# Patient Record
Sex: Male | Born: 1935 | Race: White | Hispanic: No | Marital: Married | Smoking: Former smoker
Health system: Southern US, Community
[De-identification: ages and names within clinical notes are randomized; demographics above are authoritative.]

## PROBLEM LIST (undated history)

## (undated) DIAGNOSIS — I10 Essential (primary) hypertension: Secondary | ICD-10-CM

## (undated) DIAGNOSIS — I219 Acute myocardial infarction, unspecified: Secondary | ICD-10-CM

## (undated) DIAGNOSIS — E7800 Pure hypercholesterolemia, unspecified: Secondary | ICD-10-CM

## (undated) DIAGNOSIS — C61 Malignant neoplasm of prostate: Secondary | ICD-10-CM

## (undated) DIAGNOSIS — D649 Anemia, unspecified: Secondary | ICD-10-CM

## (undated) DIAGNOSIS — C189 Malignant neoplasm of colon, unspecified: Secondary | ICD-10-CM

## (undated) DIAGNOSIS — M1990 Unspecified osteoarthritis, unspecified site: Secondary | ICD-10-CM

## (undated) DIAGNOSIS — C3490 Malignant neoplasm of unspecified part of unspecified bronchus or lung: Secondary | ICD-10-CM

## (undated) DIAGNOSIS — I509 Heart failure, unspecified: Secondary | ICD-10-CM

## (undated) HISTORY — PX: COLON SURGERY: SHX602

## (undated) HISTORY — PX: CORONARY ANGIOPLASTY WITH STENT PLACEMENT: SHX49

## (undated) HISTORY — PX: OTHER SURGICAL HISTORY: SHX169

## (undated) HISTORY — DX: Malignant neoplasm of unspecified part of unspecified bronchus or lung: C34.90

---

## 2004-08-20 ENCOUNTER — Other Ambulatory Visit: Payer: Self-pay

## 2004-08-20 ENCOUNTER — Emergency Department: Payer: Self-pay | Admitting: Internal Medicine

## 2004-09-29 ENCOUNTER — Encounter: Payer: Self-pay | Admitting: Cardiology

## 2004-10-16 ENCOUNTER — Encounter: Payer: Self-pay | Admitting: Cardiology

## 2004-11-15 ENCOUNTER — Encounter: Payer: Self-pay | Admitting: Cardiology

## 2004-12-16 ENCOUNTER — Encounter: Payer: Self-pay | Admitting: Cardiology

## 2005-05-04 ENCOUNTER — Ambulatory Visit: Payer: Self-pay | Admitting: Unknown Physician Specialty

## 2005-09-14 ENCOUNTER — Ambulatory Visit: Payer: Self-pay | Admitting: Internal Medicine

## 2005-11-02 ENCOUNTER — Ambulatory Visit: Payer: Self-pay | Admitting: Internal Medicine

## 2007-05-01 ENCOUNTER — Other Ambulatory Visit: Payer: Self-pay

## 2007-05-01 ENCOUNTER — Ambulatory Visit: Payer: Self-pay | Admitting: General Surgery

## 2007-05-05 ENCOUNTER — Ambulatory Visit: Payer: Self-pay | Admitting: General Surgery

## 2008-03-26 ENCOUNTER — Ambulatory Visit: Payer: Self-pay | Admitting: Unknown Physician Specialty

## 2008-09-03 ENCOUNTER — Ambulatory Visit: Payer: Self-pay | Admitting: Internal Medicine

## 2008-11-18 ENCOUNTER — Ambulatory Visit: Payer: Self-pay | Admitting: Vascular Surgery

## 2008-11-28 ENCOUNTER — Ambulatory Visit: Payer: Self-pay | Admitting: Vascular Surgery

## 2008-12-04 ENCOUNTER — Inpatient Hospital Stay: Payer: Self-pay | Admitting: Vascular Surgery

## 2009-01-12 ENCOUNTER — Emergency Department: Payer: Self-pay | Admitting: Unknown Physician Specialty

## 2012-01-21 ENCOUNTER — Ambulatory Visit: Payer: Self-pay | Admitting: Cardiology

## 2012-08-10 ENCOUNTER — Ambulatory Visit: Payer: Self-pay | Admitting: Cardiology

## 2013-01-18 ENCOUNTER — Ambulatory Visit: Payer: Self-pay | Admitting: Internal Medicine

## 2013-03-08 ENCOUNTER — Ambulatory Visit: Payer: Self-pay | Admitting: Specialist

## 2013-03-22 ENCOUNTER — Ambulatory Visit: Payer: Self-pay | Admitting: Cardiothoracic Surgery

## 2013-04-15 ENCOUNTER — Ambulatory Visit: Payer: Self-pay | Admitting: Cardiothoracic Surgery

## 2013-05-28 ENCOUNTER — Ambulatory Visit: Payer: Self-pay | Admitting: Internal Medicine

## 2013-06-13 ENCOUNTER — Ambulatory Visit: Payer: Self-pay | Admitting: Specialist

## 2013-06-21 ENCOUNTER — Ambulatory Visit: Payer: Self-pay | Admitting: Specialist

## 2013-10-20 ENCOUNTER — Observation Stay: Payer: Self-pay

## 2013-10-20 LAB — BASIC METABOLIC PANEL
Anion Gap: 7 (ref 7–16)
BUN: 20 mg/dL — AB (ref 7–18)
Calcium, Total: 9.3 mg/dL (ref 8.5–10.1)
Chloride: 105 mmol/L (ref 98–107)
Co2: 25 mmol/L (ref 21–32)
Creatinine: 1.1 mg/dL (ref 0.60–1.30)
EGFR (Non-African Amer.): >60
Glucose: 136 mg/dL — ABNORMAL HIGH (ref 65–99)
Osmolality: 279 (ref 275–301)
POTASSIUM: 4.5 mmol/L (ref 3.5–5.1)
Sodium: 137 mmol/L (ref 136–145)

## 2013-10-20 LAB — URINALYSIS, COMPLETE
BACTERIA: NONE SEEN
Bilirubin,UR: NEGATIVE
Blood: NEGATIVE
Glucose,UR: 50 mg/dL (ref 0–75)
Ketone: NEGATIVE
Leukocyte Esterase: NEGATIVE
NITRITE: NEGATIVE
Ph: 6 (ref 4.5–8.0)
Protein: 30
RBC,UR: 1 /HPF (ref 0–5)
Specific Gravity: 1.025 (ref 1.003–1.030)
Squamous Epithelial: 1
WBC UR: 2 /HPF (ref 0–5)

## 2013-10-20 LAB — CBC WITH DIFFERENTIAL/PLATELET
BASOS ABS: 0 10*3/uL (ref 0.0–0.1)
Basophil %: 0.7 %
Eosinophil #: 0.1 10*3/uL (ref 0.0–0.7)
Eosinophil %: 1.8 %
HCT: 46.3 % (ref 40.0–52.0)
HGB: 15.5 g/dL (ref 13.0–18.0)
LYMPHS ABS: 1.2 10*3/uL (ref 1.0–3.6)
Lymphocyte %: 17.2 %
MCH: 33.5 pg (ref 26.0–34.0)
MCHC: 33.5 g/dL (ref 32.0–36.0)
MCV: 100 fL (ref 80–100)
MONOS PCT: 10.7 %
Monocyte #: 0.8 x10 3/mm (ref 0.2–1.0)
NEUTROS ABS: 5 10*3/uL (ref 1.4–6.5)
Neutrophil %: 69.6 %
PLATELETS: 203 10*3/uL (ref 150–440)
RBC: 4.63 10*6/uL (ref 4.40–5.90)
RDW: 12.9 % (ref 11.5–14.5)
WBC: 7.1 10*3/uL (ref 3.8–10.6)

## 2013-10-20 LAB — CK TOTAL AND CKMB (NOT AT ARMC)
CK, TOTAL: 92 U/L
CK, TOTAL: 53 U/L
CK, Total: 54 U/L
CK-MB: 2.2 ng/mL (ref 0.5–3.6)
CK-MB: 2.4 ng/mL (ref 0.5–3.6)
CK-MB: 2.7 ng/mL (ref 0.5–3.6)

## 2013-10-20 LAB — TROPONIN I
Troponin-I: <0.02 ng/mL
Troponin-I: 0.02 ng/mL

## 2013-10-21 LAB — BASIC METABOLIC PANEL
ANION GAP: 8 (ref 7–16)
BUN: 21 mg/dL — ABNORMAL HIGH (ref 7–18)
CALCIUM: 8.5 mg/dL (ref 8.5–10.1)
Chloride: 108 mmol/L — ABNORMAL HIGH (ref 98–107)
Co2: 26 mmol/L (ref 21–32)
Creatinine: 1.3 mg/dL (ref 0.60–1.30)
EGFR (Non-African Amer.): 50 — ABNORMAL LOW
GFR CALC AF AMER: 58 — AB
Glucose: 92 mg/dL (ref 65–99)
OSMOLALITY: 286 (ref 275–301)
Potassium: 3.4 mmol/L — ABNORMAL LOW (ref 3.5–5.1)
SODIUM: 142 mmol/L (ref 136–145)

## 2013-10-21 LAB — LIPID PANEL
CHOLESTEROL: 92 mg/dL (ref 0–200)
HDL Cholesterol: 28 mg/dL — ABNORMAL LOW (ref 40–60)
Ldl Cholesterol, Calc: 43 mg/dL (ref 0–100)
Triglycerides: 104 mg/dL (ref 0–200)
VLDL Cholesterol, Calc: 21 mg/dL (ref 5–40)

## 2013-10-21 LAB — TSH: Thyroid Stimulating Horm: 0.435 u[IU]/mL — ABNORMAL LOW

## 2013-10-21 LAB — HEMOGLOBIN A1C: HEMOGLOBIN A1C: 6.3 % (ref 4.2–6.3)

## 2013-11-28 ENCOUNTER — Ambulatory Visit: Payer: Self-pay | Admitting: Specialist

## 2013-12-24 ENCOUNTER — Ambulatory Visit: Payer: Self-pay | Admitting: Vascular Surgery

## 2014-01-17 ENCOUNTER — Ambulatory Visit: Payer: Self-pay | Admitting: Cardiothoracic Surgery

## 2014-01-23 ENCOUNTER — Ambulatory Visit: Payer: Self-pay | Admitting: Cardiothoracic Surgery

## 2014-01-24 LAB — COMPREHENSIVE METABOLIC PANEL
ALK PHOS: 73 U/L
Albumin: 3.5 g/dL (ref 3.4–5.0)
Anion Gap: 10 (ref 7–16)
BUN: 24 mg/dL — AB (ref 7–18)
Bilirubin,Total: 0.6 mg/dL (ref 0.2–1.0)
Calcium, Total: 8.9 mg/dL (ref 8.5–10.1)
Chloride: 106 mmol/L (ref 98–107)
Co2: 27 mmol/L (ref 21–32)
Creatinine: 1.19 mg/dL (ref 0.60–1.30)
EGFR (African American): >60
EGFR (Non-African Amer.): >60
Glucose: 105 mg/dL — ABNORMAL HIGH (ref 65–99)
Osmolality: 289 (ref 275–301)
Potassium: 3.9 mmol/L (ref 3.5–5.1)
SGOT(AST): 19 U/L (ref 15–37)
SGPT (ALT): 25 U/L
Sodium: 143 mmol/L (ref 136–145)
Total Protein: 6.8 g/dL (ref 6.4–8.2)

## 2014-01-24 LAB — CBC CANCER CENTER
Basophil #: 0.1 x10 3/mm (ref 0.0–0.1)
Basophil %: 1 %
EOS ABS: 0.3 x10 3/mm (ref 0.0–0.7)
Eosinophil %: 3.9 %
HCT: 42.7 % (ref 40.0–52.0)
HGB: 14.3 g/dL (ref 13.0–18.0)
LYMPHS ABS: 1.5 x10 3/mm (ref 1.0–3.6)
Lymphocyte %: 21 %
MCH: 33.1 pg (ref 26.0–34.0)
MCHC: 33.5 g/dL (ref 32.0–36.0)
MCV: 99 fL (ref 80–100)
Monocyte #: 0.8 x10 3/mm (ref 0.2–1.0)
Monocyte %: 12 %
NEUTROS ABS: 4.4 x10 3/mm (ref 1.4–6.5)
Neutrophil %: 62.1 %
PLATELETS: 218 x10 3/mm (ref 150–440)
RBC: 4.33 10*6/uL — ABNORMAL LOW (ref 4.40–5.90)
RDW: 13.2 % (ref 11.5–14.5)
WBC: 7 x10 3/mm (ref 3.8–10.6)

## 2014-01-24 LAB — APTT: Activated PTT: 43.2 secs — ABNORMAL HIGH (ref 23.6–35.9)

## 2014-01-24 LAB — PROTIME-INR
INR: 1
Prothrombin Time: 13.5 secs (ref 11.5–14.7)

## 2014-01-31 ENCOUNTER — Ambulatory Visit: Payer: Self-pay | Admitting: Cardiothoracic Surgery

## 2014-02-15 ENCOUNTER — Ambulatory Visit: Payer: Self-pay | Admitting: Cardiothoracic Surgery

## 2014-03-18 ENCOUNTER — Ambulatory Visit: Payer: Self-pay | Admitting: Internal Medicine

## 2014-03-18 ENCOUNTER — Ambulatory Visit: Payer: Self-pay | Admitting: Cardiothoracic Surgery

## 2014-04-16 ENCOUNTER — Ambulatory Visit
Admit: 2014-04-16 | Disposition: A | Discharge status: Home / Self Care | Payer: Self-pay | Attending: Cardiothoracic Surgery | Admitting: Cardiothoracic Surgery

## 2014-06-08 NOTE — H&P (Signed)
PATIENT NAME:  Phillip Sanchez, DISANO MR#:  [id number] DATE OF BIRTH:  1927/08/03  DATE OF ADMISSION:  10/20/2013  PRIMARY CARE PROVIDER: Dr. Doy Hutching.   EMERGENCY DEPARTMENT REFERRING PHYSICIAN: Dr. Benjaman Lobe.   CHIEF COMPLAINT: Nausea, vomiting, dizziness, near syncope.   HISTORY OF PRESENT ILLNESS: The patient is an 81 year old white male with history of hypertension, hyperlipidemia, history of anemia, history of coronary artery disease, status post stent in 2006 who states that earlier this morning he got up and had some orange juice and subsequently started feeling very sick. He started feeling weak and then started seeing "stars." He also felt like he was going to pass out but he did not pass out. He states that during his previous heart attack he had similar symptoms, except this time he did not have chest pain. The patient came to the ED and received Zofran x 1 and then after that he started feeling better. Now he does not have any nausea or vomiting. He denies any chest pain or shortness of breath. Prior to this episode today, he has been feeling well without any chest pain, shortness of breath. No palpitations. No syncope. No nausea, vomiting, diarrhea. No urinary symptoms.   PAST MEDICAL HISTORY: Significant for:  1.  Hypertension.  2.  Hyperlipidemia.  3.  History of pulmonary nodule.  4.  History of anemia.  5.  History of osteoarthritis.  6.  Coronary artery disease with MI with stent to 2006. Has apparently a chronic occlusion that was not able to be stented.  7.  History of colon cancer status post resection.   PAST SURGICAL HISTORY:  1.  Status post ruptured disk surgery x 2.  2.  Carotid endarterectomy of the left status post hernia surgery.  3.  Status post colon resection.  4.  History of prostate surgery.   ALLERGIES: None.   CURRENT MEDICATIONS AT HOME: He is on Zyrtec 10 daily, ropinirole 1 mg at bedtime, ranitidine 300 daily, Plavix 75 p.o. daily, Peri-Colace 1 tab p.o.  b.i.d., nitroglycerin 0.4 sublingual p.r.n., Lotrel 10/40 one tab p.o. daily, hydrochlorothiazide 25 p.o. daily, Crestor 10 daily, Centrum Silver 1 tab p.o. daily, carvedilol 25 mg 1 tab p.o. b.i.d., calcium carbonate 600 one tab p.o. daily, aspirin 81 mg 1 tab p.o. daily, Ambien 10 at bedtime.   SOCIAL HISTORY: A history of smoking, quit 30 years ago. No alcohol or drug use.   FAMILY HISTORY: Positive for hypertension.   REVIEW OF SYSTEMS: CONSTITUTIONAL: Denies any fevers. Was weak earlier. No weight loss or weight gain.  EYES: No blurred or double vision. No redness. No inflammation.  ENT: No tinnitus. No ear pain. No hearing loss. No seasonal or year-round allergies. No epistaxis. No difficulty swallowing.  RESPIRATORY: Denies any coughing, wheezing. No hemoptysis. No COPD. No TB.  CARDIOVASCULAR: Denies any chest pain, orthopnea, edema. No palpitation. No syncope.  GASTROINTESTINAL: Was nauseous and throwing up earlier, but none now. No abdominal pain. No hematemesis. No melena.  GENITOURINARY: Denies any dysuria, hematuria, renal calculus, or frequency.  ENDOCRINE: Denies any polyuria, nocturia, or thyroid problems.  HEMATOLOGIC AND LYMPHATIC: Denies anemia, easy bruisability, or bleeding.  SKIN: No acne. No rash.  MUSCULOSKELETAL: No pain in the neck, back, or shoulder. There is no numbness, CVA, TIA, seizures.  PSYCHIATRIC: No anxiety, insomnia, or ADD.   PHYSICAL EXAMINATION: VITAL SIGNS: Temperature 97.7, pulse 61, respirations 18, blood pressure 161/74, O2 97%.  GENERAL: Well-developed, well-nourished male in no acute distress.  HEENT: Head atraumatic,  normocephalic. Pupils equally round, reactive to light and accommodation. There is no conjunctival pallor. No scleral icterus. Nasal exam shows no drainage or ulceration. Oropharynx clear without any exudate.  NECK: Supple without any JVD.  CARDIOVASCULAR: Regular rate and rhythm. No murmurs, rubs, clicks, gallops.  LUNGS: Clear to  auscultation bilaterally without any rales, rhonchi, wheezing.  ABDOMEN: Soft, nontender, nondistended. Positive bowel sounds x 4. No hepatosplenomegaly.  EXTREMITIES: No clubbing, cyanosis, or edema.  SKIN: No rash.  LYMPHATICS: No lymph nodes palpable.  VASCULAR: Good DP/PT pulses.  PSYCHIATRIC: Not anxious or depressed.   EVALUATIONS: CT scan of the head showed mild atrophy and microvascular ischemic disease without acute intracranial processes. Chest x-ray PA and lateral shows findings suggestive of airway disease, bronchitis. No pneumonia, pulmonary nodule. EKG shows left bundle branch block. Glucose 136, BUN 20, creatinine 1.10, sodium 137, potassium 4.5, chloride 105, CO2 of 25, calcium 9.3. Troponin less than 0.02. WBC 7.1, hemoglobin 15.5, platelet count 203,000.   ASSESSMENT AND PLAN: The patient is an 81 year old white male with history of coronary artery disease who presents with nausea, vomiting, near-syncope.  1.  Nausea, vomiting, dizziness, possibly due to gastroenteritis. However, the patient reports similar type of presentation prior to his myocardial infarction. He has a left bundle branch block, which is unclear if this is old or new. No old EKG to compare. At this time, we will continue his aspirin and Plavix, do serial cardiac enzymes, will ambulate, will have cardiology evaluate the patient. Likely discharge tomorrow.  2.  Coronary artery disease. Continue Plavix, aspirin, Coreg as taking at home.  3.  Hypertension. Continue amlodipine, benazepril, hydrochlorothiazide. I will check orthostatic blood pressure on this patient.  4.  Hyperlipidemia. Continue atorvastatin.  5.  Miscellaneous. The patient will be on Lovenox for deep vein thrombosis prophylaxis.   TIME SPENT ON THIS PATIENT: 50 minutes.    ____________________________ Lafonda Mosses Posey Pronto, MD shp:at D: 10/20/2013 16:48:44 ET T: 10/20/2013 18:14:49 ET JOB#: [id number]  cc: Tommey Barret H. Posey Pronto, MD,  <Dictator> Alric Seton MD ELECTRONICALLY SIGNED 11/02/2013 8:36

## 2014-06-08 NOTE — Discharge Summary (Signed)
PATIENT NAME:  Phillip Sanchez, Phillip Sanchez MR#:  [id number] DATE OF BIRTH:  [birth year]  DATE OF ADMISSION:  10/20/2013 DATE OF DISCHARGE:  10/21/2013  PRIMARY CARE PROVIDER: Fulton Reek, MD.   DISCHARGE DIAGNOSES:  Nausea, vomiting, dizziness attributed to dyspepsia.   HISTORY OF PRESENT ILLNESS: This is an 81 year old male with a history of hypertension, hyperlipidemia, coronary artery disease, who presented after an episode of acute onset nausea and diaphoresis. The patient states episode occurred after he drank juice with his morning medications and then lay back in his recliner. Diaphoresis was severe and his wife brought him to the ED. The nausea lasted several hours. He received Zofran in the ED which improved his symptoms.  Please see the H and P for further details.   HOSPITAL COURSE: Initial EKG notable for a left bundle branch block. CT head showed mild atrophy, microvascular ischemic disease without  acute intracranial process. Chest x-ray showed findings suggestive of airway disease, bronchitis, which did not fit his clinical symptoms. No pneumonia or pulmonary nodules. Initial cardiac enzymes were normal. The patient was placed on telemetry and monitored overnight. Serial cardiac enzymes remained normal. The patient had no further similar episodes and the following morning was felt safe for discharge. He was advised to continue all outpatient medications. No medication changes were made. He was advised to follow up with his primary care physician in 1-2 weeks. His discharge medications should match his admission medications, although the patient was not precise in remembering names of all medicines.   DISCHARGE MEDICATIONS:  1.  Zyrtec 10 mg daily.  2.  Ropinirole  1 mg at bedtime.  3.  Ranitidine 300 mg daily.  4.  Plavix 75 mg daily.  5.  Nitroglycerine 0.4 sublingual p.r.n.  6.  Lotrel 10/40 one tablet daily.  7.  HCTZ 25 mg daily.  8.  Crestor 10 mg daily.  9.  Multivitamin 1 tablet  daily.  10. Carvedilol 25 mg b.i.d.  11. Calcium carbonate 600 mg daily.  12. Aspirin 81 mg daily.  13. Ambien 10 mg at bedtime as needed    ____________________________ A. Lavone Orn, MD ams:lt D: 10/21/2013 10:23:17 ET T: 10/21/2013 11:14:53 ET JOB#: [id number]  cc: A. Lavone Orn, MD, <Dictator>  Fulton Reek, MD.  Gracy Bruins Tameika Heckmann MD ELECTRONICALLY SIGNED 10/24/2013 17:40

## 2014-06-08 NOTE — Consult Note (Signed)
Reason for Visit: This 81 year old Male patient presents to the clinic for initial evaluation of  lung cancer .   Referred by Dr. Faith Rogue.  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  Pathology Report pathology report reviewed   Imaging Report CT scans and PET CT scan reviewed   Referral Report clinical notes reviewed   Planned Treatment Regimen SB RT   HPI   patient is a pleasant 81 year old male with multiple comorbidities who had a CT scan of his chest showing a superior segment left lower lobe mass approximate 1.5 cm. Scan was worrisome for malignancy went on to have a CT-guided needle biopsy positive for adenocarcinoma.patient has decided against surgical option and is seen today for consideration of radiation treatments. He is doing well. He would be physically unaware of the lesion in his chest if not for the CT findings. He specifically denies cough hemoptysis or chest tightness. He overall is in excellent physical condition.  Past Hx:    Hypercholesterolemia:    HTN:    Pulmonary Nodule:    Anemia:    Osteoarthritis:    MI:    ruptured disc x 2:    carotid endart:    cardiac stent:    lumbar surgery:    cervical surgery:    hernia:    colon surgery:    prostate:   Past, Family and Social History:  Past Medical History positive   Cardiovascular carotid endarterectomy; coronary artery disease; coronary artery stents; hyperlipidemia; hypertension; myocardial infarction   Gastrointestinal colon cancer status post resection   Past Surgical History lumbar and cervical surgery, herniorrhaphy repair, colon surgery   Past Medical History Comments anemia, osteoarthritis, ruptured disc ??2   Family History positive   Family History Comments family history of hypertension   Social History positive   Social History Comments 30-pack-year smoking history quit 30 years prior  no EtOH use history   Additional Past Medical and Surgical History accompanied by his son today   Allergies:   No Known Allergies:   Home Meds:  Home Medications: Medication Instructions Status  hydrochlorothiazide 25 mg oral tablet 1 tab(s) orally once a day Active  amLODIPine-benazepril 10 mg-40 mg oral capsule 1 cap(s) orally once a day Active  Aspirin Enteric Coated 81 mg oral delayed release tablet 1 tab(s) orally once a day Active  calcium carbonate 500 mg oral tablet, chewable 1 tab(s) orally once a day Active  carvedilol 25 mg oral tablet 1 tab(s) orally 2 times a day (with meals) Active  clopidogrel 75 mg oral tablet 1 tab(s) orally once a day Active  fluticasone nasal 50 mcg/inh nasal spray 2 spray(s) into both nostrils once a day Active  levocetirizine 5 mg oral tablet 1 tab(s) orally once a day (in the evening) Active  multivitamin 1 tab(s) orally once a day Active  ranitidine 300 mg oral capsule 1 cap(s) orally once a day (at bedtime) Active  rOPINIRole 1 mg oral tablet 1 tab(s) orally once a day (at bedtime) Active   Review of Systems:  General negative   Performance Status (ECOG) 0   Skin negative   Breast negative   Ophthalmologic negative   ENMT negative   Respiratory and Thorax see HPI   Cardiovascular negative   Gastrointestinal negative   Genitourinary negative   Musculoskeletal negative   Neurological negative   Psychiatric negative   Hematology/Lymphatics negative   Endocrine negative  Allergic/Immunologic negative   Review of Systems   denies any weight loss, fatigue, weakness, fever, chills or night sweats. Patient denies any loss of vision, blurred vision. Patient denies any ringing  of the ears or hearing loss. No irregular heartbeat. Patient denies heart murmur or history of fainting. Patient denies any chest pain or pain radiating to her upper extremities. Patient denies any shortness of breath, difficulty breathing at night, cough  or hemoptysis. Patient denies any swelling in the lower legs. Patient denies any nausea vomiting, vomiting of blood, or coffee ground material in the vomitus. Patient denies any stomach pain. Patient states has had normal bowel movements no significant constipation or diarrhea. Patient denies any dysuria, hematuria or significant nocturia. Patient denies any problems walking, swelling in the joints or loss of balance. Patient denies any skin changes, loss of hair or loss of weight. Patient denies any excessive worrying or anxiety or significant depression. Patient denies any problems with insomnia. Patient denies excessive thirst, polyuria, polydipsia. Patient denies any swollen glands, patient denies easy bruising or easy bleeding. Patient denies any recent infections, allergies or URI. Patient "s visual fields have not changed significantly in recent time.   Nursing Notes:  Nursing Vital Signs and Chemo Nursing Nursing Notes: *CC Vital Signs Flowsheet:   28-Dec-15 09:05  Temp Temperature 95.1  Pulse Pulse 71  Respirations Respirations 18  SBP SBP 121  DBP DBP 66  Pain Scale (0-10)  0  Current Weight (kg) (kg) 86.5   Physical Exam:  General/Skin/HEENT:  General normal   Skin normal   Eyes normal   ENMT normal   Head and Neck normal   Additional PE well-developed well-nourished male in NAD. No cervical or supraclavicular adenopathy is appreciated. Lungs are clear to A&P cardiac examination shows regular rate and rhythm. Abdomen is benign.   Breasts/Resp/CV/GI/GU:  Respiratory and Thorax normal   Cardiovascular normal   Gastrointestinal normal   Genitourinary normal   MS/Neuro/Psych/Lymph:  Musculoskeletal normal   Neurological normal   Lymphatics normal   Other Results:  Radiology Results: CT:    14-Oct-15 10:58, CT Chest Without Contrast  CT Chest Without Contrast   REASON FOR EXAM:    follow up pulmonary nodules  COMMENTS:       PROCEDURE: KCT - KCT CHEST  WITHOUT CONTRAST  - Nov 28 2013 10:58AM     CLINICAL DATA:  Followup of pulmonary nodules. Colon cancer  diagnosed 8 years ago. Prostate cancer 23 years ago.    EXAM:  CT CHEST WITHOUT CONTRAST    TECHNIQUE:  Multidetector CT imaging of the chest was performed following the  standard protocol without IV contrast..  COMPARISON:  Plain film 10/20/2013. Chest CT 06/13/2013. PET  06/21/2013.    FINDINGS:  Lungs/Pleura: Mild centrilobular emphysema. Biapical pleural  parenchymal scarring. Somewhat more nodular component the left apex  is unchanged at 9 mm on image 11.    No change in thickening or sub pleural nodularity along the right  minor fissure.    Subsolid pulmonary nodule within the superior segment left lower  lobe. Measures 1.0 x 1.6 cm on image 28. 0.9 x 1.6 cm on the prior  exam. On coronal image 117, measures 1.2 cm, unchanged. Solid  component appears to measure on the order of 9 mm on coronal image  117.    No pleural fluid.    Heart/Mediastinum: No supraclavicular adenopathy. Aortic and branch  vessel atherosclerosis. Mild cardiomegaly with lipomatous  hypertrophy of the interatrial septum.  Multivessel coronary artery  atherosclerosis. Trace pericardial fluid or thickening, unchanged  and likely physiologic. No mediastinal or definite hilar adenopathy,  given limitations of unenhanced CT.    Upper Abdomen: Right hepatic lobe low-density lesions which are  likely cysts. Normal imaged portions of the gallbladder, spleen,  stomach, pancreas, adrenal glands, right kidney.  Bones/Musculoskeletal:  No acute osseous abnormality.     IMPRESSION:  1. Similar size and morphology of the superior segment left lower  lobe subsolid nodule.This remains indeterminate. Low-grade  adenocarcinoma cannot be excluded. Per consensus criteria, biopsy or  surgical resection should be considered. If this is not performed,  follow-up CT at 1 year is recommended. This recommendation  follows  the consensus statement: Recommendations for the Management of  Subsolid Pulmonary Nodules Detected at CT: A Statement from the  [place]. Radiology 2706;[id number].  2.  Atherosclerosis, including within the coronary arteries.      Electronically Signed    By: Abigail Miyamoto M.D.    On: 11/28/2013 14:08         Verified By: Areta Haber, M.D.,  Nuclear Med:    09-Dec-15 11:36, PET/CT Scan Lung Cancer Diagnosis  PET/CT Scan Lung Cancer Diagnosis   REASON FOR EXAM:    Pulmonary Nodule  COMMENTS:       PROCEDURE: PET - PET/CT DX LUNG CA  - Jan 23 2014 11:36AM     CLINICAL DATA:  Initial treatment strategy for solitary pulmonary  nodule.    EXAM:  NUCLEAR MEDICINE PET SKULL BASE TO THIGH    TECHNIQUE:  13.06 mCi F-18 FDG was injected intravenously. Full-ring PET imaging  was performed from the skull base to thigh after the radiotracer. CT  data was obtained and used for attenuation correction and anatomic  localization.    FASTING BLOOD GLUCOSE:  Value: 101 mg/dl    COMPARISON:  CT chest dated 11/28/2013.  PET-CT dated 06/21/2013.    FINDINGS:  NECK    No hypermetabolic lymph nodes in the neck.    CHEST    10 x 12 mm ground-glass nodule in the superior segment left lower  lobe (series 3/image 85), better visualized on dedicated CT chest,  max SUV 2.0 (previously max SUV 1.4). This appearance is worrisome  for primary bronchogenic neoplasm such as low-grade adenocarcinoma.    Additional 8 mm ground-glass nodule in the left lung apex (series  3/image 60), without convincing hypermetabolism.    Scattered patchy opacities/mosaic attenuation, upper lobe  predominant.    Paraseptal emphysematous changes.    No suspicious thoracic lymphadenopathy.    Cardiomegaly. Coronary atherosclerosis. Atherosclerotic  calcifications of the aortic arch.  ABDOMEN/PELVIS    No abnormal hypermetabolic activity within the liver, pancreas,  adrenal glands, or  spleen.    No hypermetabolic lymph nodes in the abdomen or pelvis.    Vascular calcifications. Postsurgical changes in the left inguinal  region. Status post prostatectomy with pelvic lymph node dissection.    SKELETON    No focal hypermetabolic activity to suggest skeletal metastasis.     IMPRESSION:  12 mm ground-glass nodule in the superior segment left lower lobe  with mild hypermetabolism, worrisome for primary bronchogenic  neoplasm such as a low-grade adenocarcinoma.    Thoracic surgery consultation is suggested.      Electronically Signed    By: Julian Hy M.D.    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 Would plan on delivering 5000 cGy in 5 fractions. Risks and benefits of treatment including sacrifice of some normal lung volume, fatigue, possible alteration of blood counts, and possible cough all were explained in detail to the patient. I have set him up for CT simulation with MIP protocol.CT simulation was ordered for shortly after the holiday weekend. Patient and son both seem to comprehend our treatment plan well.  I would like to take this opportunity for allowing me to participate in the care of your patient..  Fax to Physician:  Physicians To Recieve Fax: Idelle Crouch, MD - [phone] Erby Pian - [phone].  Electronic Signatures: Lezette Kitts, Roda Shutters (MD)  (Signed 28-Dec-15 13:30)  Authored: HPI, Diagnosis, Past Hx, PFSH, Allergies, Home Meds, ROS, Nursing Notes, Physical Exam, Other Results, Relevent Results, Encounter Assessment and Plan, Fax to Physician   Last Updated: 28-Dec-15 13:30 by  Armstead Peaks (MD)

## 2014-06-10 LAB — SURGICAL PATHOLOGY

## 2014-08-14 ENCOUNTER — Other Ambulatory Visit: Payer: Self-pay | Admitting: *Deleted

## 2014-08-14 DIAGNOSIS — Z85118 Personal history of other malignant neoplasm of bronchus and lung: Secondary | ICD-10-CM

## 2014-08-26 ENCOUNTER — Ambulatory Visit: Payer: Self-pay | Admitting: Radiation Oncology

## 2014-08-26 ENCOUNTER — Ambulatory Visit
Admission: RE | Admit: 2014-08-26 | Discharge: 2014-08-26 | Disposition: A | Discharge status: Home / Self Care | Payer: Medicare Other | Source: Ambulatory Visit | Attending: Radiation Oncology | Admitting: Radiation Oncology

## 2014-08-26 DIAGNOSIS — Z85118 Personal history of other malignant neoplasm of bronchus and lung: Secondary | ICD-10-CM | POA: Insufficient documentation

## 2014-08-26 DIAGNOSIS — I2510 Atherosclerotic heart disease of native coronary artery without angina pectoris: Secondary | ICD-10-CM | POA: Insufficient documentation

## 2014-08-26 DIAGNOSIS — R911 Solitary pulmonary nodule: Secondary | ICD-10-CM | POA: Diagnosis not present

## 2014-08-26 HISTORY — DX: Essential (primary) hypertension: I10

## 2014-08-26 HISTORY — DX: Heart failure, unspecified: I50.9

## 2014-08-26 MED ORDER — IOHEXOL 300 MG/ML  SOLN
75.0000 mL | Freq: Once | INTRAMUSCULAR | Status: AC | PRN
  Administered 2014-08-26: 75 mL via INTRAVENOUS

## 2014-08-28 ENCOUNTER — Ambulatory Visit
Admission: RE | Admit: 2014-08-28 | Discharge: 2014-08-28 | Disposition: A | Discharge status: Home / Self Care | Payer: Medicare Other | Source: Ambulatory Visit | Attending: Radiation Oncology | Admitting: Radiation Oncology

## 2014-08-28 ENCOUNTER — Encounter: Payer: Self-pay | Admitting: Radiation Oncology

## 2014-08-28 VITALS — BP 142/74 | HR 71 | Resp 18 | Wt 197.4 lb

## 2014-08-28 DIAGNOSIS — Z85118 Personal history of other malignant neoplasm of bronchus and lung: Secondary | ICD-10-CM

## 2014-08-28 NOTE — Progress Notes (Signed)
Radiation Oncology Follow up Note  Name: ANKUR SNOWDON   Date:   08/28/2014 MRN:  [id number] DOB: [birth year]    This 81 y.o. male presents to the clinic today for follow-up for stage I adenocarcinoma the left lower lobe status post SB RT.  REFERRING PROVIDER: No ref. provider found  HPI: patient is a 81 year old male now out 5 months having completed SB RT for a stage I adenocarcinoma the left lower lobe superior segment. Seen today in routine follow-up he is doing well. He specifically denies hemoptysis chest tightness or any change in his pulmonary status. Does have a dry hacking cough which has been persistent..he had a CT scan this week showing some radiation changes in the left lower lobe otherwise no evidence of disease  COMPLICATIONS OF TREATMENT: none  FOLLOW UP COMPLIANCE: keeps appointments   PHYSICAL EXAM:  BP 142/74 mmHg  Pulse 71  Resp 18  Wt 197 lb 6.8 oz (89.55 kg) Well-developed well-nourished patient in NAD. HEENT reveals PERLA, EOMI, discs not visualized.  Oral cavity is clear. No oral mucosal lesions are identified. Neck is clear without evidence of cervical or supraclavicular adenopathy. Lungs are clear to A&P. Cardiac examination is essentially unremarkable with regular rate and rhythm without murmur rub or thrill. Abdomen is benign with no organomegaly or masses noted. Motor sensory and DTR levels are equal and symmetric in the upper and lower extremities. Cranial nerves II through XII are grossly intact. Proprioception is intact. No peripheral adenopathy or edema is identified. No motor or sensory levels are noted. Crude visual fields are within normal range.   RADIOLOGY RESULTS: recent CT scan is reviewed  PLAN: patient continues to do well with CT scan showing no evidence of disease only some radiation changes in the left lower lobe. I'm please was overall progress. I've asked to see him back in 6 months for follow-up will obtain a CT scan at that time. Patient is  to call sooner with any concerns.  I would like to take this opportunity for allowing me to participate in the care of your patient.Armstead Peaks., MD

## 2015-02-28 ENCOUNTER — Other Ambulatory Visit: Payer: Self-pay | Admitting: *Deleted

## 2015-02-28 ENCOUNTER — Ambulatory Visit
Admission: RE | Admit: 2015-02-28 | Discharge: 2015-02-28 | Disposition: A | Discharge status: Home / Self Care | Payer: Medicare Other | Source: Ambulatory Visit | Attending: Radiation Oncology | Admitting: Radiation Oncology

## 2015-02-28 ENCOUNTER — Encounter: Payer: Self-pay | Admitting: Radiation Oncology

## 2015-02-28 VITALS — Temp 97.8°F | Resp 21 | Wt 197.3 lb

## 2015-02-28 DIAGNOSIS — C3432 Malignant neoplasm of lower lobe, left bronchus or lung: Secondary | ICD-10-CM

## 2015-02-28 NOTE — Progress Notes (Signed)
Patient states that he has an area under his right ribcage that has caused pain for the last couple weeks.

## 2015-02-28 NOTE — Progress Notes (Signed)
Radiation Oncology Follow up Note  Name: Phillip Sanchez   Date:   02/28/2015 MRN:  [id number] DOB: March 04, 1927    This 82 y.o. 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  REFERRING PROVIDER: Idelle Crouch, MD  HPI: Patient is a 82 year old male now out close to year having completed SB RT to his left lower lobe for stage I adenocarcinoma he is doing well specifically denies cough hemoptysis or chest tightness. States he occasionally has some sore ribs may be from occasional coughing.Marland Kitchen His last CT scan showed presumed radiation changes in left lower lobe.  COMPLICATIONS OF TREATMENT: none  FOLLOW UP COMPLIANCE: keeps appointments   PHYSICAL EXAM:  Temp(Src) 97.8 F (36.6 C) (Tympanic)  Resp 21  Wt 197 lb 5 oz (89.5 kg) Well-developed well-nourished patient in NAD. HEENT reveals PERLA, EOMI, discs not visualized.  Oral cavity is clear. No oral mucosal lesions are identified. Neck is clear without evidence of cervical or supraclavicular adenopathy. Lungs are clear to A&P. Cardiac examination is essentially unremarkable with regular rate and rhythm without murmur rub or thrill. Abdomen is benign with no organomegaly or masses noted. Motor sensory and DTR levels are equal and symmetric in the upper and lower extremities. Cranial nerves II through XII are grossly intact. Proprioception is intact. No peripheral adenopathy or edema is identified. No motor or sensory levels are noted. Crude visual fields are within normal range.  RADIOLOGY RESULTS: Previous CT scans are reviewed and compatible with the above-stated findings showing radiation changes in the left lower lobe  PLAN: At this time I've asked to see the patient back in about 3 months I've ordered a CT scan of the chest with contrast in 2 and half months and will review that with the patient at that time. Otherwise I'm please was overall progress. Patient knows to call  sooner with any concerns.  I would like to take this opportunity for allowing me to participate in the care of your patient.Phillip Sanchez., MD

## 2015-06-09 ENCOUNTER — Ambulatory Visit
Admission: RE | Admit: 2015-06-09 | Discharge: 2015-06-09 | Disposition: A | Discharge status: Home / Self Care | Payer: Medicare Other | Source: Ambulatory Visit | Attending: Radiation Oncology | Admitting: Radiation Oncology

## 2015-06-09 DIAGNOSIS — I2510 Atherosclerotic heart disease of native coronary artery without angina pectoris: Secondary | ICD-10-CM | POA: Insufficient documentation

## 2015-06-09 DIAGNOSIS — C3432 Malignant neoplasm of lower lobe, left bronchus or lung: Secondary | ICD-10-CM

## 2015-06-09 DIAGNOSIS — Z923 Personal history of irradiation: Secondary | ICD-10-CM | POA: Insufficient documentation

## 2015-06-09 DIAGNOSIS — R911 Solitary pulmonary nodule: Secondary | ICD-10-CM | POA: Insufficient documentation

## 2015-06-09 LAB — POCT I-STAT CREATININE: Creatinine, Ser: 1.1 mg/dL (ref 0.61–1.24)

## 2015-06-09 MED ORDER — IOPAMIDOL (ISOVUE-300) INJECTION 61%
75.0000 mL | Freq: Once | INTRAVENOUS | Status: AC | PRN
  Administered 2015-06-09: 75 mL via INTRAVENOUS

## 2015-06-20 ENCOUNTER — Other Ambulatory Visit: Payer: Self-pay | Admitting: *Deleted

## 2015-06-20 ENCOUNTER — Encounter: Payer: Self-pay | Admitting: Radiation Oncology

## 2015-06-20 ENCOUNTER — Ambulatory Visit
Admission: RE | Admit: 2015-06-20 | Discharge: 2015-06-20 | Disposition: A | Discharge status: Home / Self Care | Payer: Medicare Other | Source: Ambulatory Visit | Attending: Radiation Oncology | Admitting: Radiation Oncology

## 2015-06-20 VITALS — BP 133/75 | HR 67 | Temp 98.0°F | Wt 196.4 lb

## 2015-06-20 DIAGNOSIS — C3432 Malignant neoplasm of lower lobe, left bronchus or lung: Secondary | ICD-10-CM

## 2015-06-20 HISTORY — DX: Anemia, unspecified: D64.9

## 2015-06-20 HISTORY — DX: Pure hypercholesterolemia, unspecified: E78.00

## 2015-06-20 HISTORY — DX: Unspecified osteoarthritis, unspecified site: M19.90

## 2015-06-20 HISTORY — DX: Malignant neoplasm of unspecified part of unspecified bronchus or lung: C34.90

## 2015-06-20 HISTORY — DX: Acute myocardial infarction, unspecified: I21.9

## 2015-06-20 NOTE — Progress Notes (Signed)
Radiation Oncology Follow up Note  Name: Phillip Sanchez   Date:   06/20/2015 MRN:  [id number] DOB: 06-19-1927    This 82 y.o. male presents to the clinic today for follow-up for stage I adenocarcinoma the left lower lobe status post SB RT. Now out a year and a half  REFERRING PROVIDER: Idelle Crouch, MD  HPI: Patient is a 38 rolled male now out a year and a half having completed SB RT to his left lower lobe for stage I adenocarcinoma seen today in routine follow-up he is doing well. He had last week some left-sided chest pain evaluated at [facility] MI was ruled out thought to be shingles although he's had no skin eruption noted. Pain is gradually improving. He had a CT scan recently shows no evidence of disease with changes consistent with radiation therapy to his left lower lobe. He does have some fibrosis to his chest wall which may account for some of his chest pain.  COMPLICATIONS OF TREATMENT: none  FOLLOW UP COMPLIANCE: keeps appointments   PHYSICAL EXAM:  BP 133/75 mmHg  Pulse 67  Temp(Src) 98 F (36.7 C)  Wt 196 lb 6.9 oz (89.1 kg) Well-developed well-nourished patient in NAD. HEENT reveals PERLA, EOMI, discs not visualized.  Oral cavity is clear. No oral mucosal lesions are identified. Neck is clear without evidence of cervical or supraclavicular adenopathy. Lungs are clear to A&P. Cardiac examination is essentially unremarkable with regular rate and rhythm without murmur rub or thrill. Abdomen is benign with no organomegaly or masses noted. Motor sensory and DTR levels are equal and symmetric in the upper and lower extremities. Cranial nerves II through XII are grossly intact. Proprioception is intact. No peripheral adenopathy or edema is identified. No motor or sensory levels are noted. Crude visual fields are within normal range.  RADIOLOGY RESULTS: CT scans are reviewed compatible with above-stated findings.  PLAN: Present time patient is doing well I've assured him the chest  pain certainly may be related to some scar tissue secondary to his radiation affecting his chest wall and it should be intermittent and infrequent. I see no skin lesions in that area that would lead me to believe this is not herpes. I've asked to see the patient back in 6 months for follow-up will obtain another CT scan at that time for evaluation. Patient knows to call sooner with any concerns.  I would like to take this opportunity for allowing me to participate in the care of your patient.Armstead Peaks., MD

## 2016-01-15 ENCOUNTER — Ambulatory Visit
Admission: RE | Admit: 2016-01-15 | Discharge: 2016-01-15 | Disposition: A | Discharge status: Home / Self Care | Payer: Medicare Other | Source: Ambulatory Visit | Attending: Radiation Oncology | Admitting: Radiation Oncology

## 2016-01-15 DIAGNOSIS — I700 Atherosclerosis of aorta: Secondary | ICD-10-CM | POA: Insufficient documentation

## 2016-01-15 DIAGNOSIS — Y842 Radiological procedure and radiotherapy as the cause of abnormal reaction of the patient, or of later complication, without mention of misadventure at the time of the procedure: Secondary | ICD-10-CM | POA: Diagnosis not present

## 2016-01-15 DIAGNOSIS — C3432 Malignant neoplasm of lower lobe, left bronchus or lung: Secondary | ICD-10-CM

## 2016-01-15 DIAGNOSIS — I2510 Atherosclerotic heart disease of native coronary artery without angina pectoris: Secondary | ICD-10-CM | POA: Insufficient documentation

## 2016-01-15 LAB — POCT I-STAT CREATININE: Creatinine, Ser: 1.1 mg/dL (ref 0.61–1.24)

## 2016-01-15 MED ORDER — IOPAMIDOL (ISOVUE-300) INJECTION 61%
75.0000 mL | Freq: Once | INTRAVENOUS | Status: AC | PRN
  Administered 2016-01-15: 75 mL via INTRAVENOUS

## 2016-01-16 ENCOUNTER — Other Ambulatory Visit: Payer: Self-pay | Admitting: *Deleted

## 2016-01-16 ENCOUNTER — Encounter: Payer: Self-pay | Admitting: Radiation Oncology

## 2016-01-16 ENCOUNTER — Ambulatory Visit
Admission: RE | Admit: 2016-01-16 | Discharge: 2016-01-16 | Disposition: A | Discharge status: Home / Self Care | Payer: Medicare Other | Source: Ambulatory Visit | Attending: Radiation Oncology | Admitting: Radiation Oncology

## 2016-01-16 VITALS — BP 147/73 | HR 71 | Resp 22 | Wt 196.8 lb

## 2016-01-16 DIAGNOSIS — Z87891 Personal history of nicotine dependence: Secondary | ICD-10-CM | POA: Diagnosis not present

## 2016-01-16 DIAGNOSIS — Z923 Personal history of irradiation: Secondary | ICD-10-CM | POA: Insufficient documentation

## 2016-01-16 DIAGNOSIS — Z85118 Personal history of other malignant neoplasm of bronchus and lung: Secondary | ICD-10-CM | POA: Diagnosis not present

## 2016-01-16 DIAGNOSIS — C3432 Malignant neoplasm of lower lobe, left bronchus or lung: Secondary | ICD-10-CM | POA: Insufficient documentation

## 2016-01-16 NOTE — Progress Notes (Signed)
Radiation Oncology Follow up Note  Name: Phillip Sanchez   Date:   01/16/2016 MRN:  [id number] DOB: 12-25-1927    This 82 y.o. male presents to the clinic today for almost 2 year follow-up for SB RT to left lower lobe for stage I adenocarcinoma.  REFERRING PROVIDER: Idelle Crouch, MD  HPI: Patient is a 82 year old male now out close to 2 years having occluded SB RT to his left lower lobe for adenocarcinoma stage I. Seen today in routine follow up he is doing well. He specifically denies cough hemoptysis or chest tightness.. He had a CT scan of his chest this week showing stable disease with similar appearance of radiation changes within the posterior medial left lower lobe without evidence recurrent or metastatic disease.  COMPLICATIONS OF TREATMENT: none  FOLLOW UP COMPLIANCE: keeps appointments   PHYSICAL EXAM:  BP (!) 147/73   Pulse 71   Resp (!) 22   Wt 196 lb 12.2 oz (89.3 kg)  Well-developed well-nourished patient in NAD. HEENT reveals PERLA, EOMI, discs not visualized.  Oral cavity is clear. No oral mucosal lesions are identified. Neck is clear without evidence of cervical or supraclavicular adenopathy. Lungs are clear to A&P. Cardiac examination is essentially unremarkable with regular rate and rhythm without murmur rub or thrill. Abdomen is benign with no organomegaly or masses noted. Motor sensory and DTR levels are equal and symmetric in the upper and lower extremities. Cranial nerves II through XII are grossly intact. Proprioception is intact. No peripheral adenopathy or edema is identified. No motor or sensory levels are noted. Crude visual fields are within normal range.  RADIOLOGY RESULTS: CT scan is reviewed and compared to prior studies  PLAN: Present time patient is to do well with no evidence of disease excellent results by CT criteria. I'm please was overall progress. I've asked to see him back in 1 year for follow-up. Patient is to call sooner with any concerns.  I  would like to take this opportunity to thank you for allowing me to participate in the care of your patient.Armstead Peaks., MD

## 2016-11-18 ENCOUNTER — Other Ambulatory Visit: Payer: Self-pay | Admitting: Internal Medicine

## 2016-11-19 ENCOUNTER — Other Ambulatory Visit: Payer: Self-pay | Admitting: Internal Medicine

## 2016-11-19 DIAGNOSIS — R9389 Abnormal findings on diagnostic imaging of other specified body structures: Secondary | ICD-10-CM

## 2016-11-26 ENCOUNTER — Ambulatory Visit
Admission: RE | Admit: 2016-11-26 | Discharge: 2016-11-26 | Disposition: A | Discharge status: Home / Self Care | Payer: Medicare Other | Source: Ambulatory Visit | Attending: Internal Medicine | Admitting: Internal Medicine

## 2016-11-26 DIAGNOSIS — I2510 Atherosclerotic heart disease of native coronary artery without angina pectoris: Secondary | ICD-10-CM | POA: Insufficient documentation

## 2016-11-26 DIAGNOSIS — R9389 Abnormal findings on diagnostic imaging of other specified body structures: Secondary | ICD-10-CM | POA: Insufficient documentation

## 2016-11-26 DIAGNOSIS — I313 Pericardial effusion (noninflammatory): Secondary | ICD-10-CM | POA: Diagnosis not present

## 2016-11-26 DIAGNOSIS — I700 Atherosclerosis of aorta: Secondary | ICD-10-CM | POA: Diagnosis not present

## 2017-01-14 ENCOUNTER — Ambulatory Visit
Admission: RE | Admit: 2017-01-14 | Discharge: 2017-01-14 | Disposition: A | Discharge status: Home / Self Care | Payer: Medicare Other | Source: Ambulatory Visit | Attending: Radiation Oncology | Admitting: Radiation Oncology

## 2017-01-14 DIAGNOSIS — Z923 Personal history of irradiation: Secondary | ICD-10-CM | POA: Insufficient documentation

## 2017-01-14 DIAGNOSIS — I700 Atherosclerosis of aorta: Secondary | ICD-10-CM | POA: Insufficient documentation

## 2017-01-14 DIAGNOSIS — C3432 Malignant neoplasm of lower lobe, left bronchus or lung: Secondary | ICD-10-CM | POA: Diagnosis not present

## 2017-01-14 HISTORY — DX: Malignant neoplasm of prostate: C61

## 2017-01-14 HISTORY — DX: Malignant neoplasm of colon, unspecified: C18.9

## 2017-01-14 LAB — POCT I-STAT CREATININE: Creatinine, Ser: 1.2 mg/dL (ref 0.61–1.24)

## 2017-01-14 MED ORDER — IOPAMIDOL (ISOVUE-300) INJECTION 61%
75.0000 mL | Freq: Once | INTRAVENOUS | Status: AC | PRN
  Administered 2017-01-14: 75 mL via INTRAVENOUS

## 2017-01-20 ENCOUNTER — Ambulatory Visit
Admission: RE | Admit: 2017-01-20 | Discharge: 2017-01-20 | Disposition: A | Discharge status: Home / Self Care | Payer: Medicare Other | Source: Ambulatory Visit | Attending: Radiation Oncology | Admitting: Radiation Oncology

## 2017-01-20 ENCOUNTER — Other Ambulatory Visit: Payer: Self-pay

## 2017-01-20 ENCOUNTER — Encounter: Payer: Self-pay | Admitting: Radiation Oncology

## 2017-01-20 VITALS — BP 136/72 | HR 66 | Resp 20 | Wt 195.7 lb

## 2017-01-20 DIAGNOSIS — C3432 Malignant neoplasm of lower lobe, left bronchus or lung: Secondary | ICD-10-CM

## 2017-01-20 DIAGNOSIS — Z87891 Personal history of nicotine dependence: Secondary | ICD-10-CM | POA: Diagnosis not present

## 2017-01-20 DIAGNOSIS — Z923 Personal history of irradiation: Secondary | ICD-10-CM | POA: Insufficient documentation

## 2017-01-20 DIAGNOSIS — Z85118 Personal history of other malignant neoplasm of bronchus and lung: Secondary | ICD-10-CM | POA: Insufficient documentation

## 2017-01-20 NOTE — Progress Notes (Signed)
Radiation Oncology Follow up Note  Name: Phillip Sanchez   Date:   01/20/2017 MRN:  [id number] DOB: 02-Mar-1927    This 83 y.o. male presents to the clinic today for 3 year follow-up status post SB RT to his left lower lobe for stage I adenocarcinoma.  REFERRING PROVIDER: Idelle Crouch, MD  HPI: Patient is a 83 year old male now out 3 years having completed SB RT to his left lower lobe for stage I adenocarcinoma. He is doing well. He specifically denies cough hemoptysis chest tightness or any difficulty with his breathing.Marland Kitchen He had recently had a CT scan of the chest which I have reviewed showing stable post therapy atelectasis in the left lung without evidence of disease progression or recurrence.  COMPLICATIONS OF TREATMENT: none  FOLLOW UP COMPLIANCE: keeps appointments   PHYSICAL EXAM:  BP 136/72   Pulse 66   Resp 20   Wt 195 lb 10.5 oz (88.7 kg)  Well-developed well-nourished patient in NAD. HEENT reveals PERLA, EOMI, discs not visualized.  Oral cavity is clear. No oral mucosal lesions are identified. Neck is clear without evidence of cervical or supraclavicular adenopathy. Lungs are clear to A&P. Cardiac examination is essentially unremarkable with regular rate and rhythm without murmur rub or thrill. Abdomen is benign with no organomegaly or masses noted. Motor sensory and DTR levels are equal and symmetric in the upper and lower extremities. Cranial nerves II through XII are grossly intact. Proprioception is intact. No peripheral adenopathy or edema is identified. No motor or sensory levels are noted. Crude visual fields are within normal range.  RADIOLOGY RESULTS: CT scans reviewed  PLAN: At the present time patient continues to do well with excellent response by CT criteria. I'm please was overall progress. I've asked to see him back in 1 year for follow-up and will perform CT scan prior to that visit. Patient is to call at any time for any concerns.  I would like to take this  opportunity to thank you for allowing me to participate in the care of your patient.Armstead Peaks., MD

## 2017-07-18 ENCOUNTER — Other Ambulatory Visit: Payer: Self-pay

## 2017-07-18 ENCOUNTER — Telehealth: Payer: Self-pay | Admitting: Internal Medicine

## 2017-07-18 ENCOUNTER — Emergency Department
Admission: EM | Admit: 2017-07-18 | Discharge: 2017-07-18 | Disposition: A | Discharge status: Home / Self Care | Payer: Medicare Other | Attending: Emergency Medicine | Admitting: Emergency Medicine

## 2017-07-18 ENCOUNTER — Encounter: Payer: Self-pay | Admitting: Emergency Medicine

## 2017-07-18 ENCOUNTER — Emergency Department: Payer: Medicare Other

## 2017-07-18 DIAGNOSIS — Z8546 Personal history of malignant neoplasm of prostate: Secondary | ICD-10-CM | POA: Insufficient documentation

## 2017-07-18 DIAGNOSIS — R109 Unspecified abdominal pain: Secondary | ICD-10-CM | POA: Insufficient documentation

## 2017-07-18 DIAGNOSIS — C787 Secondary malignant neoplasm of liver and intrahepatic bile duct: Secondary | ICD-10-CM | POA: Diagnosis not present

## 2017-07-18 DIAGNOSIS — I110 Hypertensive heart disease with heart failure: Secondary | ICD-10-CM | POA: Diagnosis not present

## 2017-07-18 DIAGNOSIS — Z7902 Long term (current) use of antithrombotics/antiplatelets: Secondary | ICD-10-CM | POA: Insufficient documentation

## 2017-07-18 DIAGNOSIS — Z87891 Personal history of nicotine dependence: Secondary | ICD-10-CM | POA: Insufficient documentation

## 2017-07-18 DIAGNOSIS — Z85038 Personal history of other malignant neoplasm of large intestine: Secondary | ICD-10-CM | POA: Diagnosis not present

## 2017-07-18 DIAGNOSIS — R10816 Epigastric abdominal tenderness: Secondary | ICD-10-CM | POA: Diagnosis not present

## 2017-07-18 DIAGNOSIS — I509 Heart failure, unspecified: Secondary | ICD-10-CM | POA: Insufficient documentation

## 2017-07-18 DIAGNOSIS — Z7982 Long term (current) use of aspirin: Secondary | ICD-10-CM | POA: Insufficient documentation

## 2017-07-18 DIAGNOSIS — R10811 Right upper quadrant abdominal tenderness: Secondary | ICD-10-CM | POA: Diagnosis not present

## 2017-07-18 LAB — URINALYSIS, COMPLETE (UACMP) WITH MICROSCOPIC
BILIRUBIN URINE: NEGATIVE
GLUCOSE, UA: NEGATIVE mg/dL
Ketones, ur: NEGATIVE mg/dL
LEUKOCYTES UA: NEGATIVE
NITRITE: NEGATIVE
Protein, ur: 100 mg/dL — AB
SPECIFIC GRAVITY, URINE: 1.027 (ref 1.005–1.030)
pH: 5 (ref 5.0–8.0)

## 2017-07-18 LAB — CBC
HEMATOCRIT: 38.5 % — AB (ref 40.0–52.0)
Hemoglobin: 12.9 g/dL — ABNORMAL LOW (ref 13.0–18.0)
MCH: 34 pg (ref 26.0–34.0)
MCHC: 33.7 g/dL (ref 32.0–36.0)
MCV: 101.1 fL — AB (ref 80.0–100.0)
PLATELETS: 179 10*3/uL (ref 150–440)
RBC: 3.81 MIL/uL — AB (ref 4.40–5.90)
RDW: 14.1 % (ref 11.5–14.5)
WBC: 9.9 10*3/uL (ref 3.8–10.6)

## 2017-07-18 LAB — COMPREHENSIVE METABOLIC PANEL
ALT: 102 U/L — ABNORMAL HIGH (ref 17–63)
AST: 117 U/L — ABNORMAL HIGH (ref 15–41)
Albumin: 3.5 g/dL (ref 3.5–5.0)
Alkaline Phosphatase: 90 U/L (ref 38–126)
Anion gap: 10 (ref 5–15)
BUN: 20 mg/dL (ref 6–20)
CHLORIDE: 107 mmol/L (ref 101–111)
CO2: 22 mmol/L (ref 22–32)
CREATININE: 1.15 mg/dL (ref 0.61–1.24)
Calcium: 8.8 mg/dL — ABNORMAL LOW (ref 8.9–10.3)
GFR, EST NON AFRICAN AMERICAN: 55 mL/min — AB (ref >60)
Glucose, Bld: 164 mg/dL — ABNORMAL HIGH (ref 65–99)
POTASSIUM: 3.6 mmol/L (ref 3.5–5.1)
Sodium: 139 mmol/L (ref 135–145)
TOTAL PROTEIN: 6.6 g/dL (ref 6.5–8.1)
Total Bilirubin: 1 mg/dL (ref 0.3–1.2)

## 2017-07-18 LAB — LIPASE, BLOOD: LIPASE: 80 U/L — AB (ref 11–51)

## 2017-07-18 MED ORDER — OXYCODONE-ACETAMINOPHEN 5-325 MG PO TABS: 1.0000 | ORAL_TABLET | ORAL | 0 refills | Status: DC | PRN

## 2017-07-18 MED ORDER — DOCUSATE SODIUM 100 MG PO CAPS: 100.0000 mg | ORAL_CAPSULE | Freq: Every day | ORAL | 2 refills | Status: DC | PRN

## 2017-07-18 MED ORDER — IOPAMIDOL (ISOVUE-300) INJECTION 61%
100.0000 mL | Freq: Once | INTRAVENOUS | Status: AC | PRN
  Administered 2017-07-18: 100 mL via INTRAVENOUS

## 2017-07-18 MED ORDER — IOPAMIDOL (ISOVUE-300) INJECTION 61%
30.0000 mL | Freq: Once | INTRAVENOUS | Status: AC | PRN
  Administered 2017-07-18: 30 mL via ORAL

## 2017-07-18 MED ORDER — FENTANYL CITRATE (PF) 100 MCG/2ML IJ SOLN
50.0000 ug | Freq: Once | INTRAMUSCULAR | Status: AC
  Administered 2017-07-18: 50 ug via INTRAVENOUS
  Filled 2017-07-18: qty 2

## 2017-07-18 MED ORDER — SODIUM CHLORIDE 0.9 % IV BOLUS
500.0000 mL | Freq: Once | INTRAVENOUS | Status: AC
  Administered 2017-07-18: 500 mL via INTRAVENOUS

## 2017-07-18 NOTE — ED Notes (Signed)
Pt ambulatory upon discharge; declined wheel chair. Verbalized understanding of discharge instructions, follow-up care and prescriptions. VSS. Skin warm and dry. A&O x4.

## 2017-07-18 NOTE — Discharge Instructions (Addendum)
It appears, unfortunately, that you have cancer that has gone to your liver.  Please follow-up with the cancer center tomorrow.  I am giving a prescription for pain medication.  Only take it for severe pain, it can cause constipation.  Take the Colace as a stool softener, do not drink or drive or take Tylenol while using Percocet.  Only take it as needed.  It is imperative that you follow-up with the cancer center, please call them for an appointment first thing in the morning.  If you feel worse in any way including fever, vomiting, increased abdominal pain, or any other concerns return to the emergency department.

## 2017-07-18 NOTE — ED Notes (Signed)
Patient transported to CT.  Will complete ED EKG when pt returns to room.

## 2017-07-18 NOTE — Telephone Encounter (Signed)
Called by ER re: liver mets; pt needs to be seen asap;   Heather/collete- please check if pt can c ome and see

## 2017-07-18 NOTE — ED Triage Notes (Signed)
Abdominal pain and bloating x 2 weeks. Denies nausea or vomiting.

## 2017-07-18 NOTE — ED Provider Notes (Addendum)
[facility] Emergency Department Provider Note  ____________________________________________   I have reviewed the triage vital signs and the nursing notes. Where available I have reviewed prior notes and, if possible and indicated, outside hospital notes.    HISTORY  Chief Complaint Abdominal Pain    HPI Phillip Sanchez is a 84 y.o. male with a history of polypectomies in the past for precancerous colons, with no other therapy needed, history of CHF, anemia prostate cancer, MI, presents with diffuse abdominal pain for 5 weeks, the nagging discomfort, he feels it "everywhere".  He denies any fever or chills.  He has had some constipation that he took a laxative about a week and a half ago had some diarrhea thereafter and then he is back down to constipation again.  Last bowel movement was 3 days ago he thinks.  He denies any vomiting.  He does not have an appetite.  He has had decreased p.o. intake and thinks he is lost some weight.  The pain is a diffuse poorly described abdominal discomfort.  Level 5 chart caveat; no further history available due to patient status.  He cannot think of anything new or different that he may be doing that could cause this makes it better nothing makes it worse it seems to have no radiation but is mostly in the lower abdomen that he feels it.  No urinary symptoms.  Patient with no chest pain or exertional symptoms     Past Medical History:  Diagnosis Date  . Anemia   . CHF (congestive heart failure) ([place])   . Colon cancer [facility])    He states they removed cancerous polyps.  . Hypercholesteremia   . Hypertension   . Lung cancer ([place])   . Myocardial infarction ([place])   . Osteoarthritis   . Prostate cancer [facility])    Prostatectomy.    Patient Active Problem List   Diagnosis Date Noted  . Cancer of lower lobe of left lung ([place]) 01/16/2016    Past Surgical History:  Procedure Laterality Date  . COLON SURGERY    . CORONARY  ANGIOPLASTY WITH STENT PLACEMENT    . herniorrhaphy      Prior to Admission medications   Medication Sig Start Date End Date Taking? Authorizing Provider  amLODipine-benazepril (LOTREL) 10-40 MG per capsule TAKE ONE (1) CAPSULE EACH DAY 04/12/14   [provider]  aspirin EC 81 MG tablet Take by mouth.    [provider]  atorvastatin (LIPITOR) 20 MG tablet Take by mouth. 05/02/14   [provider]  calcium carbonate (OS-CAL - DOSED IN MG OF ELEMENTAL CALCIUM) 1250 (500 CA) MG tablet Take by mouth.    [provider]  carvedilol (COREG) 25 MG tablet Take by mouth. 04/12/14   [provider]  clopidogrel (PLAVIX) 75 MG tablet Take by mouth.    [provider]  fexofenadine-pseudoephedrine (ALLEGRA-D 24) 180-240 MG per 24 hr tablet Take by mouth.    [provider]  fluticasone (FLONASE) 50 MCG/ACT nasal spray Place into the nose.    [provider]  hydrochlorothiazide (HYDRODIURIL) 25 MG tablet TAKE ONE (1) TABLET EACH DAY AS DIRECTED 04/12/14   [provider]  levocetirizine (XYZAL) 5 MG tablet Take by mouth. 05/02/14   [provider]  Multiple Vitamin (MULTI-VITAMINS) TABS Take by mouth.    [provider]  ranitidine (ZANTAC) 300 MG capsule Take by mouth. 12/24/13   [provider]  rOPINIRole (REQUIP) 1 MG tablet  Take by mouth. 03/31/14   [provider]  temazepam (RESTORIL) 15 MG capsule  05/12/15   [provider]    Allergies Patient has no known allergies.  No family history on file.  Social History Social History   Tobacco Use  . Smoking status: Former Research scientist (life sciences)  . Smokeless tobacco: Never Used  Substance Use Topics  . Alcohol use: No    Alcohol/week: 0.0 oz  . Drug use: No    Review of Systems Constitutional: No fever/chills Eyes: No visual changes. ENT: No sore throat. No stiff neck no neck pain Cardiovascular: Denies chest pain. Respiratory:  Denies shortness of breath. Gastrointestinal:   no vomiting.  HPI regarding bowel movements Genitourinary: Negative for dysuria. Musculoskeletal: Negative lower extremity swelling Skin: Negative for rash. Neurological: Negative for severe headaches, focal weakness or numbness.   ____________________________________________   PHYSICAL EXAM:  VITAL SIGNS: ED Triage Vitals  Enc Vitals Group     BP 07/18/17 1456 (!) 155/83     Pulse Rate 07/18/17 1456 97     Resp 07/18/17 1456 20     Temp 07/18/17 1456 98.2 F (36.8 C)     Temp Source 07/18/17 1456 Oral     SpO2 07/18/17 1456 95 %     Weight 07/18/17 1457 185 lb (83.9 kg)     Height 07/18/17 1457 5\' 10"  (1.778 m)     Head Circumference --      Peak Flow --      Pain Score 07/18/17 1457 5     Pain Loc --      Pain Edu? --      Excl. in [place]? --     Constitutional: Alert and oriented. Well appearing and in no acute distress. Eyes: Conjunctivae are normal Head: Atraumatic HEENT: No congestion/rhinnorhea. Mucous membranes are moist.  Oropharynx non-erythematous Neck:   Nontender with no meningismus, no masses, no stridor Cardiovascular: Normal rate, regular rhythm. Grossly normal heart sounds.  Good peripheral circulation. Respiratory: Normal respiratory effort.  No retractions. Lungs CTAB. Abdominal:, Obese, diffusely widely tender, there is tenderness palpation in the epigastric right upper quadrant more than the lower abdomen but he does have some lower abdominal discomfort as well back:  There is no focal tenderness or step off.  there is no midline tenderness there are no lesions noted. there is no CVA tenderness Normal external nontender male genitalia Musculoskeletal: No lower extremity tenderness, no upper extremity tenderness. No joint effusions, no DVT signs strong distal pulses no edema Neurologic:  Normal speech and language. No gross focal neurologic deficits are appreciated.  Skin:  Skin is warm, dry and intact. No  rash noted. Psychiatric: Mood and affect are normal. Speech and behavior are normal.  ____________________________________________   LABS (all labs ordered are listed, but only abnormal results are displayed)  Labs Reviewed  LIPASE, BLOOD - Abnormal; Notable for the following components:      Result Value   Lipase 80 (*)    All other components within normal limits  COMPREHENSIVE METABOLIC PANEL - Abnormal; Notable for the following components:   Glucose, Bld 164 (*)    Calcium 8.8 (*)    AST 117 (*)    ALT 102 (*)    GFR calc non Af Amer 55 (*)    All other components within normal limits  CBC - Abnormal; Notable for the following components:   RBC 3.81 (*)    Hemoglobin 12.9 (*)    HCT 38.5 (*)  MCV 101.1 (*)    All other components within normal limits  URINALYSIS, COMPLETE (UACMP) WITH MICROSCOPIC - Abnormal; Notable for the following components:   Color, Urine AMBER (*)    APPearance HAZY (*)    Hgb urine dipstick SMALL (*)    Protein, ur 100 (*)    Bacteria, UA RARE (*)    All other components within normal limits    Pertinent labs  results that were available during my care of the patient were reviewed by me and considered in my medical decision making (see chart for details). ____________________________________________  EKG  I personally interpreted any EKGs ordered by me or triage  ____________________________________________  RADIOLOGY  Pertinent labs & imaging results that were available during my care of the patient were reviewed by me and considered in my medical decision making (see chart for details). If possible, patient and/or family made aware of any abnormal findings.  No results found. ____________________________________________    PROCEDURES  Procedure(s) performed: Sinus rhythm rate 89 bpm, left bundle branch block, old, PVCs noted, no acute ischemic changes  Procedures  Critical Care performed:       ____________________________________________   INITIAL IMPRESSION / ASSESSMENT AND PLAN / ED COURSE  Pertinent labs & imaging results that were available during my care of the patient were reviewed by me and considered in my medical decision making (see chart for details).  Patient here with diffuse abdominal pain for for 5 weeks, history of cancerous lesions in his abdomen last colonoscopy was 17 years ago.  Patient has diffuse abdominal pain is actually more tender in the epigastric right upper quadrant region however given diffuse complaints of abdominal pain and disruptions in his stooling and history of cancer will start with CT scan.  Blood work is otherwise reassuring, lipase and liver function tests are noted.  Abdomen is nonsurgical at this time.  Diffuse possible differential is been considered including diverticulitis gallbladder disease appendicitis ischemia etc.  We will see what the CT scan shows and reassess.  I will give him pain medications and given history of HF we will give him very ginger IV fluids as he states he has not been taking very much p.o. Recently.  Given timeframe, concern for cancer is present.  ----------------------------------------- 6:57 PM on 07/18/2017 ----------------------------------------- CT scan confirms concerns of oncologic process lipase is somewhat up liver function tests are little bit up and CT scan shows diffuse metastatic disease in the liver.  I did make the patient aware of these findings, the exact prognosis is not clear but is certainly not good.  I did offer admission to the hospital but he very much would prefer to go home, he has to take care of his wife who has some dementia apparently the patient does have a history of colon cancer 17 years ago or at least cancerous polyps as he describes it, and a lower lung stage I adenocarcinoma treated with SBRT.  Patient primary is likely a lung cancer although colon cancer cannot be ruled out  CT scans otherwise unremarkable blood work is reassuring otherwise, we are paging the cancer center, and is my hope that we can safely get him home at his strong preference.  ----------------------------------------- 7:27 PM on 07/18/2017 -----------------------------------------  Discussed with Dr. Rogue Bussing, we discussed the patient's finding on CT scan his blood work, his history etc.  Patient very adamant he does not wish to be admitted to the hospital.  We will send him home with pain medications, and  close outpatient follow-up with his oncologist.  They will call him tomorrow.  Patient aware of the need to follow-up and return precautions are given and understood.    ____________________________________________   FINAL CLINICAL IMPRESSION(S) / ED DIAGNOSES  Final diagnoses:  None      This chart was dictated using voice recognition software.  Despite best efforts to proofread,  errors can occur which can change meaning.      Schuyler Amor, MD 07/18/17 1714    Schuyler Amor, MD 07/18/17 1800    Schuyler Amor, MD 07/18/17 1900    Schuyler Amor, MD 07/18/17 [phone]

## 2017-07-19 ENCOUNTER — Encounter: Payer: Self-pay | Admitting: Internal Medicine

## 2017-07-19 ENCOUNTER — Inpatient Hospital Stay: Payer: Medicare Other

## 2017-07-19 ENCOUNTER — Inpatient Hospital Stay: Payer: Medicare Other | Attending: Internal Medicine | Admitting: Internal Medicine

## 2017-07-19 VITALS — BP 168/80 | HR 90 | Temp 97.6°F | Resp 20 | Ht 70.0 in | Wt 190.0 lb

## 2017-07-19 DIAGNOSIS — C3432 Malignant neoplasm of lower lobe, left bronchus or lung: Secondary | ICD-10-CM

## 2017-07-19 DIAGNOSIS — Z85038 Personal history of other malignant neoplasm of large intestine: Secondary | ICD-10-CM | POA: Diagnosis not present

## 2017-07-19 DIAGNOSIS — Z5111 Encounter for antineoplastic chemotherapy: Secondary | ICD-10-CM | POA: Insufficient documentation

## 2017-07-19 DIAGNOSIS — Z87891 Personal history of nicotine dependence: Secondary | ICD-10-CM | POA: Insufficient documentation

## 2017-07-19 DIAGNOSIS — K5909 Other constipation: Secondary | ICD-10-CM | POA: Diagnosis not present

## 2017-07-19 DIAGNOSIS — R7989 Other specified abnormal findings of blood chemistry: Secondary | ICD-10-CM | POA: Insufficient documentation

## 2017-07-19 DIAGNOSIS — R634 Abnormal weight loss: Secondary | ICD-10-CM | POA: Insufficient documentation

## 2017-07-19 DIAGNOSIS — I10 Essential (primary) hypertension: Secondary | ICD-10-CM

## 2017-07-19 DIAGNOSIS — R109 Unspecified abdominal pain: Secondary | ICD-10-CM | POA: Diagnosis not present

## 2017-07-19 DIAGNOSIS — M7989 Other specified soft tissue disorders: Secondary | ICD-10-CM | POA: Insufficient documentation

## 2017-07-19 DIAGNOSIS — R630 Anorexia: Secondary | ICD-10-CM | POA: Diagnosis not present

## 2017-07-19 DIAGNOSIS — K5900 Constipation, unspecified: Secondary | ICD-10-CM | POA: Diagnosis not present

## 2017-07-19 DIAGNOSIS — C787 Secondary malignant neoplasm of liver and intrahepatic bile duct: Secondary | ICD-10-CM | POA: Diagnosis not present

## 2017-07-19 DIAGNOSIS — R5383 Other fatigue: Secondary | ICD-10-CM | POA: Diagnosis not present

## 2017-07-19 DIAGNOSIS — K769 Liver disease, unspecified: Secondary | ICD-10-CM | POA: Diagnosis not present

## 2017-07-19 DIAGNOSIS — Z8546 Personal history of malignant neoplasm of prostate: Secondary | ICD-10-CM | POA: Insufficient documentation

## 2017-07-19 DIAGNOSIS — R140 Abdominal distension (gaseous): Secondary | ICD-10-CM | POA: Insufficient documentation

## 2017-07-19 DIAGNOSIS — G893 Neoplasm related pain (acute) (chronic): Secondary | ICD-10-CM | POA: Diagnosis not present

## 2017-07-19 DIAGNOSIS — Z5112 Encounter for antineoplastic immunotherapy: Secondary | ICD-10-CM | POA: Diagnosis present

## 2017-07-19 LAB — PROTIME-INR
INR: 1.1
PROTHROMBIN TIME: 14.2 s (ref 11.4–15.2)

## 2017-07-19 LAB — LACTATE DEHYDROGENASE: LDH: 538 U/L — AB (ref 98–192)

## 2017-07-19 LAB — APTT: aPTT: 38 seconds — ABNORMAL HIGH (ref 24–36)

## 2017-07-19 LAB — PSA: Prostatic Specific Antigen: 0.04 ng/mL (ref 0.00–4.00)

## 2017-07-19 NOTE — Progress Notes (Signed)
[facility] CONSULT NOTE  Patient Care Team: Idelle Crouch, MD as PCP - General (Internal Medicine)  CHIEF COMPLAINTS/PURPOSE OF CONSULTATION:  Multiple liver lesions  #  Oncology History   # 2015- [facility] [place] ca stage I s/p SBRT [Dr.Crystal]  # 3rd June 2019- Multiple liver lesions  # Prostate cancer [ 11 years ago]; s/p surgery  # colon cancer [s/p resection of malignant polyps; Dr/Elliot; No colectomy]   # hx CAD [asprin/plavix]; hx PVD     [name]     HISTORY OF PRESENTING ILLNESS:  Phillip Sanchez 84 y.o.  male has been referred to [place] for further evaluation recommendation for multiple liver lesions.  Patient noted to have worsening abdominal pain mostly in the right upper quadrant without any radiation over the last 2 weeks.  Continuous; 4 and a scale of 10.  Denies any nausea vomiting denies any diarrhea.  Complains of intermittent constipation.  Complains of fatigue.  Feels bloated.  Denies any blood in stools or black or stools.  Complains of poor appetite.  Patient was evaluated in the emergency room-the CT of the abdomen pelvis that shows multiple liver lesions; innumerable.  No obvious primary source noted on the CT of the abdomen pelvis.  A previous CT scan chest from September 2018-did not show any obvious concerns for malignancy at the time.  Review of Systems  Constitutional: Positive for malaise/fatigue. Negative for chills, diaphoresis and fever.  HENT: Negative for nosebleeds and sore throat.   Eyes: Negative for double vision.  Respiratory: Positive for shortness of breath (Only on exertion.). Negative for cough, hemoptysis, sputum production and wheezing.   Cardiovascular: Negative for chest pain, palpitations, orthopnea and leg swelling.  Gastrointestinal: Positive for abdominal pain (Right upper quadrant) and constipation (Intermittent constipation). Negative for blood in stool, diarrhea, heartburn, melena,  nausea and vomiting.  Genitourinary: Negative for dysuria, frequency and urgency.  Musculoskeletal: Negative for back pain and joint pain.  Skin: Negative.  Negative for itching and rash.  Neurological: Negative for dizziness, tingling, focal weakness, weakness and headaches.  Endo/Heme/Allergies: Does not bruise/bleed easily.  Psychiatric/Behavioral: Negative for depression. The patient is not nervous/anxious and does not have insomnia.      MEDICAL HISTORY:  Past Medical History:  Diagnosis Date  . Anemia   . CHF (congestive heart failure) ([place])   . Colon cancer [facility])    He states they removed cancerous polyps.  . Hypercholesteremia   . Hypertension   . Lung cancer ([place])   . Myocardial infarction ([place])   . Osteoarthritis   . Prostate cancer [facility])    Prostatectomy.    SURGICAL HISTORY: Past Surgical History:  Procedure Laterality Date  . COLON SURGERY    . CORONARY ANGIOPLASTY WITH STENT PLACEMENT    . herniorrhaphy      SOCIAL HISTORY: hx of smoking- quit 89; no alcohol; in [place]; wife; 2 boys/girls; eletrconic techician [employer] Social History   Socioeconomic History  . Marital status: Married    Spouse name: Not on file  . Number of children: Not on file  . Years of education: Not on file  . Highest education level: Not on file  Occupational History  . Not on file  Social Needs  . Financial resource strain: Not on file  . Food insecurity:    Worry: Not on file    Inability: Not on file  . Transportation needs:    Medical: Not on  file    Non-medical: Not on file  Tobacco Use  . Smoking status: Former Smoker    Packs/day: 1.00    Years: 36.00    Pack years: 36.00    Types: Cigarettes    Last attempt to quit: 1987    Years since quitting: 32.4  . Smokeless tobacco: Never Used  Substance and Sexual Activity  . Alcohol use: No    Alcohol/week: 0.0 oz  . Drug use: No  . Sexual activity: Not Currently  Lifestyle  . Physical activity:    Days per week:  Not on file    Minutes per session: Not on file  . Stress: Not on file  Relationships  . Social connections:    Talks on phone: Not on file    Gets together: Not on file    Attends religious service: Not on file    Active member of club or organization: Not on file    Attends meetings of clubs or organizations: Not on file    Relationship status: Not on file  . Intimate partner violence:    Fear of current or ex partner: Not on file    Emotionally abused: Not on file    Physically abused: Not on file    Forced sexual activity: Not on file  Other Topics Concern  . Not on file  Social History Narrative  . Not on file    FAMILY HISTORY: Family History  Problem Relation Age of Onset  . Cancer Brother 55       unknown orgin  . Cancer Sister 82       unknown type    ALLERGIES:  has No Known Allergies.  MEDICATIONS:  Current Outpatient Medications  Medication Sig Dispense Refill  . amLODipine-benazepril (LOTREL) 10-40 MG per capsule TAKE ONE (1) CAPSULE EACH DAY    . aspirin EC 81 MG tablet Take 81 mg by mouth once.     Marland Kitchen atorvastatin (LIPITOR) 20 MG tablet Take 20 mg by mouth daily at 6 PM.     . calcium carbonate (OS-CAL - DOSED IN MG OF ELEMENTAL CALCIUM) 1250 (500 CA) MG tablet Take by mouth.    . carvedilol (COREG) 25 MG tablet Take 25 mg by mouth daily.     . clopidogrel (PLAVIX) 75 MG tablet Take 75 mg by mouth daily.     Marland Kitchen docusate sodium (COLACE) 100 MG capsule Take 1 capsule (100 mg total) by mouth daily as needed. 30 capsule 2  . fexofenadine-pseudoephedrine (ALLEGRA-D 24) 180-240 MG per 24 hr tablet Take 1 tablet by mouth daily.     . fluticasone (FLONASE) 50 MCG/ACT nasal spray Place 1 spray into the nose daily.     . hydrochlorothiazide (HYDRODIURIL) 25 MG tablet TAKE ONE (1) TABLET EACH DAY AS DIRECTED    . levocetirizine (XYZAL) 5 MG tablet Take by mouth.    . Multiple Vitamin (MULTI-VITAMINS) TABS Take by mouth.    . ranitidine (ZANTAC) 300 MG capsule Take by  mouth.    Marland Kitchen rOPINIRole (REQUIP) 1 MG tablet Take by mouth.    . temazepam (RESTORIL) 15 MG capsule     . oxyCODONE-acetaminophen (PERCOCET) 5-325 MG tablet Take 1 tablet by mouth every 4 (four) hours as needed for severe pain. (Patient not taking: Reported on 07/19/2017) 6 tablet 0   No current facility-administered medications for this visit.       Marland Kitchen  PHYSICAL EXAMINATION: ECOG PERFORMANCE STATUS: 1 - Symptomatic but completely ambulatory  Vitals:  07/19/17 1500  BP: (!) 168/80  Pulse: 90  Resp: 20  Temp: 97.6 F (36.4 C)   Filed Weights   07/19/17 1507  Weight: 190 lb (86.2 kg)    Physical Exam  Constitutional: He is oriented to person, place, and time and well-developed, well-nourished, and in no distress.  HENT:  Head: Normocephalic and atraumatic.  Mouth/Throat: Oropharynx is clear and moist. No oropharyngeal exudate.  Eyes: Pupils are equal, round, and reactive to light.  Neck: Normal range of motion. Neck supple.  Cardiovascular: Normal rate and regular rhythm.  Pulmonary/Chest: No respiratory distress. He has no wheezes.  Abdominal: Soft. Bowel sounds are normal. He exhibits no distension and no mass. There is hepatosplenomegaly (Liver enlarged-4 fingerbreadths below the costal margin). There is tenderness in the right upper quadrant. There is no rebound and no guarding.  Musculoskeletal: Normal range of motion. He exhibits no edema or tenderness.  Neurological: He is alert and oriented to person, place, and time.  Skin: Skin is warm.  Psychiatric: Affect normal.     LABORATORY DATA:  I have reviewed the data as listed Lab Results  Component Value Date   WBC 9.9 07/18/2017   HGB 12.9 (L) 07/18/2017   HCT 38.5 (L) 07/18/2017   MCV 101.1 (H) 07/18/2017   PLT 179 07/18/2017   Recent Labs    01/14/17 0906 07/18/17 1459  NA  --  139  K  --  3.6  CL  --  107  CO2  --  22  GLUCOSE  --  164*  BUN  --  20  CREATININE 1.20 1.15  CALCIUM  --  8.8*   GFRNONAA  --  55*  GFRAA  --  >60  PROT  --  6.6  ALBUMIN  --  3.5  AST  --  117*  ALT  --  102*  ALKPHOS  --  90  BILITOT  --  1.0    RADIOGRAPHIC STUDIES: I have personally reviewed the radiological images as listed and agreed with the findings in the report. Ct Abdomen Pelvis W Contrast  Result Date: 07/18/2017 CLINICAL DATA:  Prostate cancer. Diffuse abdominal pain for 5 weeks. Constipation. History of lung cancer. EXAM: CT ABDOMEN AND PELVIS WITH CONTRAST TECHNIQUE: Multidetector CT imaging of the abdomen and pelvis was performed using the standard protocol following bolus administration of intravenous contrast. CONTRAST:  177mL ISOVUE-300 IOPAMIDOL (ISOVUE-300) INJECTION 61% COMPARISON:  01/23/2014 01/14/2017 chest CT. PET. No prior dedicated abdominopelvic CT. FINDINGS: Lower chest: Emphysema. Bibasilar atelectasis. Normal heart size with right coronary artery atherosclerosis. Small bilateral pleural effusions are new. Hepatobiliary: Since 01/14/2017, development of widespread hepatic metastasis. Index right hepatic lobe lesion measures 5.4 cm on image 33/2. Index lateral segment left liver lobe lesion measures 4.9 cm on image 15/2. Normal gallbladder, without biliary ductal dilatation. Pancreas: Normal, without mass or ductal dilatation. Spleen: Normal in size, without focal abnormality. Adrenals/Urinary Tract: Normal adrenal glands. Too small to characterize lesions in both kidneys. Mild renal cortical thinning is within normal variation for age. No hydronephrosis. Stomach/Bowel: Normal stomach, without wall thickening. Extensive colonic diverticulosis. Normal terminal ileum and appendix. Normal small bowel. Vascular/Lymphatic: Advanced aortic and branch vessel atherosclerosis. Porta hepatis adenopathy is new and suspicious. Example gastrohepatic ligament node of 1.7 cm on image 26/2. No pelvic sidewall adenopathy. Reproductive: Prostatectomy, without locally recurrent disease. Other: No  significant free fluid. No evidence of omental or peritoneal disease. Musculoskeletal: Lumbosacral spondylosis. IMPRESSION: 1. Hepatic and porta hepatis nodal metastasis. This is  most likely from the patient's primary lung cancer or less likely prostate cancer. An unknown primary could look similar. 2. New small bilateral pleural effusions. 3. Coronary artery atherosclerosis. Aortic Atherosclerosis (ICD10-I70.0). Electronically Signed   By: Abigail Miyamoto M.D.   On: 07/18/2017 18:31    ASSESSMENT & PLAN:   Cancer of lower lobe of left lung ([place]) #Multiple liver lesions-symptomatic; mild elevation of LFTs.  Highly concerning for malignancy-especially aggressive given the rapid onset of symptoms.  The etiology of primary malignancy is unclear as metastasis is highly likely. [Patient has prior history of lung cancer; prostate cancer; ?  Colon cancer].   #I had a long discussion the patient regarding the treatment would basically depend upon primary malignancy.   #Recommend ultrasound-guided liver biopsy; next week [hold aspirin Plavix; can restart 2 days later post biopsy].  Check tumor markers-CEA; LDH; CA-19-9; PSA.  Patient will eventually need a MRI of the brain.  #The patient will follow-up in approximately 2 to 3 days post biopsy to review the results of the biopsy/next plan of care.  I offered to talk to patient's family regarding the above plan; he declines.  # # I reviewed the blood work- with the patient in detail; also reviewed the imaging independently [as summarized above]; and with the patient in detail.   Thank you Dr.Sparks for allowing me to participate in the care of your pleasant patient. Please do not hesitate to contact me with questions or concerns in the interim.  # 60 minutes face-to-face with the patient discussing the above plan of care; more than 50% of time spent on prognosis/ natural history; counseling and coordination.   All questions were answered. The patient knows to  call the clinic with any problems, questions or concerns.    Cammie Sickle, MD 07/19/2017 5:41 PM

## 2017-07-19 NOTE — Assessment & Plan Note (Addendum)
#  Multiple liver lesions-symptomatic; mild elevation of LFTs.  Highly concerning for malignancy-especially aggressive given the rapid onset of symptoms.  The etiology of primary malignancy is unclear as metastasis is highly likely. [Patient has prior history of lung cancer; prostate cancer; ?  Colon cancer].   #I had a long discussion the patient regarding the treatment would basically depend upon primary malignancy.   #Recommend ultrasound-guided liver biopsy; next week [hold aspirin Plavix; can restart 2 days later post biopsy].  Check tumor markers-CEA; LDH; CA-19-9; PSA.  Patient will eventually need a MRI of the brain.  #The patient will follow-up in approximately 2 to 3 days post biopsy to review the results of the biopsy/next plan of care.  I offered to talk to patient's family regarding the above plan; he declines.  # # I reviewed the blood work- with the patient in detail; also reviewed the imaging independently [as summarized above]; and with the patient in detail.   Thank you Dr.Sparks for allowing me to participate in the care of your pleasant patient. Please do not hesitate to contact me with questions or concerns in the interim.  # 60 minutes face-to-face with the patient discussing the above plan of care; more than 50% of time spent on prognosis/ natural history; counseling and coordination.

## 2017-07-19 NOTE — Patient Instructions (Signed)
#   HOLD Asprin/plavix until your liver biospy; and start it 2 days after the liver biopsy

## 2017-07-20 ENCOUNTER — Ambulatory Visit
Admission: RE | Admit: 2017-07-20 | Discharge: 2017-07-20 | Disposition: A | Discharge status: Home / Self Care | Payer: Medicare Other | Source: Ambulatory Visit | Attending: Internal Medicine | Admitting: Internal Medicine

## 2017-07-20 DIAGNOSIS — R590 Localized enlarged lymph nodes: Secondary | ICD-10-CM | POA: Insufficient documentation

## 2017-07-20 DIAGNOSIS — I700 Atherosclerosis of aorta: Secondary | ICD-10-CM | POA: Insufficient documentation

## 2017-07-20 DIAGNOSIS — C787 Secondary malignant neoplasm of liver and intrahepatic bile duct: Secondary | ICD-10-CM | POA: Insufficient documentation

## 2017-07-20 DIAGNOSIS — K7689 Other specified diseases of liver: Secondary | ICD-10-CM | POA: Diagnosis not present

## 2017-07-20 DIAGNOSIS — M899 Disorder of bone, unspecified: Secondary | ICD-10-CM | POA: Insufficient documentation

## 2017-07-20 DIAGNOSIS — J90 Pleural effusion, not elsewhere classified: Secondary | ICD-10-CM | POA: Insufficient documentation

## 2017-07-20 DIAGNOSIS — R918 Other nonspecific abnormal finding of lung field: Secondary | ICD-10-CM | POA: Insufficient documentation

## 2017-07-20 DIAGNOSIS — C3432 Malignant neoplasm of lower lobe, left bronchus or lung: Secondary | ICD-10-CM | POA: Diagnosis present

## 2017-07-20 LAB — AFP TUMOR MARKER: AFP, Serum, Tumor Marker: 2.9 ng/mL (ref 0.0–8.3)

## 2017-07-20 LAB — CEA: CEA: 203.6 ng/mL — ABNORMAL HIGH (ref 0.0–4.7)

## 2017-07-20 LAB — POCT I-STAT CREATININE: Creatinine, Ser: 1.1 mg/dL (ref 0.61–1.24)

## 2017-07-20 LAB — CANCER ANTIGEN 19-9: CAN 19-9: 39 U/mL — AB (ref 0–35)

## 2017-07-20 MED ORDER — IOPAMIDOL (ISOVUE-300) INJECTION 61%
60.0000 mL | Freq: Once | INTRAVENOUS | Status: AC | PRN
  Administered 2017-07-20: 60 mL via INTRAVENOUS

## 2017-07-22 ENCOUNTER — Telehealth: Payer: Self-pay | Admitting: *Deleted

## 2017-07-22 NOTE — Telephone Encounter (Signed)
Called to give pt's appt info. Pt unavailable at the time. Pt's grandson, Ysidro Evert, took appt info for biopsy and follow up after the biopsy. Informed of biopsy scheduled on 6/14 at 10am, [facility]. Instructions given to take last dose of plavix on 6/8 and may resume again the day after biopsy, pt to remain NPO after midnight the night before biopsy. Informed that will need a driver to take him home after the biopsy. Informed that has follow up scheduled on 6/20 at 1:45pm at the [facility] with Dr. Tasia Catchings since Dr. B is on vacation. Contact info given and instructed to call with further questions or needs. Ysidro Evert verbalized understanding.   Appts have been mailed as well. Nothing further needed at this time.

## 2017-07-26 ENCOUNTER — Other Ambulatory Visit: Payer: Self-pay

## 2017-07-26 ENCOUNTER — Emergency Department: Payer: Medicare Other

## 2017-07-26 ENCOUNTER — Inpatient Hospital Stay
Admission: EM | Admit: 2017-07-26 | Discharge: 2017-07-31 | DRG: 435 | Disposition: A | Discharge status: Home Health | Payer: Medicare Other | Attending: Internal Medicine | Admitting: Internal Medicine

## 2017-07-26 DIAGNOSIS — R1084 Generalized abdominal pain: Secondary | ICD-10-CM | POA: Diagnosis present

## 2017-07-26 DIAGNOSIS — R0902 Hypoxemia: Secondary | ICD-10-CM

## 2017-07-26 DIAGNOSIS — E785 Hyperlipidemia, unspecified: Secondary | ICD-10-CM | POA: Diagnosis present

## 2017-07-26 DIAGNOSIS — Z7189 Other specified counseling: Secondary | ICD-10-CM | POA: Diagnosis not present

## 2017-07-26 DIAGNOSIS — I5023 Acute on chronic systolic (congestive) heart failure: Secondary | ICD-10-CM

## 2017-07-26 DIAGNOSIS — Z9079 Acquired absence of other genital organ(s): Secondary | ICD-10-CM | POA: Diagnosis not present

## 2017-07-26 DIAGNOSIS — J189 Pneumonia, unspecified organism: Secondary | ICD-10-CM | POA: Diagnosis present

## 2017-07-26 DIAGNOSIS — J810 Acute pulmonary edema: Secondary | ICD-10-CM | POA: Diagnosis not present

## 2017-07-26 DIAGNOSIS — E7800 Pure hypercholesterolemia, unspecified: Secondary | ICD-10-CM | POA: Diagnosis present

## 2017-07-26 DIAGNOSIS — Z87891 Personal history of nicotine dependence: Secondary | ICD-10-CM

## 2017-07-26 DIAGNOSIS — Z79899 Other long term (current) drug therapy: Secondary | ICD-10-CM

## 2017-07-26 DIAGNOSIS — Z85038 Personal history of other malignant neoplasm of large intestine: Secondary | ICD-10-CM | POA: Diagnosis not present

## 2017-07-26 DIAGNOSIS — I5032 Chronic diastolic (congestive) heart failure: Secondary | ICD-10-CM | POA: Diagnosis present

## 2017-07-26 DIAGNOSIS — R109 Unspecified abdominal pain: Secondary | ICD-10-CM | POA: Diagnosis not present

## 2017-07-26 DIAGNOSIS — I2510 Atherosclerotic heart disease of native coronary artery without angina pectoris: Secondary | ICD-10-CM | POA: Diagnosis present

## 2017-07-26 DIAGNOSIS — Z85118 Personal history of other malignant neoplasm of bronchus and lung: Secondary | ICD-10-CM | POA: Diagnosis not present

## 2017-07-26 DIAGNOSIS — Z955 Presence of coronary angioplasty implant and graft: Secondary | ICD-10-CM

## 2017-07-26 DIAGNOSIS — Z515 Encounter for palliative care: Secondary | ICD-10-CM | POA: Diagnosis not present

## 2017-07-26 DIAGNOSIS — I110 Hypertensive heart disease with heart failure: Secondary | ICD-10-CM | POA: Diagnosis present

## 2017-07-26 DIAGNOSIS — R0600 Dyspnea, unspecified: Secondary | ICD-10-CM

## 2017-07-26 DIAGNOSIS — I252 Old myocardial infarction: Secondary | ICD-10-CM

## 2017-07-26 DIAGNOSIS — C787 Secondary malignant neoplasm of liver and intrahepatic bile duct: Secondary | ICD-10-CM | POA: Diagnosis present

## 2017-07-26 DIAGNOSIS — Z7902 Long term (current) use of antithrombotics/antiplatelets: Secondary | ICD-10-CM

## 2017-07-26 DIAGNOSIS — E8809 Other disorders of plasma-protein metabolism, not elsewhere classified: Secondary | ICD-10-CM | POA: Diagnosis present

## 2017-07-26 DIAGNOSIS — Z66 Do not resuscitate: Secondary | ICD-10-CM | POA: Diagnosis not present

## 2017-07-26 DIAGNOSIS — I248 Other forms of acute ischemic heart disease: Secondary | ICD-10-CM | POA: Diagnosis present

## 2017-07-26 DIAGNOSIS — R160 Hepatomegaly, not elsewhere classified: Secondary | ICD-10-CM

## 2017-07-26 DIAGNOSIS — Z809 Family history of malignant neoplasm, unspecified: Secondary | ICD-10-CM

## 2017-07-26 DIAGNOSIS — Z7951 Long term (current) use of inhaled steroids: Secondary | ICD-10-CM | POA: Diagnosis not present

## 2017-07-26 DIAGNOSIS — K769 Liver disease, unspecified: Secondary | ICD-10-CM | POA: Diagnosis not present

## 2017-07-26 DIAGNOSIS — Z8546 Personal history of malignant neoplasm of prostate: Secondary | ICD-10-CM

## 2017-07-26 DIAGNOSIS — R0602 Shortness of breath: Secondary | ICD-10-CM | POA: Diagnosis not present

## 2017-07-26 DIAGNOSIS — Z7982 Long term (current) use of aspirin: Secondary | ICD-10-CM

## 2017-07-26 DIAGNOSIS — J9601 Acute respiratory failure with hypoxia: Secondary | ICD-10-CM | POA: Diagnosis present

## 2017-07-26 DIAGNOSIS — J9690 Respiratory failure, unspecified, unspecified whether with hypoxia or hypercapnia: Secondary | ICD-10-CM

## 2017-07-26 DIAGNOSIS — R970 Elevated carcinoembryonic antigen [CEA]: Secondary | ICD-10-CM | POA: Diagnosis not present

## 2017-07-26 LAB — CBC
HEMATOCRIT: 39.7 % — AB (ref 40.0–52.0)
Hemoglobin: 13.6 g/dL (ref 13.0–18.0)
MCH: 34.4 pg — ABNORMAL HIGH (ref 26.0–34.0)
MCHC: 34.2 g/dL (ref 32.0–36.0)
MCV: 100.5 fL — ABNORMAL HIGH (ref 80.0–100.0)
Platelets: 182 10*3/uL (ref 150–440)
RBC: 3.95 MIL/uL — AB (ref 4.40–5.90)
RDW: 13.6 % (ref 11.5–14.5)
WBC: 16.3 10*3/uL — AB (ref 3.8–10.6)

## 2017-07-26 LAB — URINALYSIS, COMPLETE (UACMP) WITH MICROSCOPIC
BILIRUBIN URINE: NEGATIVE
Glucose, UA: NEGATIVE mg/dL
KETONES UR: 5 mg/dL — AB
LEUKOCYTES UA: NEGATIVE
Nitrite: NEGATIVE
PH: 5 (ref 5.0–8.0)
Protein, ur: 30 mg/dL — AB
Specific Gravity, Urine: >1.046 — ABNORMAL HIGH (ref 1.005–1.030)

## 2017-07-26 LAB — TROPONIN I: TROPONIN I: 0.04 ng/mL — AB (ref <0.03)

## 2017-07-26 LAB — GLUCOSE, CAPILLARY: Glucose-Capillary: 197 mg/dL — ABNORMAL HIGH (ref 65–99)

## 2017-07-26 LAB — COMPREHENSIVE METABOLIC PANEL
ALT: 103 U/L — ABNORMAL HIGH (ref 17–63)
ANION GAP: 10 (ref 5–15)
AST: 155 U/L — AB (ref 15–41)
Albumin: 3.2 g/dL — ABNORMAL LOW (ref 3.5–5.0)
Alkaline Phosphatase: 127 U/L — ABNORMAL HIGH (ref 38–126)
BUN: 29 mg/dL — ABNORMAL HIGH (ref 6–20)
CHLORIDE: 104 mmol/L (ref 101–111)
CO2: 21 mmol/L — ABNORMAL LOW (ref 22–32)
Calcium: 8.1 mg/dL — ABNORMAL LOW (ref 8.9–10.3)
Creatinine, Ser: 1.12 mg/dL (ref 0.61–1.24)
GFR, EST NON AFRICAN AMERICAN: 57 mL/min — AB (ref >60)
Glucose, Bld: 164 mg/dL — ABNORMAL HIGH (ref 65–99)
POTASSIUM: 4.1 mmol/L (ref 3.5–5.1)
Sodium: 135 mmol/L (ref 135–145)
Total Bilirubin: 1.9 mg/dL — ABNORMAL HIGH (ref 0.3–1.2)
Total Protein: 6.7 g/dL (ref 6.5–8.1)

## 2017-07-26 LAB — TSH: TSH: 0.545 u[IU]/mL (ref 0.350–4.500)

## 2017-07-26 LAB — PHOSPHORUS: Phosphorus: 4.8 mg/dL — ABNORMAL HIGH (ref 2.5–4.6)

## 2017-07-26 LAB — PROCALCITONIN: Procalcitonin: 0.87 ng/mL

## 2017-07-26 LAB — MRSA PCR SCREENING: MRSA by PCR: NEGATIVE

## 2017-07-26 LAB — LIPASE, BLOOD: LIPASE: 39 U/L (ref 11–51)

## 2017-07-26 LAB — POTASSIUM: POTASSIUM: 4 mmol/L (ref 3.5–5.1)

## 2017-07-26 LAB — MAGNESIUM: MAGNESIUM: 2.3 mg/dL (ref 1.7–2.4)

## 2017-07-26 MED ORDER — ACETAMINOPHEN 650 MG RE SUPP: 650.0000 mg | Freq: Four times a day (QID) | RECTAL | Status: DC | PRN

## 2017-07-26 MED ORDER — ONDANSETRON HCL 4 MG/2ML IJ SOLN: 4.0000 mg | Freq: Four times a day (QID) | INTRAMUSCULAR | Status: DC | PRN

## 2017-07-26 MED ORDER — NALOXONE HCL 2 MG/2ML IJ SOSY
0.5000 mg | PREFILLED_SYRINGE | Freq: Once | INTRAMUSCULAR | Status: AC
  Administered 2017-07-26: 0.5 mg via INTRAVENOUS

## 2017-07-26 MED ORDER — MORPHINE SULFATE (PF) 2 MG/ML IV SOLN
1.0000 mg | INTRAVENOUS | Status: DC | PRN
Start: 2017-07-26 — End: 2017-07-28
  Administered 2017-07-27 – 2017-07-28 (×4): 1 mg via INTRAVENOUS
  Filled 2017-07-26 (×4): qty 1

## 2017-07-26 MED ORDER — MORPHINE SULFATE (PF) 4 MG/ML IV SOLN
INTRAVENOUS | Status: AC
  Administered 2017-07-26: 4 mg via INTRAVENOUS
  Filled 2017-07-26: qty 1

## 2017-07-26 MED ORDER — HYDRALAZINE HCL 20 MG/ML IJ SOLN: 10.0000 mg | Freq: Four times a day (QID) | INTRAMUSCULAR | Status: DC | PRN

## 2017-07-26 MED ORDER — CARVEDILOL 25 MG PO TABS: 25.0000 mg | ORAL_TABLET | Freq: Two times a day (BID) | ORAL | Status: DC

## 2017-07-26 MED ORDER — ATORVASTATIN CALCIUM 20 MG PO TABS: 20.0000 mg | ORAL_TABLET | Freq: Every day | ORAL | Status: DC

## 2017-07-26 MED ORDER — FAMOTIDINE 20 MG PO TABS: 20.0000 mg | ORAL_TABLET | Freq: Two times a day (BID) | ORAL | Status: DC

## 2017-07-26 MED ORDER — HEPARIN SODIUM (PORCINE) 5000 UNIT/ML IJ SOLN
5000.0000 [IU] | Freq: Three times a day (TID) | INTRAMUSCULAR | Status: AC
  Administered 2017-07-26 – 2017-07-28 (×6): 5000 [IU] via SUBCUTANEOUS
  Filled 2017-07-26 (×7): qty 1

## 2017-07-26 MED ORDER — SODIUM CHLORIDE 0.9 % IV SOLN: 500.0000 mg | Freq: Once | INTRAVENOUS | Status: DC

## 2017-07-26 MED ORDER — MULTI-VITAMINS PO TABS: 1.0000 | ORAL_TABLET | Freq: Every day | ORAL | Status: DC

## 2017-07-26 MED ORDER — MORPHINE SULFATE (PF) 4 MG/ML IV SOLN
4.0000 mg | Freq: Once | INTRAVENOUS | Status: AC
  Administered 2017-07-26: 4 mg via INTRAVENOUS

## 2017-07-26 MED ORDER — IOHEXOL 300 MG/ML  SOLN
100.0000 mL | Freq: Once | INTRAMUSCULAR | Status: AC | PRN
  Administered 2017-07-26: 100 mL via INTRAVENOUS

## 2017-07-26 MED ORDER — LORATADINE 10 MG PO TABS: 10.0000 mg | ORAL_TABLET | Freq: Every day | ORAL | Status: DC

## 2017-07-26 MED ORDER — FLUTICASONE PROPIONATE 50 MCG/ACT NA SUSP
1.0000 | Freq: Every day | NASAL | Status: DC | PRN
  Filled 2017-07-26: qty 16

## 2017-07-26 MED ORDER — FUROSEMIDE 10 MG/ML IJ SOLN
40.0000 mg | Freq: Once | INTRAMUSCULAR | Status: AC
  Administered 2017-07-26: 40 mg via INTRAVENOUS
  Filled 2017-07-26: qty 4

## 2017-07-26 MED ORDER — ONDANSETRON HCL 4 MG PO TABS: 4.0000 mg | ORAL_TABLET | Freq: Four times a day (QID) | ORAL | Status: DC | PRN

## 2017-07-26 MED ORDER — SODIUM CHLORIDE 0.9 % IV SOLN: 1.0000 g | INTRAVENOUS | Status: DC

## 2017-07-26 MED ORDER — NALOXONE HCL 2 MG/2ML IJ SOSY
PREFILLED_SYRINGE | INTRAMUSCULAR | Status: AC
  Filled 2017-07-26: qty 2

## 2017-07-26 MED ORDER — LEVOCETIRIZINE DIHYDROCHLORIDE 5 MG PO TABS: 5.0000 mg | ORAL_TABLET | Freq: Every day | ORAL | Status: DC | PRN

## 2017-07-26 MED ORDER — AMLODIPINE BESY-BENAZEPRIL HCL 10-40 MG PO CAPS: 1.0000 | ORAL_CAPSULE | Freq: Every day | ORAL | Status: DC

## 2017-07-26 MED ORDER — TEMAZEPAM 7.5 MG PO CAPS: 15.0000 mg | ORAL_CAPSULE | Freq: Every evening | ORAL | Status: DC | PRN

## 2017-07-26 MED ORDER — SODIUM CHLORIDE 0.9 % IV BOLUS
1000.0000 mL | Freq: Once | INTRAVENOUS | Status: AC
  Administered 2017-07-26: 1000 mL via INTRAVENOUS

## 2017-07-26 MED ORDER — IOPAMIDOL (ISOVUE-300) INJECTION 61%: 30.0000 mL | Freq: Once | INTRAVENOUS | Status: DC | PRN

## 2017-07-26 MED ORDER — ROPINIROLE HCL 1 MG PO TABS
1.0000 mg | ORAL_TABLET | Freq: Every day | ORAL | Status: DC
  Filled 2017-07-26: qty 1

## 2017-07-26 MED ORDER — DOCUSATE SODIUM 100 MG PO CAPS: 100.0000 mg | ORAL_CAPSULE | Freq: Two times a day (BID) | ORAL | Status: DC

## 2017-07-26 MED ORDER — ONDANSETRON HCL 4 MG/2ML IJ SOLN
4.0000 mg | Freq: Once | INTRAMUSCULAR | Status: AC
  Administered 2017-07-26: 4 mg via INTRAVENOUS
  Filled 2017-07-26: qty 2

## 2017-07-26 MED ORDER — ACETAMINOPHEN 325 MG PO TABS: 650.0000 mg | ORAL_TABLET | Freq: Four times a day (QID) | ORAL | Status: DC | PRN

## 2017-07-26 MED ORDER — MORPHINE SULFATE (PF) 4 MG/ML IV SOLN
4.0000 mg | Freq: Once | INTRAVENOUS | Status: AC
  Administered 2017-07-26 (×2): 4 mg via INTRAVENOUS
  Filled 2017-07-26: qty 1

## 2017-07-26 MED ORDER — FUROSEMIDE 10 MG/ML IJ SOLN
60.0000 mg | Freq: Once | INTRAMUSCULAR | Status: AC
  Administered 2017-07-26: 60 mg via INTRAVENOUS
  Filled 2017-07-26: qty 8

## 2017-07-26 MED ORDER — FAMOTIDINE IN NACL 20-0.9 MG/50ML-% IV SOLN
20.0000 mg | Freq: Two times a day (BID) | INTRAVENOUS | Status: DC
  Administered 2017-07-26: 20 mg via INTRAVENOUS
  Filled 2017-07-26: qty 50

## 2017-07-26 MED ORDER — LORAZEPAM 2 MG/ML IJ SOLN
0.5000 mg | Freq: Once | INTRAMUSCULAR | Status: AC
  Administered 2017-07-26: 0.5 mg via INTRAVENOUS
  Filled 2017-07-26: qty 1

## 2017-07-26 NOTE — ED Notes (Addendum)
Pt not responding to family and RN at this time. Pt O2 at 82% on 2 L. Pt's O2 bumped up and no improvement. Non rebreather applied at this time. Pt very diaphoretic. MD Called.

## 2017-07-26 NOTE — Consult Note (Signed)
   CHIEF COMPLAINT:   Chief Complaint  Patient presents with  . Abdominal Pain  . Shortness of Breath    Subjective  84 y.o. male with a past medical history of CHF, hypertension, hyperlipidemia, recently diagnosed lwith metastatic disease-unknown primary   presents to the emergency department for abdominal pain, distention and shortness of breath as well as lower extremity edema.  having worsening abdominal discomfort and swelling +bloated and nauseated +shortness of breath, tachypneic in the 30-40's Patient agitated with with increased WOB was placed on biPAP and given ativan       Objective   PHYSICAL EXAMINATION:  GENERAL:critically ill appearing, +resp distress HEAD: Normocephalic, atraumatic.  EYES: Pupils equal, round, reactive to light.  No scleral icterus.  MOUTH: Moist mucosal membrane. NECK: Supple. No thyromegaly. No nodules. No JVD.  PULMONARY: +rhonchi, +rales CARDIOVASCULAR: S1 and S2. Regular rate and rhythm. No murmurs, rubs, or gallops.  GASTROINTESTINAL: +distended. MUSCULOSKELETAL: + edema.  NEUROLOGIC: lethargic SKIN:intact,warm,dry    VITALS:  height is 5\' 10"  (1.778 m) and weight is 197 lb 15.6 oz (89.8 kg). His axillary temperature is 98.4 F (36.9 C). His blood pressure is 136/46 (abnormal) and his pulse is 96. His respiration is 29 (abnormal) and oxygen saturation is 95%.   I personally reviewed Labs under Results section.  Radiology Reports CXR b/l interstitial infiltrates    Assessment/Plan:  84 yo white male with severe resp failure from acute CHF exacerbation complicated by agitation with underlying metastatic cancer unknown primary  1.biPAP as needed 2.fio2 as needed 3.lasix as tolerated 4.stop ABX 5.morphine as needed  Code Status: full   Critical Care Time devoted to patient care services described in this note is 43 minutes.   Overall, patient is critically ill, prognosis is guarded.  Patient with Multiorgan failure and  at high risk for cardiac arrest and death.   Recommend DNR/DNI  status and palliative care consultation Family not available at this time   Corrin Parker, M.D.  [facility]  Medical Director [place] Director [facility] Cardio-Pulmonary Department

## 2017-07-26 NOTE — ED Notes (Signed)
MD at bedside updating pt and family on plan of care

## 2017-07-26 NOTE — ED Notes (Addendum)
Patient transported to XRAY 

## 2017-07-26 NOTE — ED Notes (Signed)
4mg  of morphine given only once. Medication not linking together at this time.

## 2017-07-26 NOTE — H&P (Addendum)
Phillip Sanchez is an 84 y.o. male.   Chief Complaint: Shortness of breath HPI: The patient with past medical history of colon cancer, prostate cancer status post prostatectomy and left lower lung adenocarcinoma in remission presents to the emergency department with shortness of breath.  He also admits to abdominal pain and lower extremity edema.  The patient also has had some urinary urgency but denies dysuria, fever, nausea or vomiting.  Chest x-ray in the emergency department revealed pulmonary edema as well as left lung opacity concerning for pneumonia.  The patient also had small effusions.  He was given Lasix 60 mg IV and has had good urine output.  CT of his abdomen revealed multiple lesions in the liver concerning for prostatic disease which prompted the emergency department staff to call the hospitalist service for admission.  Past Medical History:  Diagnosis Date  . Anemia   . CHF (congestive heart failure) ([place])   . Colon cancer [facility])    He states they removed cancerous polyps.  . Hypercholesteremia   . Hypertension   . Lung cancer ([place])   . Myocardial infarction ([place])   . Osteoarthritis   . Prostate cancer [facility])    Prostatectomy.    Past Surgical History:  Procedure Laterality Date  . COLON SURGERY    . CORONARY ANGIOPLASTY WITH STENT PLACEMENT    . herniorrhaphy      Family History  Problem Relation Age of Onset  . Cancer Brother 60       unknown orgin  . Cancer Sister 75       unknown type   Social History:  reports that he quit smoking about 32 years ago. His smoking use included cigarettes. He has a 36.00 pack-year smoking history. He has never used smokeless tobacco. He reports that he does not drink alcohol or use drugs.  Allergies: No Known Allergies  Medications Prior to Admission  Medication Sig Dispense Refill  . amLODipine-benazepril (LOTREL) 10-40 MG capsule Take 1 capsule by mouth daily.    Marland Kitchen atorvastatin (LIPITOR) 20 MG tablet Take 20 mg by mouth at  bedtime.     . carvedilol (COREG) 25 MG tablet Take 25 mg by mouth 2 (two) times daily.     Marland Kitchen docusate sodium (COLACE) 100 MG capsule Take 1 capsule (100 mg total) by mouth daily as needed. (Patient taking differently: Take 100 mg by mouth daily as needed for mild constipation. ) 30 capsule 2  . fexofenadine-pseudoephedrine (ALLEGRA-D 24) 180-240 MG per 24 hr tablet Take 1 tablet by mouth daily as needed (for allergies).     . fluticasone (FLONASE) 50 MCG/ACT nasal spray Place 1 spray into the nose daily as needed for allergies or rhinitis.     Marland Kitchen levocetirizine (XYZAL) 5 MG tablet Take 5 mg by mouth daily as needed for allergies.     . Multiple Vitamin (MULTI-VITAMINS) TABS Take 1 tablet by mouth daily.     . ranitidine (ZANTAC) 300 MG tablet Take 300 mg by mouth at bedtime.    Marland Kitchen rOPINIRole (REQUIP) 1 MG tablet Take 1 mg by mouth at bedtime.     . temazepam (RESTORIL) 15 MG capsule Take 15-30 mg by mouth at bedtime as needed for sleep.     Marland Kitchen aspirin EC 81 MG tablet Take 81 mg by mouth daily.     . clopidogrel (PLAVIX) 75 MG tablet Take 75 mg by mouth daily.     Marland Kitchen oxyCODONE-acetaminophen (PERCOCET) 5-325 MG tablet Take 1 tablet  by mouth every 4 (four) hours as needed for severe pain. (Patient not taking: Reported on 07/19/2017) 6 tablet 0    Results for orders placed or performed during the hospital encounter of 07/26/17 (from the past 48 hour(s))  Lipase, blood     Status: None   Collection Time: 07/26/17 10:53 AM  Result Value Ref Range   Lipase 39 11 - 51 U/L    Comment: Performed at [facility], [address] [place], [place] [address]  Comprehensive metabolic panel     Status: Abnormal   Collection Time: 07/26/17 10:53 AM  Result Value Ref Range   Sodium 135 135 - 145 mmol/L   Potassium 4.1 3.5 - 5.1 mmol/L   Chloride 104 101 - 111 mmol/L   CO2 21 (L) 22 - 32 mmol/L   Glucose, Bld 164 (H) 65 - 99 mg/dL   BUN 29 (H) 6 - 20 mg/dL   Creatinine, Ser 1.12 0.61 - 1.24 mg/dL    Calcium 8.1 (L) 8.9 - 10.3 mg/dL   Total Protein 6.7 6.5 - 8.1 g/dL   Albumin 3.2 (L) 3.5 - 5.0 g/dL   AST 155 (H) 15 - 41 U/L   ALT 103 (H) 17 - 63 U/L   Alkaline Phosphatase 127 (H) 38 - 126 U/L   Total Bilirubin 1.9 (H) 0.3 - 1.2 mg/dL   GFR calc non Af Amer 57 (L) >60 mL/min   GFR calc Af Amer >60 >60 mL/min    Comment: (NOTE) The eGFR has been calculated using the CKD EPI equation. This calculation has not been validated in all clinical situations. eGFR's persistently <60 mL/min signify possible Chronic Kidney Disease.    Anion gap 10 5 - 15    Comment: Performed at [facility], [address] [place], [place] [address]  CBC     Status: Abnormal   Collection Time: 07/26/17 10:53 AM  Result Value Ref Range   WBC 16.3 (H) 3.8 - 10.6 K/uL   RBC 3.95 (L) 4.40 - 5.90 MIL/uL   Hemoglobin 13.6 13.0 - 18.0 g/dL   HCT 39.7 (L) 40.0 - 52.0 %   MCV 100.5 (H) 80.0 - 100.0 fL   MCH 34.4 (H) 26.0 - 34.0 pg   MCHC 34.2 32.0 - 36.0 g/dL   RDW 13.6 11.5 - 14.5 %   Platelets 182 150 - 440 K/uL    Comment: Performed at [facility], [address] [place], [place] [address]  Troponin I     Status: Abnormal   Collection Time: 07/26/17 10:53 AM  Result Value Ref Range   Troponin I 0.04 (HH) <0.03 ng/mL    Comment: CRITICAL RESULT CALLED TO, READ BACK BY AND VERIFIED WITH  [facility] [place] AT 1321 07/26/17 SDR Performed at [facility], [address] [place], [place] [address]    Dg Chest 2 View  Result Date: 07/26/2017 CLINICAL DATA:  Shortness of breath EXAM: CHEST - 2 VIEW COMPARISON:  Chest CT 07/20/2017 FINDINGS: Small bilateral pleural effusions. Heart is borderline in size. Mild vascular congestion and interstitial prominence within the lungs which could reflect interstitial edema. More confluent left perihilar opacity as seen on prior chest CT. IMPRESSION: Mild vascular congestion and interstitial prominence, possibly interstitial edema. More  confluent left perihilar opacity concerning for pneumonia. Small bilateral effusions. Electronically Signed   By: Rolm Baptise M.D.   On: 07/26/2017 11:25   Ct Abdomen Pelvis W Contrast  Result Date: 07/26/2017 CLINICAL DATA:  Abdominal pain and distension. Shortness of breath. Reported history of lung, colon, and prostate carcinoma EXAM: CT ABDOMEN AND PELVIS WITH CONTRAST TECHNIQUE: Multidetector CT imaging of the abdomen and pelvis was performed using the standard protocol following bolus administration of intravenous contrast. CONTRAST:  181m OMNIPAQUE IOHEXOL 300 MG/ML  SOLN COMPARISON:  July 18, 2017 FINDINGS: Lower chest: There are moderate pleural effusions bilaterally with bibasilar atelectatic change. There is a fairly small pericardial effusion. There are foci of coronary artery calcification. Hepatobiliary: There is widespread hepatic metastatic disease with lesions seen throughout all lobes in segments of the liver. Lesions range in size from as small as 8 mm to as large as 5.9 x 5.2 cm cm. This largest lesion is seen in the posterior segment right lobe. There is a 5.6 x 5.4 cm lesion in the left lobe of the liver near the junction with the right lobe. There is no appreciable biliary duct dilatation. Gallbladder is contracted. Pancreas: No pancreatic mass or inflammatory focus evident. Spleen: No splenic lesions are evident. Adrenals/Urinary Tract: Adrenals bilaterally appear unremarkable. There are is a cyst in the mid right kidney measuring 0.9 x 0.6 cm. There is a cyst in the posterior mid left kidney measuring 1.0 x 0.7 cm. There are scattered tiny probable cysts elsewhere in the kidneys. No hydronephrosis evident on either side. There is no renal or ureteral calculus on either side. Urinary bladder is midline with wall thickness within normal limits. Stomach/Bowel: There are multiple sigmoid and descending colonic diverticula without diverticulitis. There is no appreciable bowel wall or  mesenteric thickening. No evident bowel obstruction. No free air or portal venous air. Vascular/Lymphatic: There is atherosclerotic calcification throughout the aorta and iliac arteries. There is calcification in major mesenteric arterial vessels proximally without frank obstruction. There is no appreciable abdominal aortic aneurysm. There is adenopathy in the porta hepatis region, slightly larger compared to recent study. Largest lymph node in the porta hepatis region measures 2.9 x 2.0 cm. There is no other adenopathy seen elsewhere. Reproductive: Prostate is absent. No pelvic mass evident. There is scrotal wall thickening with questionable small hydrocele on the right. Other: Appendix appears normal. No abscess or ascites is evident in the abdomen or pelvis. No omental lesions evident. Musculoskeletal: There are multiple foci of degenerative change in the lower thoracic and lumbar regions. Sclerosis in the L2 vertebral body is stable, likely due to arthropathy. Bony metastatic disease not felt to be likely in this area. There is no lytic or destructive bone lesion. No sclerotic appearing metastasis. No intramuscular lesions evident. IMPRESSION: 1. Widespread hepatic metastatic disease with multiple lesions throughout all lobes in segments. There is also porta hepatis region adenopathy with the largest lymph node in this area slightly larger compared to recent prior study. Neoplastic involvement in this area is felt to be present. No omental lesions evident. 2. Widespread left-sided colonic diverticulosis without diverticulitis. No obvious mass related to bowel evident. No bowel obstruction or inflammatory change. Appendix appears normal. 3.  Prostate absent. 4. Sizable pleural effusions bilaterally with bibasilar atelectasis. Effusions appear larger than on recent study. There is a fairly small pericardial effusion as well. 5. Extensive aortoiliac atherosclerosis. Calcification noted in major mesenteric arterial  vessels without frank obstruction. There are foci of coronary artery calcification. 6. Scrotal wall edema in the dependent portion of the scrotum. Question small right scrotal hydrocele. Aortic Atherosclerosis (ICD10-I70.0). Electronically Signed   By: WLowella GripIII M.D.   On: 07/26/2017 11:52    Review  of Systems  Constitutional: Negative for chills and fever.  HENT: Negative for sore throat and tinnitus.   Eyes: Negative for blurred vision and redness.  Respiratory: Positive for shortness of breath. Negative for cough.   Cardiovascular: Negative for chest pain, palpitations, orthopnea and PND.  Gastrointestinal: Positive for abdominal pain. Negative for diarrhea, nausea and vomiting.  Genitourinary: Positive for urgency. Negative for dysuria and frequency.  Musculoskeletal: Negative for joint pain and myalgias.  Skin: Negative for rash.       No lesions  Neurological: Negative for speech change, focal weakness and weakness.  Endo/Heme/Allergies: Does not bruise/bleed easily.       No temperature intolerance  Psychiatric/Behavioral: Negative for depression and suicidal ideas.    Blood pressure (!) 162/102, pulse (!) 110, temperature 99.3 F (37.4 C), temperature source Oral, resp. rate (!) 33, height '5\' 10"'  (1.778 m), weight 86.2 kg (190 lb), SpO2 95 %. Physical Exam  Vitals reviewed. Constitutional: He is oriented to person, place, and time. He appears well-developed and well-nourished. He appears distressed.  HENT:  Head: Normocephalic and atraumatic.  Mouth/Throat: Oropharynx is clear and moist.  Eyes: Pupils are equal, round, and reactive to light. Conjunctivae and EOM are normal. No scleral icterus.  Neck: Normal range of motion. Neck supple. No JVD present. No tracheal deviation present. No thyromegaly present.  Cardiovascular: Normal rate, regular rhythm and normal heart sounds. Exam reveals no gallop and no friction rub.  No murmur heard. Respiratory: He is in  respiratory distress. He has wheezes. He has no rales.  Now on BiPAP  GI: Soft. Bowel sounds are normal. He exhibits no distension. There is no tenderness.  Genitourinary:  Genitourinary Comments: Deferred  Musculoskeletal: Normal range of motion. He exhibits no edema.  Lymphadenopathy:    He has no cervical adenopathy.  Neurological: He is alert and oriented to person, place, and time. No cranial nerve deficit.  Skin: Skin is warm and dry. No rash noted. No erythema.  Psychiatric: He has a normal mood and affect. His speech is normal. Judgment and thought content normal. He is agitated.     Assessment/Plan This is an 84 year old male admitted for pneumonia. 1.  Pneumonia: Community-acquired; continue submental oxygen.  Patient is currently on BiPAP for increased work of breathing.  Bilateral effusions are small and likely do not need drainage at this time.  He also has some wheezing and will need breathing treatments as needed.  Continue azithromycin and Rocephin.  Lasix as needed for pulmonary edema. 2.  Sepsis: The patient meets criteria via leukocytosis and tachypnea.  He is hemodynamically stable.  Follow blood cultures growth and sensitivity.  Obtain sputum sample if possible. 3.  Metastatic disease: Multiple liver metastases.  Consult oncology.  History of prostate and colon cancer status post prostatectomy. 4.  CAD: Elevated troponin likely secondary to sepsis.  Follow cardiac biomarkers.  Monitor telemetry.  Aspirin and Plavix have been held due to anticipated need for biopsy and/or thoracentesis in the future. 5.  Hypertension: Initially elevated but now controlled; continue amlodipine and benazepril.  Also continue carvedilol. 6.  DVT prophylaxis: Heparin 7.  GI prophylaxis: H2 blocker per home regimen The patient is a full code.  Time spent on admission orders and critical care approximately 45 minutes.  Discussed with ICU attending.  Harrie Foreman, MD 07/26/2017, 2:55  PM

## 2017-07-26 NOTE — ED Notes (Signed)
Patient transported to CT 

## 2017-07-26 NOTE — ED Triage Notes (Signed)
Pt arrived via POV with family with reports of shortness of breath for the past week, pt scheduled for liver biopsy on Friday.   Family states pt has had poor appetitie and decreased mobility. Pt also has pitting edema to lower extremities.   Pt has had intermittent abdominal pain and has been distended.  Pt states he is belching a lot more.   Per son, CT scan showed 2 spots on liver which is why he is having liver biopsy. Pt has hx of cancer-prostate, colon and lung.  All in remission at this time.

## 2017-07-26 NOTE — ED Notes (Signed)
Warm wash cloth applied to pt's forehead at this time. Pt's breathing slowed down a little. Pt denies any pain.

## 2017-07-26 NOTE — ED Notes (Signed)
Respiratory at bedside with BIPAP and MD Paduchowski at bedside

## 2017-07-26 NOTE — ED Notes (Signed)
Date and time results received: 07/26/17 1322 (use smartphrase ".now" to insert current time)  Test: troponin Critical Value: 0.04  Name of Provider Notified: Paduchowski  Orders Received? Or Actions Taken?: Orders Received - See Orders for details

## 2017-07-26 NOTE — ED Notes (Signed)
Attempted to call report, per Erline Levine they are getting beady ready at this time and will call right back shortly.

## 2017-07-26 NOTE — ED Notes (Signed)
Pt cleaned up and repositioned in bed at this time 

## 2017-07-26 NOTE — ED Provider Notes (Addendum)
[facility] Emergency Department Provider Note  Time seen: 11:14 AM  I have reviewed the triage vital signs and the nursing notes.   HISTORY  Chief Complaint Abdominal Pain and Shortness of Breath    HPI Phillip Sanchez is a 84 y.o. male with a past medical history of CHF, hypertension, hyperlipidemia, recently diagnosed lung versus prostate cancer with metastatic disease, presents to the emergency department for abdominal pain, distention and shortness of breath as well as lower extremity edema.  Patient was recently seen for abdominal pain, ended up having a CT scan of the chest showing possible lung nodule/cancer, CT scan abdomen approximately a week ago showing likely metastatic disease possible prostate cancer.  Patient states today he is coming in because he is having worsening abdominal discomfort and swelling.  States he feels extremely bloated and nauseated.  Is having shortness of breath, tachypneic in the 30s during my evaluation.  States occasional cough denies sputum production.  Denies fever.  Patient states he feels constipated but is not sure when his last bowel movement was.  Denies any vomiting.  Denies dysuria or hematuria.  Patient also has new onset lower extremity edema which started today per patient.  Describes his abdominal pain as an 8/10 dull aching diffuse pain.  Mild shortness of breath.   Past Medical History:  Diagnosis Date  . Anemia   . CHF (congestive heart failure) ([place])   . Colon cancer [facility])    He states they removed cancerous polyps.  . Hypercholesteremia   . Hypertension   . Lung cancer ([place])   . Myocardial infarction ([place])   . Osteoarthritis   . Prostate cancer [facility])    Prostatectomy.    Patient Active Problem List   Diagnosis Date Noted  . Liver metastases ([place]) 07/19/2017  . Cancer of lower lobe of left lung ([place]) 01/16/2016    Past Surgical History:  Procedure Laterality Date  . COLON SURGERY    . CORONARY  ANGIOPLASTY WITH STENT PLACEMENT    . herniorrhaphy      Prior to Admission medications   Medication Sig Start Date End Date Taking? Authorizing Provider  amLODipine-benazepril (LOTREL) 10-40 MG per capsule TAKE ONE (1) CAPSULE EACH DAY 04/12/14   [provider]  aspirin EC 81 MG tablet Take 81 mg by mouth once.     [provider]  atorvastatin (LIPITOR) 20 MG tablet Take 20 mg by mouth daily at 6 PM.  05/02/14   [provider]  calcium carbonate (OS-CAL - DOSED IN MG OF ELEMENTAL CALCIUM) 1250 (500 CA) MG tablet Take by mouth.    [provider]  carvedilol (COREG) 25 MG tablet Take 25 mg by mouth daily.  04/12/14   [provider]  clopidogrel (PLAVIX) 75 MG tablet Take 75 mg by mouth daily.     [provider]  docusate sodium (COLACE) 100 MG capsule Take 1 capsule (100 mg total) by mouth daily as needed. 07/18/17 07/18/18  Schuyler Amor, MD  fexofenadine-pseudoephedrine (ALLEGRA-D 24) 180-240 MG per 24 hr tablet Take 1 tablet by mouth daily.     [provider]  fluticasone (FLONASE) 50 MCG/ACT nasal spray Place 1 spray into the nose daily.     [provider]  hydrochlorothiazide (HYDRODIURIL) 25 MG tablet TAKE ONE (1) TABLET EACH DAY AS DIRECTED 04/12/14   [provider]  levocetirizine (XYZAL) 5 MG tablet Take by mouth. 05/02/14   [provider]  Multiple Vitamin (  MULTI-VITAMINS) TABS Take by mouth.    [provider]  oxyCODONE-acetaminophen (PERCOCET) 5-325 MG tablet Take 1 tablet by mouth every 4 (four) hours as needed for severe pain. Patient not taking: Reported on 07/19/2017 07/18/17   Schuyler Amor, MD  ranitidine (ZANTAC) 300 MG capsule Take by mouth. 12/24/13   [provider]  rOPINIRole (REQUIP) 1 MG tablet Take by mouth. 03/31/14   [provider]  temazepam (RESTORIL) 15 MG capsule  05/12/15   [provider]    No Known Allergies  Family History   Problem Relation Age of Onset  . Cancer Brother 21       unknown orgin  . Cancer Sister 73       unknown type    Social History Social History   Tobacco Use  . Smoking status: Former Smoker    Packs/day: 1.00    Years: 36.00    Pack years: 36.00    Types: Cigarettes    Last attempt to quit: 1987    Years since quitting: 32.4  . Smokeless tobacco: Never Used  Substance Use Topics  . Alcohol use: No    Alcohol/week: 0.0 oz  . Drug use: No    Review of Systems Constitutional: Negative for fever. Eyes: Negative for visual complaints ENT: Negative for recent illness/congestion Cardiovascular: Negative for chest pain. Respiratory: Positive for shortness of breath Gastrointestinal: Diffuse abdominal pain/discomfort with nausea.  Negative for vomiting or diarrhea Genitourinary: Negative for urinary compaints Musculoskeletal: Negative for musculoskeletal complaints Skin: Negative for skin complaints  Neurological: Negative for headache All other ROS negative  ____________________________________________   PHYSICAL EXAM:  VITAL SIGNS: ED Triage Vitals  Enc Vitals Group     BP 07/26/17 1021 (!) 143/66     Pulse Rate 07/26/17 1021 94     Resp 07/26/17 1021 (!) 28     Temp 07/26/17 1021 98.4 F (36.9 C)     Temp Source 07/26/17 1021 Oral     SpO2 07/26/17 1021 92 %     Weight 07/26/17 1023 190 lb (86.2 kg)     Height 07/26/17 1023 5\' 10"  (1.778 m)     Head Circumference --      Peak Flow --      Pain Score --      Pain Loc --      Pain Edu? --      Excl. in [place]? --     Constitutional: Alert and oriented.  Moderate tachypnea, appears uncomfortable. Eyes: Normal exam ENT   Head: Normocephalic and atraumatic.   Mouth/Throat: Mucous membranes are moist. Cardiovascular: Normal rate, regular rhythm. No murmur Respiratory: Moderate tachypnea around 30 breaths/min.  No obvious wheeze rales or rhonchi. Gastrointestinal: Soft, mild distention, mild diffuse  tenderness to palpation without rebound or guarding. Musculoskeletal: Nontender with normal range of motion in all extremities.  2+ lower extremity edema, equal bilaterally. Neurologic:  Normal speech and language. No gross focal neurologic deficits  Skin:  Skin is warm, dry and intact.  Psychiatric: Mood and affect are normal.   ____________________________________________    EKG  EKG reviewed and interpreted by myself shows sinus rhythm at 96 bpm, slightly widened QRS, normal axis, largely normal intervals, nonspecific ST changes.  Most consistent with left bundle branch block.  Largely unchanged from prior EKG 07/19/2017  ____________________________________________    RADIOLOGY  Chest x-ray shows vascular congestion area with interstitial edema  CT scan shows signs of metastatic disease widespread hepatic lesions significant lymphadenopathy.  ____________________________________________   INITIAL IMPRESSION / ASSESSMENT AND PLAN / ED COURSE  Pertinent labs & imaging results that were available during my care of the patient were reviewed by me and considered in my medical decision making (see chart for details).  Patient presents to the emergency department for worsening abdominal discomfort, nausea and shortness of breath.  Patient had a recent CT scan of the abdomen showing metastatic disease to the liver, also chest CT.  Patient undergoing evaluation for possible lung versus prostate metastatic cancer has not yet had a biopsy.  Patient states he now has lower extremity edema which he just noticed this morning along with shortness of breath patient breathing approximately 30 times per minute.  Differential would include CHF, pulmonary edema, anasarca, bowel obstruction, ascites.  We will obtain chest x-ray, labs even though the patient had recent CT imaging of the abdomen we will repeat his CT to help exclude small bowel obstruction.  Patient agreeable to plan of care.  We will treat  pain and nausea as well as IV hydrate while awaiting results.  Patient desats to 87% on room air.  Placed on oxygen.  X-ray consistent with interstitial edema along with new onset lower extremity edema we will begin IV Lasix.  We will continue to treat pain.  CT scan of the abdomen although abnormal is not largely changed from prior, highly suspect neoplastic process to be the cause of the patient's discomfort.  CRITICAL CARE Performed by: Harvest Dark   Total critical care time: 30 minutes  Critical care time was exclusive of separately billable procedures and treating other patients.  Critical care was necessary to treat or prevent imminent or life-threatening deterioration.  Critical care was time spent personally by me on the following activities: development of treatment plan with patient and/or surrogate as well as nursing, discussions with consultants, evaluation of patient's response to treatment, examination of patient, obtaining history from patient or surrogate, ordering and performing treatments and interventions, ordering and review of laboratory studies, ordering and review of radiographic studies, pulse oximetry and re-evaluation of patient's condition.  ____________________________________________   FINAL CLINICAL IMPRESSION(S) / ED DIAGNOSES  Abdominal distention Abdominal pain Shortness of breath.  Pulmonary edema. Hypoxia   Harvest Dark, MD 07/26/17 1357    Harvest Dark, MD 08/05/17 [phone]

## 2017-07-26 NOTE — ED Notes (Signed)
Pt states he is having trouble breathing at this time. Pt advised to take some slow deep breaths. Pt exhibiting labored breathing and accesory muscle use. Pt's O2 at 87-88%. O2 not approving, 2L nasal cannula applied at this time, O2 improved to 94%.

## 2017-07-27 DIAGNOSIS — R0602 Shortness of breath: Secondary | ICD-10-CM

## 2017-07-27 DIAGNOSIS — R109 Unspecified abdominal pain: Secondary | ICD-10-CM

## 2017-07-27 DIAGNOSIS — K769 Liver disease, unspecified: Secondary | ICD-10-CM

## 2017-07-27 DIAGNOSIS — Z87891 Personal history of nicotine dependence: Secondary | ICD-10-CM

## 2017-07-27 DIAGNOSIS — J810 Acute pulmonary edema: Secondary | ICD-10-CM

## 2017-07-27 DIAGNOSIS — R970 Elevated carcinoembryonic antigen [CEA]: Secondary | ICD-10-CM

## 2017-07-27 LAB — CBC WITH DIFFERENTIAL/PLATELET
BASOS ABS: 0.1 10*3/uL (ref 0–0.1)
BASOS PCT: 1 %
EOS PCT: 3 %
Eosinophils Absolute: 0.5 10*3/uL (ref 0–0.7)
HEMATOCRIT: 36.3 % — AB (ref 40.0–52.0)
Hemoglobin: 12.2 g/dL — ABNORMAL LOW (ref 13.0–18.0)
Lymphocytes Relative: 3 %
Lymphs Abs: 0.5 10*3/uL — ABNORMAL LOW (ref 1.0–3.6)
MCH: 34.1 pg — ABNORMAL HIGH (ref 26.0–34.0)
MCHC: 33.5 g/dL (ref 32.0–36.0)
MCV: 101.7 fL — ABNORMAL HIGH (ref 80.0–100.0)
MONO ABS: 1.9 10*3/uL — AB (ref 0.2–1.0)
MONOS PCT: 13 %
NEUTROS ABS: 12.4 10*3/uL — AB (ref 1.4–6.5)
Neutrophils Relative %: 80 %
Platelets: 169 10*3/uL (ref 150–440)
RBC: 3.57 MIL/uL — ABNORMAL LOW (ref 4.40–5.90)
RDW: 14 % (ref 11.5–14.5)
WBC: 15.4 10*3/uL — ABNORMAL HIGH (ref 3.8–10.6)

## 2017-07-27 LAB — BASIC METABOLIC PANEL
ANION GAP: 9 (ref 5–15)
BUN: 38 mg/dL — AB (ref 6–20)
CALCIUM: 7.9 mg/dL — AB (ref 8.9–10.3)
CO2: 24 mmol/L (ref 22–32)
CREATININE: 1.31 mg/dL — AB (ref 0.61–1.24)
Chloride: 107 mmol/L (ref 101–111)
GFR calc Af Amer: 54 mL/min — ABNORMAL LOW (ref >60)
GFR, EST NON AFRICAN AMERICAN: 47 mL/min — AB (ref >60)
GLUCOSE: 138 mg/dL — AB (ref 65–99)
Potassium: 4.1 mmol/L (ref 3.5–5.1)
Sodium: 140 mmol/L (ref 135–145)

## 2017-07-27 LAB — BRAIN NATRIURETIC PEPTIDE: B Natriuretic Peptide: 204 pg/mL — ABNORMAL HIGH (ref 0.0–100.0)

## 2017-07-27 LAB — PROCALCITONIN: Procalcitonin: 2.07 ng/mL

## 2017-07-27 MED ORDER — IPRATROPIUM-ALBUTEROL 0.5-2.5 (3) MG/3ML IN SOLN
3.0000 mL | Freq: Four times a day (QID) | RESPIRATORY_TRACT | Status: DC
  Administered 2017-07-27 (×2): 3 mL via RESPIRATORY_TRACT
  Filled 2017-07-27 (×2): qty 3

## 2017-07-27 MED ORDER — DOCUSATE SODIUM 100 MG PO CAPS
100.0000 mg | ORAL_CAPSULE | Freq: Every day | ORAL | Status: DC | PRN
  Administered 2017-07-28: 100 mg via ORAL
  Filled 2017-07-27: qty 1

## 2017-07-27 MED ORDER — FAMOTIDINE 20 MG PO TABS
20.0000 mg | ORAL_TABLET | Freq: Two times a day (BID) | ORAL | Status: DC
  Administered 2017-07-27 – 2017-07-28 (×4): 20 mg via ORAL
  Filled 2017-07-27 (×4): qty 1

## 2017-07-27 MED ORDER — BUDESONIDE 0.25 MG/2ML IN SUSP
0.2500 mg | Freq: Four times a day (QID) | RESPIRATORY_TRACT | Status: DC
  Administered 2017-07-27 (×2): 0.25 mg via RESPIRATORY_TRACT
  Filled 2017-07-27 (×2): qty 2

## 2017-07-27 MED ORDER — SENNA 8.6 MG PO TABS
2.0000 | ORAL_TABLET | Freq: Every day | ORAL | Status: DC | PRN
Start: 2017-07-27 — End: 2017-07-31
  Administered 2017-07-27 – 2017-07-31 (×3): 17.2 mg via ORAL
  Filled 2017-07-27 (×3): qty 2

## 2017-07-27 NOTE — Progress Notes (Signed)
Dr. Grayland Ormond notified that new order needs to be placed for liver biopsy. MD stated that he will place order later tonight for biopsy to be completed this Friday. Wilnette Kales

## 2017-07-27 NOTE — Progress Notes (Signed)
Pt remained on and off BiPAP overnight. Pt has foley in place.

## 2017-07-27 NOTE — Progress Notes (Signed)
Was on BiPAP earlier this morning.  Presently on nasal cannula oxygen.  Mildly dyspneic at rest.  Complains of right flank pain.  Vitals:   07/27/17 1200 07/27/17 1240 07/27/17 1300 07/27/17 1350  BP: (!) 163/61  (!) 133/50   Pulse: 90 86 87 87  Resp: 19 (!) 22 (!) 24 (!) 24  Temp:      TempSrc:      SpO2: 96% 94% 95% 94%  Weight:      Height:        Gen: Mildly dyspneic appearing at rest HEENT: NCAT, sclerae white, oropharynx normal Neck: No LAN, no JVD noted Lungs: full BS, no wheezes, bibasilar crackles Cardiovascular: Regular, normal rate, no M noted Abdomen: Soft, NT, +BS Ext: Symmetric lower extremity edema Neuro: PERRL, EOMI, motor/sensory grossly intact Skin: No lesions noted  BMP Latest Ref Rng & Units 07/27/2017 07/26/2017 07/26/2017  Glucose 65 - 99 mg/dL 138(H) - 164(H)  BUN 6 - 20 mg/dL 38(H) - 29(H)  Creatinine 0.61 - 1.24 mg/dL 1.31(H) - 1.12  Sodium 135 - 145 mmol/L 140 - 135  Potassium 3.5 - 5.1 mmol/L 4.1 4.0 4.1  Chloride 101 - 111 mmol/L 107 - 104  CO2 22 - 32 mmol/L 24 - 21(L)  Calcium 8.9 - 10.3 mg/dL 7.9(L) - 8.1(L)   CBC Latest Ref Rng & Units 07/27/2017 07/26/2017 07/18/2017  WBC 3.8 - 10.6 K/uL 15.4(H) 16.3(H) 9.9  Hemoglobin 13.0 - 18.0 g/dL 12.2(L) 13.6 12.9(L)  Hematocrit 40.0 - 52.0 % 36.3(L) 39.7(L) 38.5(L)  Platelets 150 - 440 K/uL 169 182 179   Hepatic Function Latest Ref Rng & Units 07/26/2017 07/18/2017 01/24/2014  Total Protein 6.5 - 8.1 g/dL 6.7 6.6 6.8  Albumin 3.5 - 5.0 g/dL 3.2(L) 3.5 3.5  AST 15 - 41 U/L 155(H) 117(H) 19  ALT 17 - 63 U/L 103(H) 102(H) 25  Alk Phosphatase 38 - 126 U/L 127(H) 90 73  Total Bilirubin 0.3 - 1.2 mg/dL 1.9(H) 1.0 0.6   CXR: NNF  IMPRESSION: Advanced age History of prostate, colon, lung cancers CT scan findings consistent with metastases to liver, unknown primary Acute hypoxemic respiratory failure CXR with edema pattern Former smoker  PLAN/REC: Continue diuresis as permitted by BP and renal  function Empiric nebulized steroids and bronchodilators Continue BiPAP as needed Continue supplemental oxygen Watch in SDU through today Liver biopsy previously planned for 6/14 Oncology to see today  Merton Border, MD PCCM service Mobile [phone] Pager [phone] 07/27/2017 2:20 PM

## 2017-07-27 NOTE — Consult Note (Signed)
[place]  [phone] [phone]) [phone]  ID: Mliss Sax OB: 1928-01-29  MR#: [id number]  GGE#:[id number]  Patient Care Team: Idelle Crouch, MD as PCP - General (Internal Medicine) Telford Nab, RN as Registered Nurse  CHIEF COMPLAINT: Multiple liver lesions, likely metastatic disease.  Worsening abdominal pain and shortness of breath.  INTERVAL HISTORY: Patient is an 84 year old male who was recently admitted to the hospital with worsening abdominal pain and distention as well as increasing shortness of breath.  He initially was on BiPAP, but has converted to nasal cannula today.  Patient feels improved, but not back to his baseline.  He still complains of abdominal bloating.  Shortness of breath has improved.  He has no neurologic complaints.  He denies any fevers.  He has a fair appetite, but denies weight loss.  He denies any chest pain or cough.  He has no nausea, vomiting, constipation, diarrhea.  He has no urinary complaints.  Patient offers no further specific complaints today.  REVIEW OF SYSTEMS:   Review of Systems  Constitutional: Positive for malaise/fatigue. Negative for fever and weight loss.  Respiratory: Positive for shortness of breath. Negative for cough.   Cardiovascular: Negative.  Negative for chest pain and leg swelling.  Gastrointestinal: Positive for abdominal pain.  Genitourinary: Negative.  Negative for dysuria.  Musculoskeletal: Negative.   Skin: Negative.  Negative for rash.  Neurological: Positive for weakness. Negative for sensory change and focal weakness.  Psychiatric/Behavioral: Negative.  The patient is not nervous/anxious.     As per HPI. Otherwise, a complete review of systems is negative.  PAST MEDICAL HISTORY: Past Medical History:  Diagnosis Date  . Anemia   . CHF (congestive heart failure) ([place])   . Colon cancer [facility])    He states they removed cancerous polyps.  . Hypercholesteremia   .  Hypertension   . Lung cancer ([place])   . Myocardial infarction ([place])   . Osteoarthritis   . Prostate cancer [facility])    Prostatectomy.    PAST SURGICAL HISTORY: Past Surgical History:  Procedure Laterality Date  . COLON SURGERY    . CORONARY ANGIOPLASTY WITH STENT PLACEMENT    . herniorrhaphy      FAMILY HISTORY: Family History  Problem Relation Age of Onset  . Cancer Brother 34       unknown orgin  . Cancer Sister 73       unknown type    ADVANCED DIRECTIVES (Y/N):  @ADVDIR @  HEALTH MAINTENANCE: Social History   Tobacco Use  . Smoking status: Former Smoker    Packs/day: 1.00    Years: 36.00    Pack years: 36.00    Types: Cigarettes    Last attempt to quit: 1987    Years since quitting: 32.4  . Smokeless tobacco: Never Used  Substance Use Topics  . Alcohol use: No    Alcohol/week: 0.0 oz  . Drug use: No     Colonoscopy:  PAP:  Bone density:  Lipid panel:  No Known Allergies  Current Facility-Administered Medications  Medication Dose Route Frequency Provider Last Rate Last Dose  . acetaminophen (TYLENOL) tablet 650 mg  650 mg Oral Q6H PRN Harrie Foreman, MD       Or  . acetaminophen (TYLENOL) suppository 650 mg  650 mg Rectal Q6H PRN Harrie Foreman, MD      . budesonide (PULMICORT) nebulizer solution 0.25 mg  0.25 mg Nebulization Q6H Wilhelmina Mcardle, MD   0.25 mg at  07/27/17 1402  . famotidine (PEPCID) tablet 20 mg  20 mg Oral BID Pernell Dupre, RPH   20 mg at 07/27/17 0854  . heparin injection 5,000 Units  5,000 Units Subcutaneous Q8H Harrie Foreman, MD   5,000 Units at 07/27/17 1414  . hydrALAZINE (APRESOLINE) injection 10 mg  10 mg Intravenous Q6H PRN Flora Lipps, MD      . iopamidol (ISOVUE-300) 61 % injection 30 mL  30 mL Oral Once PRN Harrie Foreman, MD      . ipratropium-albuterol (DUONEB) 0.5-2.5 (3) MG/3ML nebulizer solution 3 mL  3 mL Nebulization Q6H Wilhelmina Mcardle, MD   3 mL at 07/27/17 1402  . morphine 2 MG/ML injection 1-2  mg  1-2 mg Intravenous Q30 min PRN Flora Lipps, MD   1 mg at 07/27/17 1241  . ondansetron (ZOFRAN) tablet 4 mg  4 mg Oral Q6H PRN Harrie Foreman, MD       Or  . ondansetron [facility]) injection 4 mg  4 mg Intravenous Q6H PRN Harrie Foreman, MD        OBJECTIVE: Vitals:   07/27/17 1400 07/27/17 1500  BP: (!) 137/48 (!) 135/57  Pulse: 87 87  Resp: 20 15  Temp:    SpO2: 93% 94%     Body mass index is 27.33 kg/m.    ECOG FS:3 - Symptomatic, >50% confined to bed  General: Well-developed, well-nourished, no acute distress. Eyes: Pink conjunctiva, anicteric sclera. HEENT: Normocephalic, moist mucous membranes, clear oropharnyx. Lungs: Clear to auscultation bilaterally. Heart: Regular rate and rhythm. No rubs, murmurs, or gallops. Abdomen: Distended, mildly tender. Musculoskeletal: No edema, cyanosis, or clubbing. Neuro: Alert, answering all questions appropriately. Cranial nerves grossly intact. Skin: No rashes or petechiae noted. Psych: Normal affect.  LAB RESULTS:  Lab Results  Component Value Date   NA 140 07/27/2017   K 4.1 07/27/2017   CL 107 07/27/2017   CO2 24 07/27/2017   GLUCOSE 138 (H) 07/27/2017   BUN 38 (H) 07/27/2017   CREATININE 1.31 (H) 07/27/2017   CALCIUM 7.9 (L) 07/27/2017   PROT 6.7 07/26/2017   ALBUMIN 3.2 (L) 07/26/2017   AST 155 (H) 07/26/2017   ALT 103 (H) 07/26/2017   ALKPHOS 127 (H) 07/26/2017   BILITOT 1.9 (H) 07/26/2017   GFRNONAA 47 (L) 07/27/2017   GFRAA 54 (L) 07/27/2017    Lab Results  Component Value Date   WBC 15.4 (H) 07/27/2017   NEUTROABS 12.4 (H) 07/27/2017   HGB 12.2 (L) 07/27/2017   HCT 36.3 (L) 07/27/2017   MCV 101.7 (H) 07/27/2017   PLT 169 07/27/2017     STUDIES: Dg Chest 2 View  Result Date: 07/26/2017 CLINICAL DATA:  Shortness of breath EXAM: CHEST - 2 VIEW COMPARISON:  Chest CT 07/20/2017 FINDINGS: Small bilateral pleural effusions. Heart is borderline in size. Mild vascular congestion and interstitial  prominence within the lungs which could reflect interstitial edema. More confluent left perihilar opacity as seen on prior chest CT. IMPRESSION: Mild vascular congestion and interstitial prominence, possibly interstitial edema. More confluent left perihilar opacity concerning for pneumonia. Small bilateral effusions. Electronically Signed   By: Rolm Baptise M.D.   On: 07/26/2017 11:25   Ct Chest W Contrast  Result Date: 07/21/2017 CLINICAL DATA:  History of lung, prostate and colon cancer. Follow-up exam. EXAM: CT CHEST WITH CONTRAST TECHNIQUE: Multidetector CT imaging of the chest was performed during intravenous contrast administration. CONTRAST:  81mL ISOVUE-300 IOPAMIDOL (ISOVUE-300) INJECTION 61% COMPARISON:  Chest CT 01/14/2017 FINDINGS: Cardiovascular: Normal heart size. Coronary arterial and thoracic aortic vascular calcifications. Interval development of small pericardial effusion. Mediastinum/Nodes: Interval development of a 6 mm right paratracheal lymph node (image 47; series 2), previously 2 mm, a 9 mm precarinal lymph node (image 66; series 2), previously 6 mm and a 7 mm subcarinal lymph node (image 81; series 2), previously 4 mm. Esophagus is normal in appearance. There is a new 22 mm left paratracheal lymph node (image 60; series 2), previously 6 mm. Interval increase in size of 14 mm left hilar lymph node (image 75; series 2), previously 10 mm. Lungs/Pleura: Central airways are patent. Interval development of moderate layering bilateral pleural effusions. Re demonstrated bandlike consolidation left lower lobe (image 85; series 3). Similar 10 x 9 mm irregular left upper lobe nodule (image 36; series 3). Re-demonstrated bilateral predominately peripheral reticular opacities. No pneumothorax. Upper Abdomen: Interval development of innumerable low-attenuation lesions throughout the liver. Reference lesion within the right hepatic lobe measures 5.8 x 5.2 cm (image 184; series 2). Reference lesion  within the left hepatic lobe measures 4.2 x 4.6 cm (image 145; series 2). Musculoskeletal: Patchy sclerotic lesion demonstrated within the T7 vertebral body. Thoracic spine degenerative changes. IMPRESSION: 1. Interval development of innumerable low-attenuation lesions throughout the liver compatible with metastatic disease. 2. Interval development of mediastinal adenopathy most compatible with metastatic disease. 3. Interval development of moderate bilateral pleural effusions. While no definite enhancing pleural nodularity is identified, the possibility of metastatic effusions is not excluded. 4. Patchy sclerotic lesion within the T7 vertebral body raising the possibility of osseous metastatic disease. 5. Similar-appearing bandlike consolidation left lower hemithorax in nodularity within the left upper lung. 6.  Aortic Atherosclerosis (ICD10-I70.0). Electronically Signed   By: Lovey Newcomer M.D.   On: 07/21/2017 09:48   Ct Abdomen Pelvis W Contrast  Result Date: 07/26/2017 CLINICAL DATA:  Abdominal pain and distension. Shortness of breath. Reported history of lung, colon, and prostate carcinoma EXAM: CT ABDOMEN AND PELVIS WITH CONTRAST TECHNIQUE: Multidetector CT imaging of the abdomen and pelvis was performed using the standard protocol following bolus administration of intravenous contrast. CONTRAST:  110mL OMNIPAQUE IOHEXOL 300 MG/ML  SOLN COMPARISON:  July 18, 2017 FINDINGS: Lower chest: There are moderate pleural effusions bilaterally with bibasilar atelectatic change. There is a fairly small pericardial effusion. There are foci of coronary artery calcification. Hepatobiliary: There is widespread hepatic metastatic disease with lesions seen throughout all lobes in segments of the liver. Lesions range in size from as small as 8 mm to as large as 5.9 x 5.2 cm cm. This largest lesion is seen in the posterior segment right lobe. There is a 5.6 x 5.4 cm lesion in the left lobe of the liver near the junction with  the right lobe. There is no appreciable biliary duct dilatation. Gallbladder is contracted. Pancreas: No pancreatic mass or inflammatory focus evident. Spleen: No splenic lesions are evident. Adrenals/Urinary Tract: Adrenals bilaterally appear unremarkable. There are is a cyst in the mid right kidney measuring 0.9 x 0.6 cm. There is a cyst in the posterior mid left kidney measuring 1.0 x 0.7 cm. There are scattered tiny probable cysts elsewhere in the kidneys. No hydronephrosis evident on either side. There is no renal or ureteral calculus on either side. Urinary bladder is midline with wall thickness within normal limits. Stomach/Bowel: There are multiple sigmoid and descending colonic diverticula without diverticulitis. There is no appreciable bowel wall or mesenteric thickening. No evident bowel obstruction. No free  air or portal venous air. Vascular/Lymphatic: There is atherosclerotic calcification throughout the aorta and iliac arteries. There is calcification in major mesenteric arterial vessels proximally without frank obstruction. There is no appreciable abdominal aortic aneurysm. There is adenopathy in the porta hepatis region, slightly larger compared to recent study. Largest lymph node in the porta hepatis region measures 2.9 x 2.0 cm. There is no other adenopathy seen elsewhere. Reproductive: Prostate is absent. No pelvic mass evident. There is scrotal wall thickening with questionable small hydrocele on the right. Other: Appendix appears normal. No abscess or ascites is evident in the abdomen or pelvis. No omental lesions evident. Musculoskeletal: There are multiple foci of degenerative change in the lower thoracic and lumbar regions. Sclerosis in the L2 vertebral body is stable, likely due to arthropathy. Bony metastatic disease not felt to be likely in this area. There is no lytic or destructive bone lesion. No sclerotic appearing metastasis. No intramuscular lesions evident. IMPRESSION: 1. Widespread  hepatic metastatic disease with multiple lesions throughout all lobes in segments. There is also porta hepatis region adenopathy with the largest lymph node in this area slightly larger compared to recent prior study. Neoplastic involvement in this area is felt to be present. No omental lesions evident. 2. Widespread left-sided colonic diverticulosis without diverticulitis. No obvious mass related to bowel evident. No bowel obstruction or inflammatory change. Appendix appears normal. 3.  Prostate absent. 4. Sizable pleural effusions bilaterally with bibasilar atelectasis. Effusions appear larger than on recent study. There is a fairly small pericardial effusion as well. 5. Extensive aortoiliac atherosclerosis. Calcification noted in major mesenteric arterial vessels without frank obstruction. There are foci of coronary artery calcification. 6. Scrotal wall edema in the dependent portion of the scrotum. Question small right scrotal hydrocele. Aortic Atherosclerosis (ICD10-I70.0). Electronically Signed   By: Lowella Grip III M.D.   On: 07/26/2017 11:52   Ct Abdomen Pelvis W Contrast  Result Date: 07/18/2017 CLINICAL DATA:  Prostate cancer. Diffuse abdominal pain for 5 weeks. Constipation. History of lung cancer. EXAM: CT ABDOMEN AND PELVIS WITH CONTRAST TECHNIQUE: Multidetector CT imaging of the abdomen and pelvis was performed using the standard protocol following bolus administration of intravenous contrast. CONTRAST:  180mL ISOVUE-300 IOPAMIDOL (ISOVUE-300) INJECTION 61% COMPARISON:  01/23/2014 01/14/2017 chest CT. PET. No prior dedicated abdominopelvic CT. FINDINGS: Lower chest: Emphysema. Bibasilar atelectasis. Normal heart size with right coronary artery atherosclerosis. Small bilateral pleural effusions are new. Hepatobiliary: Since 01/14/2017, development of widespread hepatic metastasis. Index right hepatic lobe lesion measures 5.4 cm on image 33/2. Index lateral segment left liver lobe lesion  measures 4.9 cm on image 15/2. Normal gallbladder, without biliary ductal dilatation. Pancreas: Normal, without mass or ductal dilatation. Spleen: Normal in size, without focal abnormality. Adrenals/Urinary Tract: Normal adrenal glands. Too small to characterize lesions in both kidneys. Mild renal cortical thinning is within normal variation for age. No hydronephrosis. Stomach/Bowel: Normal stomach, without wall thickening. Extensive colonic diverticulosis. Normal terminal ileum and appendix. Normal small bowel. Vascular/Lymphatic: Advanced aortic and branch vessel atherosclerosis. Porta hepatis adenopathy is new and suspicious. Example gastrohepatic ligament node of 1.7 cm on image 26/2. No pelvic sidewall adenopathy. Reproductive: Prostatectomy, without locally recurrent disease. Other: No significant free fluid. No evidence of omental or peritoneal disease. Musculoskeletal: Lumbosacral spondylosis. IMPRESSION: 1. Hepatic and porta hepatis nodal metastasis. This is most likely from the patient's primary lung cancer or less likely prostate cancer. An unknown primary could look similar. 2. New small bilateral pleural effusions. 3. Coronary artery atherosclerosis. Aortic Atherosclerosis (ICD10-I70.0).  Electronically Signed   By: Abigail Miyamoto M.D.   On: 07/18/2017 18:31    ASSESSMENT: Multiple liver lesions, likely metastatic disease.  Worsening abdominal pain and shortness of breath.  PLAN:    1.  Liver lesions: Patient has ultrasound-guided biopsy scheduled for this Friday.  This likely can be accomplished as inpatient.  Patient's CEA is mildly elevated.  Suspicious for either colon or lung cancer.  No further intervention is needed.  Keep follow-up with Dr. Rogue Bussing as scheduled. 2.  Abdominal pain: Likely related to underlying metastatic disease.  Biopsy as above. 3.  Shortness of breath: Improving.  Patient is now on nasal cannula.    Appreciate consult, will follow.  Lloyd Huger, MD    07/27/2017 3:32 PM

## 2017-07-28 ENCOUNTER — Inpatient Hospital Stay: Payer: Medicare Other

## 2017-07-28 ENCOUNTER — Other Ambulatory Visit: Payer: Self-pay | Admitting: Radiology

## 2017-07-28 LAB — CBC
HEMATOCRIT: 36 % — AB (ref 40.0–52.0)
HEMOGLOBIN: 12.3 g/dL — AB (ref 13.0–18.0)
MCH: 34.3 pg — AB (ref 26.0–34.0)
MCHC: 34.2 g/dL (ref 32.0–36.0)
MCV: 100.6 fL — AB (ref 80.0–100.0)
Platelets: 182 10*3/uL (ref 150–440)
RBC: 3.58 MIL/uL — AB (ref 4.40–5.90)
RDW: 13.8 % (ref 11.5–14.5)
WBC: 14 10*3/uL — AB (ref 3.8–10.6)

## 2017-07-28 LAB — PROCALCITONIN: Procalcitonin: 1.43 ng/mL

## 2017-07-28 LAB — COMPREHENSIVE METABOLIC PANEL
ALK PHOS: 96 U/L (ref 38–126)
ALT: 64 U/L — AB (ref 17–63)
AST: 88 U/L — AB (ref 15–41)
Albumin: 2.7 g/dL — ABNORMAL LOW (ref 3.5–5.0)
Anion gap: 9 (ref 5–15)
BILIRUBIN TOTAL: 1 mg/dL (ref 0.3–1.2)
BUN: 40 mg/dL — AB (ref 6–20)
CALCIUM: 7.9 mg/dL — AB (ref 8.9–10.3)
CO2: 24 mmol/L (ref 22–32)
CREATININE: 1.12 mg/dL (ref 0.61–1.24)
Chloride: 102 mmol/L (ref 101–111)
GFR, EST NON AFRICAN AMERICAN: 57 mL/min — AB (ref >60)
Glucose, Bld: 145 mg/dL — ABNORMAL HIGH (ref 65–99)
Potassium: 3.6 mmol/L (ref 3.5–5.1)
Sodium: 135 mmol/L (ref 135–145)
Total Protein: 6.1 g/dL — ABNORMAL LOW (ref 6.5–8.1)

## 2017-07-28 MED ORDER — CARVEDILOL 25 MG PO TABS
25.0000 mg | ORAL_TABLET | Freq: Two times a day (BID) | ORAL | Status: DC
  Administered 2017-07-28 – 2017-07-31 (×5): 25 mg via ORAL
  Filled 2017-07-28 (×5): qty 1

## 2017-07-28 MED ORDER — IPRATROPIUM-ALBUTEROL 0.5-2.5 (3) MG/3ML IN SOLN: 3.0000 mL | Freq: Four times a day (QID) | RESPIRATORY_TRACT | Status: DC | PRN

## 2017-07-28 MED ORDER — SODIUM CHLORIDE 0.9 % IV SOLN
INTRAVENOUS | Status: DC
  Administered 2017-07-29: 09:00:00 via INTRAVENOUS

## 2017-07-28 MED ORDER — MORPHINE SULFATE (PF) 2 MG/ML IV SOLN
1.0000 mg | INTRAVENOUS | Status: DC | PRN
  Administered 2017-07-30: 13:00:00 2 mg via INTRAVENOUS
  Filled 2017-07-28: qty 1

## 2017-07-28 MED ORDER — IPRATROPIUM-ALBUTEROL 0.5-2.5 (3) MG/3ML IN SOLN
3.0000 mL | Freq: Two times a day (BID) | RESPIRATORY_TRACT | Status: DC
  Administered 2017-07-28 – 2017-07-31 (×7): 3 mL via RESPIRATORY_TRACT
  Filled 2017-07-28 (×7): qty 3

## 2017-07-28 MED ORDER — BUDESONIDE 0.25 MG/2ML IN SUSP
0.2500 mg | Freq: Two times a day (BID) | RESPIRATORY_TRACT | Status: DC
  Administered 2017-07-28 – 2017-07-31 (×7): 0.25 mg via RESPIRATORY_TRACT
  Filled 2017-07-28 (×7): qty 2

## 2017-07-28 MED ORDER — FUROSEMIDE 10 MG/ML IJ SOLN
40.0000 mg | Freq: Once | INTRAMUSCULAR | Status: AC
  Administered 2017-07-28: 40 mg via INTRAVENOUS
  Filled 2017-07-28: qty 4

## 2017-07-28 MED ORDER — BENAZEPRIL HCL 20 MG PO TABS
40.0000 mg | ORAL_TABLET | Freq: Every day | ORAL | Status: DC
  Administered 2017-07-28 – 2017-07-31 (×4): 40 mg via ORAL
  Filled 2017-07-28 (×4): qty 2

## 2017-07-28 MED ORDER — POTASSIUM CHLORIDE CRYS ER 20 MEQ PO TBCR
40.0000 meq | EXTENDED_RELEASE_TABLET | Freq: Once | ORAL | Status: AC
  Administered 2017-07-28: 40 meq via ORAL
  Filled 2017-07-28: qty 2

## 2017-07-28 NOTE — Progress Notes (Signed)
[place] [facility] [place] NAME: Bronte Kropf    MR#:  [id number]  DATE OF BIRTH:  Nov 10, 1927  SUBJECTIVE:  CHIEF COMPLAINT:   Chief Complaint  Patient presents with  . Abdominal Pain  . Shortness of Breath  still having abd pain, not very comfortable, BP up REVIEW OF SYSTEMS:  Review of Systems  Constitutional: Negative for chills, fever and weight loss.  HENT: Negative for nosebleeds and sore throat.   Eyes: Negative for blurred vision.  Respiratory: Negative for cough, shortness of breath and wheezing.   Cardiovascular: Negative for chest pain, orthopnea, leg swelling and PND.  Gastrointestinal: Positive for abdominal pain. Negative for constipation, diarrhea, heartburn, nausea and vomiting.  Genitourinary: Negative for dysuria and urgency.  Musculoskeletal: Negative for back pain.  Skin: Negative for rash.  Neurological: Negative for dizziness, speech change, focal weakness and headaches.  Endo/Heme/Allergies: Does not bruise/bleed easily.  Psychiatric/Behavioral: Negative for depression.   DRUG ALLERGIES:  No Known Allergies VITALS:  Blood pressure (!) 172/77, pulse 100, temperature 98.4 F (36.9 C), temperature source Oral, resp. rate (!) 23, height 5\' 10"  (1.778 m), weight 86.4 kg (190 lb 7.6 oz), SpO2 95 %. PHYSICAL EXAMINATION:  Physical Exam  Constitutional: He is oriented to person, place, and time.  HENT:  Head: Normocephalic and atraumatic.  Eyes: Pupils are equal, round, and reactive to light. Conjunctivae and EOM are normal.  Neck: Normal range of motion. Neck supple. No tracheal deviation present. No thyromegaly present.  Cardiovascular: Normal rate, regular rhythm and normal heart sounds.  Pulmonary/Chest: Effort normal and breath sounds normal. No respiratory distress. He has no wheezes. He exhibits no tenderness.  Abdominal: Soft. Bowel sounds are normal. He exhibits distension. There is tenderness.    Musculoskeletal: Normal range of motion.  Neurological: He is alert and oriented to person, place, and time. No cranial nerve deficit.  Skin: Skin is warm and dry. No rash noted.   LABORATORY PANEL:  Male CBC Recent Labs  Lab 07/28/17 0541  WBC 14.0*  HGB 12.3*  HCT 36.0*  PLT 182   ------------------------------------------------------------------------------------------------------------------ Chemistries  Recent Labs  Lab 07/26/17 2202  07/28/17 0541  NA  --    < > 135  K 4.0   < > 3.6  CL  --    < > 102  CO2  --    < > 24  GLUCOSE  --    < > 145*  BUN  --    < > 40*  CREATININE  --    < > 1.12  CALCIUM  --    < > 7.9*  MG 2.3  --   --   AST  --   --  88*  ALT  --   --  64*  ALKPHOS  --   --  96  BILITOT  --   --  1.0   < > = values in this interval not displayed.   RADIOLOGY:  Dg Chest Port 1 View  Result Date: 07/28/2017 CLINICAL DATA:  Respiratory failure EXAM: PORTABLE CHEST 1 VIEW COMPARISON:  07/26/2017 FINDINGS: Worsening bilateral airspace disease most compatible with pulmonary edema. Cardiac enlargement. Mild progression of bibasilar atelectasis left greater than right. Minimal pleural effusion. IMPRESSION: Progression of bilateral airspace disease consistent with worsening heart failure and edema. Electronically Signed   By: Franchot Gallo M.D.   On: 07/28/2017 07:01   ASSESSMENT AND PLAN:  This is an 84 year old male admitted for  SOB initially thought to have pneumonia/sepsis.  1.  Pneumonia: Ruled out. Off Abx. continue oxygen via n.c. off BiPAP now and out of ICU.  Bilateral effusions are small and likely do not need drainage at this time. breathing treatments as needed. Lasix as needed for pulmonary edema.  2.  Sepsis: ruled out  3.  Metastatic disease: Multiple liver lesions worrisome for metastases.  Consult oncology.  History of prostate and colon cancer status post prostatectomy. Patient has ultrasound-guided biopsy scheduled for this Friday  6/14. Patient's CEA is mildly elevated.  Suspicious for either colon or lung cancer.  4.  CAD: Elevated troponin likely due to supply demand ischemia. No MI, hold asa, plavix for planned liver biopsy tomorrow  5.  Hypertension: continue coreg and benazepril. Prn hydralazine  6.  DVT prophylaxis: Heparin  7.  GI prophylaxis: H2 blocker per home regimen        All the records are reviewed and case discussed with Care Management/Social Worker. Management plans discussed with the patient, nursing and they are in agreement.  CODE STATUS: DNR  TOTAL TIME TAKING CARE OF THIS PATIENT: 35 minutes.   More than 50% of the time was spent in counseling/coordination of care: YES  POSSIBLE D/C IN 1-2 DAYS, DEPENDING ON CLINICAL CONDITION.   Max Sane M.D on 07/28/2017 at 3:55 PM  Between 7am to 6pm - Pager - [phone]  After 6pm go to [email or web address] - Proofreader  [facility]  Office  [phone]  CC: Primary care physician; Idelle Crouch, MD  Note: This dictation was prepared with Dragon dictation along with smaller phrase technology. Any transcriptional errors that result from this process are unintentional.

## 2017-07-28 NOTE — Progress Notes (Signed)
Family Meeting Note  Advance Directive:yes  Today a meeting took place with the Patient.  The following clinical team members were present during this meeting:MD  The following were discussed:Patient's diagnosis:  70 y m with suspected metastatic cancer admitted for SOB  Past Medical History:  Diagnosis Date  . Anemia   . CHF (congestive heart failure) ([place])   . Colon cancer [facility])    He states they removed cancerous polyps.  . Hypercholesteremia   . Hypertension   . Lung cancer ([place])   . Myocardial infarction ([place])   . Osteoarthritis   . Prostate cancer [facility])    Prostatectomy.    Patient's progosis: < 12 months and Goals for treatment: DNR   Overall poor prognosis.  Additional follow-up to be provided: Palliative care - consider home with Hospice - metastatic cancer?  Time spent during discussion:20 minutes  Max Sane, MD

## 2017-07-28 NOTE — Progress Notes (Signed)
He has been off of BiPAP for greater than 24 hours.  Presently, comfortable on [place] O2 at 3 LPM.  Current chest x-ray still shows some edema.  I have ordered Lasix (and potassium).  I have placed order for transfer to [place] floor.  After transfer, PCCM service will sign off.  Please call if we can be of further assistance.  Liver biopsy has been arranged for tomorrow.  Merton Border, MD PCCM service Mobile [phone] Pager [phone] 07/28/2017 10:52 AM

## 2017-07-28 NOTE — Progress Notes (Signed)
Patient impulsive and noncompliant with safety/fall interventions.

## 2017-07-29 ENCOUNTER — Inpatient Hospital Stay: Payer: Medicare Other

## 2017-07-29 ENCOUNTER — Ambulatory Visit: Admission: RE | Admit: 2017-07-29 | Payer: Medicare Other | Source: Ambulatory Visit

## 2017-07-29 DIAGNOSIS — Z7189 Other specified counseling: Secondary | ICD-10-CM

## 2017-07-29 DIAGNOSIS — Z515 Encounter for palliative care: Secondary | ICD-10-CM

## 2017-07-29 DIAGNOSIS — R160 Hepatomegaly, not elsewhere classified: Secondary | ICD-10-CM

## 2017-07-29 LAB — BASIC METABOLIC PANEL
Anion gap: 8 (ref 5–15)
BUN: 46 mg/dL — AB (ref 6–20)
CALCIUM: 8.1 mg/dL — AB (ref 8.9–10.3)
CO2: 28 mmol/L (ref 22–32)
CREATININE: 1.46 mg/dL — AB (ref 0.61–1.24)
Chloride: 103 mmol/L (ref 101–111)
GFR calc Af Amer: 48 mL/min — ABNORMAL LOW (ref >60)
GFR, EST NON AFRICAN AMERICAN: 41 mL/min — AB (ref >60)
Glucose, Bld: 126 mg/dL — ABNORMAL HIGH (ref 65–99)
Potassium: 4.4 mmol/L (ref 3.5–5.1)
SODIUM: 139 mmol/L (ref 135–145)

## 2017-07-29 LAB — CBC
HCT: 35.3 % — ABNORMAL LOW (ref 40.0–52.0)
Hemoglobin: 12.1 g/dL — ABNORMAL LOW (ref 13.0–18.0)
MCH: 34.7 pg — ABNORMAL HIGH (ref 26.0–34.0)
MCHC: 34.2 g/dL (ref 32.0–36.0)
MCV: 101.6 fL — ABNORMAL HIGH (ref 80.0–100.0)
PLATELETS: 182 10*3/uL (ref 150–440)
RBC: 3.47 MIL/uL — ABNORMAL LOW (ref 4.40–5.90)
RDW: 14.1 % (ref 11.5–14.5)
WBC: 10.1 10*3/uL (ref 3.8–10.6)

## 2017-07-29 LAB — PROTIME-INR
INR: 1.25
Prothrombin Time: 15.6 seconds — ABNORMAL HIGH (ref 11.4–15.2)

## 2017-07-29 LAB — APTT: aPTT: 41 seconds — ABNORMAL HIGH (ref 24–36)

## 2017-07-29 MED ORDER — MIDAZOLAM HCL 5 MG/5ML IJ SOLN
INTRAMUSCULAR | Status: AC
  Filled 2017-07-29: qty 5

## 2017-07-29 MED ORDER — FAMOTIDINE 20 MG PO TABS
20.0000 mg | ORAL_TABLET | Freq: Every day | ORAL | Status: DC
  Administered 2017-07-30: 20 mg via ORAL
  Filled 2017-07-29: qty 1

## 2017-07-29 MED ORDER — FENTANYL CITRATE (PF) 100 MCG/2ML IJ SOLN
INTRAMUSCULAR | Status: AC
  Filled 2017-07-29: qty 4

## 2017-07-29 MED ORDER — MIDAZOLAM HCL 5 MG/5ML IJ SOLN
INTRAMUSCULAR | Status: AC | PRN
Start: 2017-07-29 — End: 2017-07-29
  Administered 2017-07-29 (×2): 0.5 mg via INTRAVENOUS

## 2017-07-29 MED ORDER — FENTANYL CITRATE (PF) 100 MCG/2ML IJ SOLN
INTRAMUSCULAR | Status: AC | PRN
  Administered 2017-07-29 (×2): 12.5 ug via INTRAVENOUS

## 2017-07-29 NOTE — Consult Note (Signed)
Consultation Note Date: 07/29/2017   Patient Name: Phillip Sanchez  DOB: January 31, 1928  MRN: [id number]  Age / Sex: 84 y.o., male  PCP: Idelle Crouch, MD Referring Physician: Max Sane, MD  Reason for Consultation: Establishing goals of care  HPI/Patient Profile: 84 y.o. male  with past medical history of CHF, colon CA, prostate CA, lung CA, HLD, HTN, MI, and OA admitted on 07/26/2017 with abdominal pain and shortness of breath. Scans reveal multiple liver lesions that are likely metastatic disease. CEA is mildly elevated. Per oncology, suspicious for either colon or lung cancer. He was initially in the ICU on bipap, now on [place] and transferred out of ICU. Respiratory status much improved. Still has small bilateral pleural effusions. He had a liver biopsy done this morning.  PMT consulted for [place].  Clinical Assessment and Goals of Care: I have reviewed medical records including EPIC notes, labs and imaging, received report from RN, assessed the patient and then met at the bedside along with patient's wife, Phillip Sanchez, son, Phillip Sanchez, and daughter, Phillip Sanchez,  to discuss diagnosis prognosis, [place], EOL wishes, disposition and options.  Of note, patient's wife appears to have dementia and was present during conversation but did not participate in [place].   I introduced Palliative Medicine as specialized medical care for people living with serious illness. It focuses on providing relief from the symptoms and stress of a serious illness. The goal is to improve quality of life for both the patient and the family.  We discussed a brief life review of the patient. 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 He has 5 children and several grandchildren.   As far as functional and nutritional status, they tell me he was incredibly functional until about 2 weeks ago. He still drove, mowed the grass, independent in ADLs, and cared for his wife  who appears to have dementia. 2 weeks ago he began having significant abdominal pain. His appetite diminished and he has become progressively weaker.    We discussed his current illness and what it means in the larger context of his on-going co-morbidities.  Natural disease trajectory and expectations at EOL were discussed. Specifically, we discussed concerning findings of CT scan. We also discussed patient's difficulty breathing and pulmonary edema. Patient and family are eager to receive results of liver biopsy and discuss options with oncology.   I attempted to elicit values and goals of care important to the patient.    Patient and family do not feel that they are able to discuss any further care planning until they receive results of biopsy.   Palliative Care services outpatient were explained and offered. They were accepting of palliative care outpatient.   Questions and concerns were addressed. The family was encouraged to call with questions or concerns.   Primary Decision Maker PATIENT  SUMMARY OF RECOMMENDATIONS   - outpatient palliative care referral - patient/family unable to discuss ACP further until they know results of liver biopsy  Code Status/Advance Care Planning:  DNR   Symptom Management:   Per primary - denies symptoms currently  He does tell me he had to receive narcan in ED after morphine - requests morphine not be given again - we discussed smaller doses  Having regular BM  Additional Recommendations (Limitations, Scope, Preferences):  Full Scope Treatment  Psycho-social/Spiritual:   Desire for further Chaplaincy support:no  Additional Recommendations: Education on Hospice  Prognosis:   Unable to determine  Discharge Planning: Home with  Palliative Services      Primary Diagnoses: Present on Admission: . CAP (community acquired pneumonia)   I have reviewed the medical record, interviewed the patient and family, and examined the patient.  The following aspects are pertinent.  Past Medical History:  Diagnosis Date  . Anemia   . CHF (congestive heart failure) ([place])   . Colon cancer [facility])    He states they removed cancerous polyps.  . Hypercholesteremia   . Hypertension   . Lung cancer ([place])   . Myocardial infarction ([place])   . Osteoarthritis   . Prostate cancer [facility])    Prostatectomy.   Social History   Socioeconomic History  . Marital status: Married    Spouse name: Not on file  . Number of children: Not on file  . Years of education: Not on file  . Highest education level: Not on file  Occupational History  . Not on file  Social Needs  . Financial resource strain: Not on file  . Food insecurity:    Worry: Not on file    Inability: Not on file  . Transportation needs:    Medical: Not on file    Non-medical: Not on file  Tobacco Use  . Smoking status: Former Smoker    Packs/day: 1.00    Years: 36.00    Pack years: 36.00    Types: Cigarettes    Last attempt to quit: 1987    Years since quitting: 32.4  . Smokeless tobacco: Never Used  Substance and Sexual Activity  . Alcohol use: No    Alcohol/week: 0.0 oz  . Drug use: No  . Sexual activity: Not Currently  Lifestyle  . Physical activity:    Days per week: Not on file    Minutes per session: Not on file  . Stress: Not on file  Relationships  . Social connections:    Talks on phone: Not on file    Gets together: Not on file    Attends religious service: Not on file    Active member of club or organization: Not on file    Attends meetings of clubs or organizations: Not on file    Relationship status: Not on file  Other Topics Concern  . Not on file  Social History Narrative  . Not on file   Family History  Problem Relation Age of Onset  . Cancer Brother 65       unknown orgin  . Cancer Sister 5       unknown type   Scheduled Meds: . benazepril  40 mg Oral Daily  . budesonide (PULMICORT) nebulizer solution  0.25 mg Nebulization BID    . carvedilol  25 mg Oral BID WC  . [START ON 07/30/2017] famotidine  20 mg Oral QHS  . fentaNYL      . ipratropium-albuterol  3 mL Nebulization BID  . midazolam       Continuous Infusions: . sodium chloride 10 mL/hr at 07/29/17 0853   PRN Meds:.acetaminophen **OR** acetaminophen, docusate sodium, hydrALAZINE, iopamidol, ipratropium-albuterol, morphine injection, ondansetron **OR** ondansetron (ZOFRAN) IV, senna No Known Allergies Review of Systems  Constitutional: Positive for activity change.  All other systems reviewed and are negative.   Physical Exam  Constitutional: He is oriented to person, place, and time. He appears well-developed and well-nourished. No distress.  HENT:  Head: Normocephalic and atraumatic.  Cardiovascular: Normal rate and regular rhythm.  Pulmonary/Chest: Effort normal and breath sounds normal.  Abdominal: Bowel sounds are normal. He exhibits  distension.  Musculoskeletal:  Slight BLE edema  Neurological: He is alert and oriented to person, place, and time.  HOH but seems to be oriented throughout conversation  Skin: Skin is warm and dry.    Vital Signs: BP 130/66   Pulse 85   Temp 98 F (36.7 C) (Oral)   Resp 17   Ht '5\' 10"'  (1.778 m)   Wt 86.8 kg (191 lb 6 oz)   SpO2 97%   BMI 27.46 kg/m  Pain Scale: 0-10 POSS *See Group Information*: 1-Acceptable,Awake and alert Pain Score: 0-No pain   SpO2: SpO2: 97 % O2 Device:SpO2: 97 % O2 Flow Rate: .O2 Flow Rate (L/min): 3 L/min  IO: Intake/output summary:   Intake/Output Summary (Last 24 hours) at 07/29/2017 1256 Last data filed at 07/28/2017 1346 Gross per 24 hour  Intake 240 ml  Output -  Net 240 ml    LBM: Last BM Date: 07/28/17 Baseline Weight: Weight: 86.2 kg (190 lb) Most recent weight: Weight: 86.8 kg (191 lb 6 oz)     Palliative Assessment/Data: PPS 50%     Time Total: 75 minutes Greater than 50%  of this time was spent counseling and coordinating care related to the above  assessment and plan.  Juel Burrow, DNP, AGNP-C Palliative Medicine Team [phone] Pager: [phone]

## 2017-07-29 NOTE — Care Management Important Message (Signed)
Copy of signed IM left with patient in room.  

## 2017-07-29 NOTE — Procedures (Signed)
US guided core biopsies of left hepatic lesion.  3 cores obtained.  Minimal blood loss and no immediate complication.

## 2017-07-29 NOTE — Progress Notes (Signed)
Patient ID: Phillip Sanchez, male   DOB: 03/04/1927, 84 y.o.   MRN: [id number] Scheduled for US guided liver lesion biopsy.  Patient admitted for pneumonia.  H & P has been reviewed.  Patient feels like breathing has improved since admission.  No complaints today. MK:[id number]: CTA; Heart: RRR; Abdomen: soft, non tender Imaging:  Innumerable liver lesions on recent CT A/P:  84 yo with multiple liver lesions and plan for US guided liver lesion biopsy with moderate sedation.   Informed consent obtained from patient.

## 2017-07-29 NOTE — Progress Notes (Signed)
PHARMACY NOTE:  RENAL DOSAGE ADJUSTMENT  Current medication dosage:  Famotidine 20 mg bid  Renal Function: Estimated Creatinine Clearance: 36.1 mL/min (A) (by C-G formula based on SCr of 1.46 mg/dL (H)).    Medication dosage has been changed to:  Famotidine 20 mg daily at bedtime for Crcl < 50 ml/min   Thank you for allowing pharmacy to be a part of this patient's care.  [place], [facility] 07/29/2017 10:21 AM

## 2017-07-29 NOTE — Progress Notes (Signed)
[place] at [place] NAME: Phillip Sanchez    MR#:  [id number]  DATE OF BIRTH:  Feb 22, 1927  SUBJECTIVE:  CHIEF COMPLAINT:   Chief Complaint  Patient presents with  . Abdominal Pain  . Shortness of Breath  abd pain, waiting for US guided liver biopsy, seems confused REVIEW OF SYSTEMS:  Review of Systems  Constitutional: Negative for chills, fever and weight loss.  HENT: Negative for nosebleeds and sore throat.   Eyes: Negative for blurred vision.  Respiratory: Negative for cough, shortness of breath and wheezing.   Cardiovascular: Negative for chest pain, orthopnea, leg swelling and PND.  Gastrointestinal: Positive for abdominal pain. Negative for constipation, diarrhea, heartburn, nausea and vomiting.  Genitourinary: Negative for dysuria and urgency.  Musculoskeletal: Negative for back pain.  Skin: Negative for rash.  Neurological: Negative for dizziness, speech change, focal weakness and headaches.  Endo/Heme/Allergies: Does not bruise/bleed easily.  Psychiatric/Behavioral: Negative for depression.   DRUG ALLERGIES:  No Known Allergies VITALS:  Blood pressure (!) 134/51, pulse 85, temperature 98 F (36.7 C), temperature source Oral, resp. rate 18, height 5\' 10"  (1.778 m), weight 86.8 kg (191 lb 6 oz), SpO2 98 %. PHYSICAL EXAMINATION:  Physical Exam  Constitutional: He is oriented to person, place, and time.  HENT:  Head: Normocephalic and atraumatic.  Eyes: Pupils are equal, round, and reactive to light. Conjunctivae and EOM are normal.  Neck: Normal range of motion. Neck supple. No tracheal deviation present. No thyromegaly present.  Cardiovascular: Normal rate, regular rhythm and normal heart sounds.  Pulmonary/Chest: Effort normal and breath sounds normal. No respiratory distress. He has no wheezes. He exhibits no tenderness.  Abdominal: Soft. Bowel sounds are normal. He exhibits distension. There is tenderness.    Musculoskeletal: Normal range of motion.  Neurological: He is alert and oriented to person, place, and time. No cranial nerve deficit.  Skin: Skin is warm and dry. No rash noted.   LABORATORY PANEL:  Male CBC Recent Labs  Lab 07/29/17 0412  WBC 10.1  HGB 12.1*  HCT 35.3*  PLT 182   ------------------------------------------------------------------------------------------------------------------ Chemistries  Recent Labs  Lab 07/26/17 2202  07/28/17 0541 07/29/17 0412  NA  --    < > 135 139  K 4.0   < > 3.6 4.4  CL  --    < > 102 103  CO2  --    < > 24 28  GLUCOSE  --    < > 145* 126*  BUN  --    < > 40* 46*  CREATININE  --    < > 1.12 1.46*  CALCIUM  --    < > 7.9* 8.1*  MG 2.3  --   --   --   AST  --   --  88*  --   ALT  --   --  64*  --   ALKPHOS  --   --  96  --   BILITOT  --   --  1.0  --    < > = values in this interval not displayed.   RADIOLOGY:  No results found. ASSESSMENT AND PLAN:  This is an 84 year old male admitted for SOB initially thought to have pneumonia/sepsis.  1.  Pneumonia: Ruled out. Off Abx. continue oxygen via n.c. off BiPAP now and out of ICU.  Bilateral effusions are small and likely do not need drainage at this time. breathing treatments as needed. Lasix as needed for  pulmonary edema.  2.  Sepsis: ruled out  3.  Metastatic disease: Multiple liver lesions worrisome for metastases.  Consult oncology.  History of prostate and colon cancer status post prostatectomy. Patient has ultrasound-guided biopsy scheduled for this morning. Patient's CEA is mildly elevated.  Suspicious for either colon or lung cancer.  4.  CAD: Elevated troponin likely due to supply demand ischemia. No MI, hold asa, plavix for planned liver biopsy tomorrow  5.  Hypertension: continue coreg and benazepril. Prn hydralazine  6.  DVT prophylaxis: Heparin  7.  GI prophylaxis: H2 blocker per home regimen    Await Palliative care eval - consider Hospice or Palliative  care at home    All the records are reviewed and case discussed with Care Management/Social Worker. Management plans discussed with the patient, nursing and they are in agreement.  CODE STATUS: DNR  TOTAL TIME TAKING CARE OF THIS PATIENT: 35 minutes.   More than 50% of the time was spent in counseling/coordination of care: YES  POSSIBLE D/C IN 1-2 DAYS, DEPENDING ON CLINICAL CONDITION.   Max Sane M.D on 07/29/2017 at 10:20 AM  Between 7am to 6pm - Pager - [phone]  After 6pm go to [email or web address] - Proofreader  [facility]  Office  [phone]  CC: Primary care physician; Idelle Crouch, MD  Note: This dictation was prepared with Dragon dictation along with smaller phrase technology. Any transcriptional errors that result from this process are unintentional.

## 2017-07-30 LAB — CBC
HCT: 35.3 % — ABNORMAL LOW (ref 40.0–52.0)
Hemoglobin: 12 g/dL — ABNORMAL LOW (ref 13.0–18.0)
MCH: 34.3 pg — ABNORMAL HIGH (ref 26.0–34.0)
MCHC: 34 g/dL (ref 32.0–36.0)
MCV: 100.7 fL — ABNORMAL HIGH (ref 80.0–100.0)
PLATELETS: 194 10*3/uL (ref 150–440)
RBC: 3.5 MIL/uL — AB (ref 4.40–5.90)
RDW: 13.7 % (ref 11.5–14.5)
WBC: 9.8 10*3/uL (ref 3.8–10.6)

## 2017-07-30 LAB — BASIC METABOLIC PANEL
ANION GAP: 9 (ref 5–15)
BUN: 46 mg/dL — ABNORMAL HIGH (ref 6–20)
CALCIUM: 7.9 mg/dL — AB (ref 8.9–10.3)
CO2: 25 mmol/L (ref 22–32)
Chloride: 104 mmol/L (ref 101–111)
Creatinine, Ser: 1.14 mg/dL (ref 0.61–1.24)
GFR, EST NON AFRICAN AMERICAN: 55 mL/min — AB (ref >60)
Glucose, Bld: 138 mg/dL — ABNORMAL HIGH (ref 65–99)
POTASSIUM: 3.9 mmol/L (ref 3.5–5.1)
Sodium: 138 mmol/L (ref 135–145)

## 2017-07-30 MED ORDER — SODIUM CHLORIDE 0.9% FLUSH
3.0000 mL | INTRAVENOUS | Status: DC | PRN
  Administered 2017-07-30 – 2017-07-31 (×3): 3 mL via INTRAVENOUS
  Filled 2017-07-30 (×3): qty 3

## 2017-07-30 MED ORDER — SODIUM CHLORIDE 0.9% FLUSH
3.0000 mL | Freq: Two times a day (BID) | INTRAVENOUS | Status: DC
  Administered 2017-07-30 – 2017-07-31 (×4): 3 mL via INTRAVENOUS

## 2017-07-30 MED ORDER — FUROSEMIDE 10 MG/ML IJ SOLN
40.0000 mg | Freq: Two times a day (BID) | INTRAMUSCULAR | Status: DC
  Administered 2017-07-30 – 2017-07-31 (×3): 40 mg via INTRAVENOUS
  Filled 2017-07-30 (×3): qty 4

## 2017-07-30 NOTE — Progress Notes (Signed)
[place] at [place] NAME: Phillip Sanchez    MR#:  [id number]  DATE OF BIRTH:  17-Jul-1927  SUBJECTIVE:  CHIEF COMPLAINT:   Chief Complaint  Patient presents with  . Abdominal Pain  . Shortness of Breath   Still has SOB. Weakness. Liver biospy done 07/29/2017  REVIEW OF SYSTEMS:  Review of Systems  Constitutional: Negative for chills, fever and weight loss.  HENT: Negative for nosebleeds and sore throat.   Eyes: Negative for blurred vision.  Respiratory: Negative for cough, shortness of breath and wheezing.   Cardiovascular: Negative for chest pain, orthopnea, leg swelling and PND.  Gastrointestinal: Positive for abdominal pain. Negative for constipation, diarrhea, heartburn, nausea and vomiting.  Genitourinary: Negative for dysuria and urgency.  Musculoskeletal: Negative for back pain.  Skin: Negative for rash.  Neurological: Negative for dizziness, speech change, focal weakness and headaches.  Endo/Heme/Allergies: Does not bruise/bleed easily.  Psychiatric/Behavioral: Negative for depression.   DRUG ALLERGIES:  No Known Allergies VITALS:  Blood pressure (!) 143/34, pulse 86, temperature 98.6 F (37 C), temperature source Oral, resp. rate (!) 22, height 5\' 10"  (1.778 m), weight 85.5 kg (188 lb 8 oz), SpO2 92 %. PHYSICAL EXAMINATION:  Physical Exam  Constitutional: He is oriented to person, place, and time.  HENT:  Head: Normocephalic and atraumatic.  Eyes: Pupils are equal, round, and reactive to light. Conjunctivae and EOM are normal.  Neck: Normal range of motion. Neck supple. No tracheal deviation present. No thyromegaly present.  Cardiovascular: Normal rate, regular rhythm and normal heart sounds.  Pulmonary/Chest: Effort normal and breath sounds normal. No respiratory distress. He has no wheezes. He exhibits no tenderness.  Abdominal: Soft. Bowel sounds are normal. There is tenderness.  Musculoskeletal: Normal range of  motion.  Neurological: He is alert and oriented to person, place, and time. No cranial nerve deficit.  Skin: Skin is warm and dry. No rash noted.   LABORATORY PANEL:  Male CBC Recent Labs  Lab 07/30/17 0512  WBC 9.8  HGB 12.0*  HCT 35.3*  PLT 194   ------------------------------------------------------------------------------------------------------------------ Chemistries  Recent Labs  Lab 07/26/17 2202  07/28/17 0541  07/30/17 0512  NA  --    < > 135   < > 138  K 4.0   < > 3.6   < > 3.9  CL  --    < > 102   < > 104  CO2  --    < > 24   < > 25  GLUCOSE  --    < > 145*   < > 138*  BUN  --    < > 40*   < > 46*  CREATININE  --    < > 1.12   < > 1.14  CALCIUM  --    < > 7.9*   < > 7.9*  MG 2.3  --   --   --   --   AST  --   --  88*  --   --   ALT  --   --  64*  --   --   ALKPHOS  --   --  96  --   --   BILITOT  --   --  1.0  --   --    < > = values in this interval not displayed.   RADIOLOGY:  No results found. ASSESSMENT AND PLAN:   This is an 84 year old male admitted for SOB  initially thought to have pneumonia/sepsis.  *  Pneumonia Ruled out. Off Abx. continue oxygen via n.c. off BiPAP now and out of ICU.  Bilateral effusions are small and likely do not need drainage at this time. breathing treatments as needed. Start lasix for pulm edema due to hypoalbuminemia  *  Sepsis: ruled out  *  Metastatic disease: Multiple liver lesions worrisome for metastases.  Consulted oncology.   History of prostate and colon cancer status post prostatectomy. Patient has ultrasound-guided biopsy scheduled for this morning.  Patient's CEA is mildly elevated.  Suspicious for either colon or lung cancer.  * CAD: Elevated troponin likely due to supply demand ischemia. No MI, hold asa, plavix for planned liver biopsy tomorrow  * Hypertension: continue coreg and benazepril. Prn hydralazine  * DVT prophylaxis: Heparin   All the records are reviewed and case discussed with Care  Management/Social Worker. Management plans discussed with the patient, nursing and they are in agreement.  CODE STATUS: DNR  TOTAL TIME TAKING CARE OF THIS PATIENT: 35 minutes.   POSSIBLE D/C IN 1-2 DAYS, DEPENDING ON CLINICAL CONDITION.  Neita Carp M.D on 07/30/2017 at 11:28 AM  Between 7am to 6pm - Pager - [phone]  After 6pm go to [email or web address] - Proofreader  [employer] [facility]  [facility]  Office  [phone]  CC: Primary care physician; Idelle Crouch, MD  Note: This dictation was prepared with Dragon dictation along with smaller phrase technology. Any transcriptional errors that result from this process are unintentional.

## 2017-07-30 NOTE — Progress Notes (Signed)
Pt reports less SOB than on admission but still feels SOB. Lasix IV given with pt education done. Morphine x 1 for RLQ pain with complete relief. Noted slight difficulty in some memory recall; lives at home with family/wife.

## 2017-07-31 MED ORDER — FUROSEMIDE 20 MG PO TABS: 20.0000 mg | ORAL_TABLET | Freq: Every day | ORAL | 0 refills | Status: DC

## 2017-07-31 MED ORDER — POTASSIUM CHLORIDE ER 10 MEQ PO TBCR: 10.0000 meq | EXTENDED_RELEASE_TABLET | Freq: Every day | ORAL | 0 refills | Status: DC

## 2017-07-31 NOTE — Care Management Note (Signed)
Case Management Note  Patient Details  Name: Phillip Sanchez MRN: [id number] Date of Birth: 1927-06-02  Subjective/Objective:       Patient to be discharged today per MD order. Orders for HHPT. Choice provided and patient has selected [facility]. Referral placed with Jermaine from [facility] who agrees to take patient. Currently lives with wife Phillip Sanchez [phone]. Patient uses a walker but no other DME.  PCP is Dr Doy Hutching. [facility] and denies any issues obtaining medications. RNCM to sign off. Ines Bloomer RN BSN RNCM [phone]             Action/Plan:   Expected Discharge Date:  07/31/17               Expected Discharge Plan:     In-House Referral:     Discharge planning Services     Post Acute Care Choice:    Choice offered to:     DME Arranged:    DME Agency:     HH Arranged:    HH Agency:     Status of Service:     If discussed at [employer], dates discussed:    Additional Comments:  Latanya Maudlin, RN 07/31/2017, 11:33 AM

## 2017-07-31 NOTE — Progress Notes (Signed)
Pt reports feeling better today; denies SOB/abdominal pain. Ginger ale tastes good to pt; poor appetite for meals; denies nausea/vomiting. Lungs much improved with lasix therapy. Oral and written AVS instructions given to pt and son with rx's sent to local pharmacy.  Transported to private vehicle via transport chair; released in care of son with discharge home to self/family care with [facility] services.

## 2017-07-31 NOTE — Discharge Instructions (Signed)
Acute Respiratory Distress Syndrome, Adult Acute respiratory distress syndrome is a life-threatening condition in which fluid collects in the lungs. This prevents the lungs from filling with air and passing oxygen into the blood. This can cause the lungs and other vital organs to fail. The condition usually develops following an infection, illness, surgery, or injury. What are the causes? This condition may be caused by:  An infection, such as sepsis or pneumonia.  A serious injury to the head or chest.  Severe bleeding from an injury.  A major surgery.  Breathing in harmful chemicals or smoke.  Blood transfusions.  A blood clot in the lungs.  Breathing in vomit (aspiration).  Near-drowning.  Inflammation of the pancreas (pancreatitis).  A drug overdose.  What are the signs or symptoms? Sudden shortness of breath and rapid breathing are the main symptoms of this condition. Other symptoms may include:  A fast or irregular heartbeat.  Skin, lips, or fingernails that look blue (cyanosis).  Confusion.  Tiredness or loss of energy.  Chest pain, particularly while taking a breath.  Coughing.  Restlessness or anxiety.  Fever. This is usually present if there is an underlying infection, such as pneumonia.  How is this diagnosed? This condition is diagnosed based on:  Your symptoms.  Medical history.  A physical exam. During the exam, your health care provider will listen to your heart and check for crackling or wheezing sounds in your lungs.  You may also have other tests to confirm the diagnosis and measure how well your lungs are working. These may include:  Measuring the amount of oxygen in your blood. Your health care provider will use two methods to do this procedure: ? A small device (pulse oximeter) that is placed on your finger, earlobe, or toe. ? An arterial blood gas test. A sample of blood is taken from an artery and tested for oxygen levels.  Blood  tests.  Chest X-rays or CT scans to look for fluid in the lungs.  Taking a sample of your sputum to test for infection.  Heart test, such as an echocardiogram or electrocardiogram. This is done to rule out any heart problems (such as heart failure) that may be causing your symptoms.  Bronchoscopy. During this test, a thin, flexible tube with a light is passed into the mouth or nose, down the windpipe, and into the lungs.  How is this treated? Treatment depends on the cause of your condition. The goal is to support you while your lungs heal and the underlying cause is treated. Treatment may include:  Oxygen therapy. This may be done through: ? A tube in your nose or a face mask. ? A ventilator. This device helps move air into and out of your lungs through a breathing tube that is inserted into your mouth or nose.  Continuous positive airway pressure (CPAP). This treatment uses mild air pressure to keep the airways open. A mask or other device will be placed over your nose or mouth.  Tracheostomy. During this procedure, a small cut is made in your neck to create an opening to your windpipe. A breathing tube is placed directly into your windpipe. The breathing tube is connected to a ventilator. This is done if you have problems with your airway or if you need a ventilator for a long period of time.  Positioning you to lie on your stomach (prone position).  Medicines, such as: ? Sedatives to help you relax. ? Blood pressure medicines. ? Antibiotics to  treat infection. ? Blood thinners to prevent blood clots. ? Diuretics to help prevent excess fluid.  Fluids and nutrients given through an IV tube.  Wearing compression stockings on your legs to prevent blood clots.  Extra corporeal membrane oxygenation (ECMO). This treatment takes blood outside your body, adds oxygen, and removes carbon dioxide. The blood is then returned to your body. This treatment is only used in severe  cases.  Follow these instructions at home:  Take over-the-counter and prescription medicines only as told by your health care provider.  Do not use any products that contain nicotine or tobacco, such as cigarettes and e-cigarettes. If you need help quitting, ask your health care provider.  Limit alcohol intake to no more than 1 drink per day for nonpregnant women and 2 drinks per day for men. One drink equals 12 oz of beer, 5 oz of wine, or 1 oz of hard liquor.  Ask friends and family to help you if daily activities make you tired.  Attend any pulmonary rehabilitation as told by your health care provider. This may include: ? Education about your condition. ? Exercises. ? Breathing training. ? Counseling. ? Learning techniques to conserve energy. ? Nutrition counseling.  Keep all follow-up visits as told by your health care provider. This is important. Contact a health care provider if:  You become short of breath during activity or while resting.  You develop a cough that does not go away.  You have a fever.  Your symptoms do not get better or they get worse.  You become anxious or depressed. Get help right away if:  You have sudden shortness of breath.  You develop sudden chest pain that does not go away.  You develop a rapid heart rate.  You develop swelling or pain in one of your legs.  You cough up blood.  You have trouble breathing.  Your skin, lips, or fingernails turn blue. These symptoms may represent a serious problem that is an emergency. Do not wait to see if the symptoms will go away. Get medical help right away. Call your local emergency services (911 in the [place] Do not drive yourself to the hospital. Summary  Acute respiratory distress syndrome is a life-threatening condition in which fluid collects in the lungs, which leads the lungs and other vital organs to fail.  This condition usually develops following an infection, illness, surgery, or  injury.  Sudden shortness of breath and rapid breathing are the main symptoms of acute respiratory distress syndrome.  Treatment may include oxygen therapy, continuous positive airway pressure (CPAP), tracheostomy, lying on your stomach (prone position), medicines, fluids and nutrients given through an IV tube, compression stockings, and extra corporeal membrane oxygenation (ECMO). This information is not intended to replace advice given to you by your health care provider. Make sure you discuss any questions you have with your health care provider. Document Released: 02/01/2005 Document Revised: 01/19/2016 Document Reviewed: 01/19/2016 Elsevier Interactive Patient Education  2017 [place].   Abdominal Pain, Adult Many things can cause belly (abdominal) pain. Most times, belly pain is not dangerous. Many cases of belly pain can be watched and treated at home. Sometimes belly pain is serious, though. Your doctor will try to find the cause of your belly pain. Follow these instructions at home:  Take over-the-counter and prescription medicines only as told by your doctor. Do not take medicines that help you poop (laxatives) unless told to by your doctor.  Drink enough fluid to keep your pee (  urine) clear or pale yellow.  Watch your belly pain for any changes.  Keep all follow-up visits as told by your doctor. This is important. Contact a doctor if:  Your belly pain changes or gets worse.  You are not hungry, or you lose weight without trying.  You are having trouble pooping (constipated) or have watery poop (diarrhea) for more than 2-3 days.  You have pain when you pee or poop.  Your belly pain wakes you up at night.  Your pain gets worse with meals, after eating, or with certain foods.  You are throwing up and cannot keep anything down.  You have a fever. Get help right away if:  Your pain does not go away as soon as your doctor says it should.  You cannot stop throwing  up.  Your pain is only in areas of your belly, such as the right side or the left lower part of the belly.  You have bloody or black poop, or poop that looks like tar.  You have very bad pain, cramping, or bloating in your belly.  You have signs of not having enough fluid or water in your body (dehydration), such as: ? Dark pee, very little pee, or no pee. ? Cracked lips. ? Dry mouth. ? Sunken eyes. ? Sleepiness. ? Weakness. This information is not intended to replace advice given to you by your health care provider. Make sure you discuss any questions you have with your health care provider. Document Released: 07/21/2007 Document Revised: 08/22/2015 Document Reviewed: 07/16/2015 Elsevier Interactive Patient Education  2018 [place] salt diet  Activity as tolerated

## 2017-08-02 ENCOUNTER — Other Ambulatory Visit: Payer: Self-pay | Admitting: Internal Medicine

## 2017-08-02 LAB — SURGICAL PATHOLOGY

## 2017-08-04 ENCOUNTER — Inpatient Hospital Stay (HOSPITAL_BASED_OUTPATIENT_CLINIC_OR_DEPARTMENT_OTHER): Payer: Medicare Other | Admitting: Oncology

## 2017-08-04 ENCOUNTER — Encounter: Payer: Self-pay | Admitting: Oncology

## 2017-08-04 ENCOUNTER — Other Ambulatory Visit: Payer: Self-pay

## 2017-08-04 ENCOUNTER — Encounter: Payer: Self-pay | Admitting: *Deleted

## 2017-08-04 ENCOUNTER — Other Ambulatory Visit (INDEPENDENT_AMBULATORY_CARE_PROVIDER_SITE_OTHER): Payer: Self-pay | Admitting: Vascular Surgery

## 2017-08-04 VITALS — BP 138/65 | HR 76 | Temp 98.1°F | Resp 20 | Wt 186.9 lb

## 2017-08-04 DIAGNOSIS — K5909 Other constipation: Secondary | ICD-10-CM | POA: Diagnosis not present

## 2017-08-04 DIAGNOSIS — G893 Neoplasm related pain (acute) (chronic): Secondary | ICD-10-CM

## 2017-08-04 DIAGNOSIS — R5383 Other fatigue: Secondary | ICD-10-CM

## 2017-08-04 DIAGNOSIS — Z85038 Personal history of other malignant neoplasm of large intestine: Secondary | ICD-10-CM

## 2017-08-04 DIAGNOSIS — Z87891 Personal history of nicotine dependence: Secondary | ICD-10-CM | POA: Diagnosis not present

## 2017-08-04 DIAGNOSIS — R630 Anorexia: Secondary | ICD-10-CM | POA: Diagnosis not present

## 2017-08-04 DIAGNOSIS — I10 Essential (primary) hypertension: Secondary | ICD-10-CM

## 2017-08-04 DIAGNOSIS — C3432 Malignant neoplasm of lower lobe, left bronchus or lung: Secondary | ICD-10-CM | POA: Diagnosis present

## 2017-08-04 DIAGNOSIS — Z5112 Encounter for antineoplastic immunotherapy: Secondary | ICD-10-CM | POA: Diagnosis present

## 2017-08-04 DIAGNOSIS — R634 Abnormal weight loss: Secondary | ICD-10-CM | POA: Diagnosis not present

## 2017-08-04 DIAGNOSIS — R140 Abdominal distension (gaseous): Secondary | ICD-10-CM | POA: Diagnosis not present

## 2017-08-04 DIAGNOSIS — Z5111 Encounter for antineoplastic chemotherapy: Secondary | ICD-10-CM | POA: Diagnosis present

## 2017-08-04 DIAGNOSIS — R7989 Other specified abnormal findings of blood chemistry: Secondary | ICD-10-CM | POA: Diagnosis not present

## 2017-08-04 DIAGNOSIS — M7989 Other specified soft tissue disorders: Secondary | ICD-10-CM | POA: Diagnosis not present

## 2017-08-04 DIAGNOSIS — C787 Secondary malignant neoplasm of liver and intrahepatic bile duct: Secondary | ICD-10-CM

## 2017-08-04 DIAGNOSIS — Z8546 Personal history of malignant neoplasm of prostate: Secondary | ICD-10-CM

## 2017-08-04 DIAGNOSIS — C3490 Malignant neoplasm of unspecified part of unspecified bronchus or lung: Secondary | ICD-10-CM

## 2017-08-04 DIAGNOSIS — R109 Unspecified abdominal pain: Secondary | ICD-10-CM | POA: Diagnosis not present

## 2017-08-04 DIAGNOSIS — K769 Liver disease, unspecified: Secondary | ICD-10-CM | POA: Diagnosis not present

## 2017-08-04 HISTORY — DX: Malignant neoplasm of unspecified part of unspecified bronchus or lung: C34.90

## 2017-08-04 MED ORDER — OXYCODONE HCL 10 MG PO TABS: 10.0000 mg | ORAL_TABLET | Freq: Four times a day (QID) | ORAL | 0 refills | Status: DC | PRN

## 2017-08-04 MED ORDER — OXYCODONE-ACETAMINOPHEN 5-325 MG PO TABS: 1.0000 | ORAL_TABLET | ORAL | 0 refills | Status: DC | PRN

## 2017-08-04 MED ORDER — LIDOCAINE-PRILOCAINE 2.5-2.5 % EX CREA: TOPICAL_CREAM | CUTANEOUS | 3 refills | Status: DC

## 2017-08-04 MED ORDER — DRONABINOL 5 MG PO CAPS: 5.0000 mg | ORAL_CAPSULE | Freq: Two times a day (BID) | ORAL | 0 refills | Status: DC

## 2017-08-04 MED ORDER — ONDANSETRON HCL 8 MG PO TABS: 8.0000 mg | ORAL_TABLET | Freq: Two times a day (BID) | ORAL | 1 refills | Status: DC | PRN

## 2017-08-04 NOTE — Progress Notes (Signed)
Pt here for follow up. C/o dull/ ache to abdomen and no appetite. "has been weaker since he got out of the hospital."  Increased edema to both legs "up to the thigh." Currently on lasix but is not able to void much, pt describes it as "a light stream." Requesting refill for Percocet.

## 2017-08-04 NOTE — Progress Notes (Signed)
START ON PATHWAY REGIMEN - Small Cell Lung     Cycles 1 through 4, every 21 days:     Atezolizumab      Carboplatin      Etoposide    Cycles 5 and beyond, every 21 days:     Atezolizumab   **Always confirm dose/schedule in your pharmacy ordering system**  Patient Characteristics: Extensive Stage, First Line Stage Classification: Extensive AJCC T Category: TX AJCC N Category: NX AJCC M Category: M1c AJCC 8 Stage Grouping: IVB Line of therapy: First Line  Intent of Therapy: Non-Curative / Palliative Intent, Discussed with Patient

## 2017-08-04 NOTE — Progress Notes (Signed)
. [place] Follow up Note  Patient Care Team: Idelle Crouch, MD as PCP - General (Internal Medicine) Telford Nab, RN as Registered Nurse  REASON FOR VISIT Follow up for treatment of newly diagnosed small cell lung cancer  Oncology History   # 2015- LLL [place] ca stage I s/p SBRT [Dr.Crystal]  # 3rd June 2019- Multiple liver lesions  # Prostate cancer [ 83 years ago]; s/p surgery  # colon cancer [s/p resection of malignant polyps; Dr/Elliot; No colectomy]   # hx CAD [asprin/plavix]; hx PVD     [name]     HISTORY OF PRESENTING ILLNESS:  Phillip Sanchez 84 y.o.  male has been referred to [place] for further evaluation recommendation for multiple liver lesions. Patient noted to have worsening abdominal pain mostly in the right upper quadrant without any radiation over the last 2 weeks.  Continuous; 4 and a scale of 10.  Denies any nausea vomiting denies any diarrhea.  Complains of intermittent constipation.  Complains of fatigue.  Feels bloated.  Denies any blood in stools or black or stools.  Complains of poor appetite. Patient was evaluated in the emergency room-the CT of the abdomen pelvis that shows multiple liver lesions; innumerable.  No obvious primary source noted on the CT of the abdomen pelvis.  A previous CT scan chest from September 2018-did not show any obvious concerns for malignancy at the time.  INTERVAL HISTORY Phillip Sanchez is a 84 y.o. male who has above history reviewed by me today presents for follow up visit for management of newly discovered small cell lung cancer. I am covering Dr. Rogue Bussing , to see this patient.  Patient oncology history was reviewed.  CT abdomen pelvis with contrast 07/26/2017 independently reviewed.  Image showed widespread hepatic metastatic disease with multiple lesions throughout all lobes in segment.  There is also porta hepatis region adenopathy with the largest lymph nodes.  CT chest  with contrast 6/5 2019 showed interval development of innumerable low-attenuation lesions throughout the liver compatible with metastatic disease.  Interval development of mediastinal adenopathy most compatible with metastatic disease.  Interval development of moderate bilateral pleural effusions.  Patchy sclerotic lesion T7.  Patient is status post ultrasound-guided liver lesion biopsy.  Pathology showed metastatic small cell carcinoma consistent with lung origin.  Patient was accompanied by son to discuss pathology results and management plan.Marland Kitchen   #Fatigue Is getting worse #Right abdominal pain: Getting worse.  Patient reports pain is partially relieved by Percocet that was prescribed at discharge.  Request a refill of pain medication. #Lack of appetite, weight loss: Does not have good appetite.  Abdominal fullness. #Lower extremity edema, not improved by Lasix.  Review of Systems  Constitutional: Positive for malaise/fatigue. Negative for chills, diaphoresis and fever.  HENT: Negative for nosebleeds and sore throat.   Eyes: Negative for double vision.  Respiratory: Positive for shortness of breath (Only on exertion.). Negative for cough, hemoptysis, sputum production and wheezing.   Cardiovascular: Negative for chest pain, palpitations, orthopnea and leg swelling.  Gastrointestinal: Positive for abdominal pain (Right upper quadrant) and constipation (Intermittent constipation). Negative for blood in stool, diarrhea, heartburn, melena, nausea and vomiting.  Genitourinary: Negative for dysuria, frequency and urgency.  Musculoskeletal: Negative for back pain and joint pain.  Skin: Negative.  Negative for itching and rash.  Neurological: Negative for dizziness, tingling, focal weakness, weakness and headaches.  Endo/Heme/Allergies: Does not bruise/bleed easily.  Psychiatric/Behavioral: Negative for depression.  The patient is not nervous/anxious and does not have insomnia.      MEDICAL HISTORY:   Past Medical History:  Diagnosis Date  . Anemia   . CHF (congestive heart failure) ([place])   . [facility] cancer [facility]    He states they removed cancerous polyps.  . Hypercholesteremia   . Hypertension   . Lung cancer ([place])   . Myocardial infarction ([place])   . Osteoarthritis   . Prostate cancer [facility])    Prostatectomy.    SURGICAL HISTORY: Past Surgical History:  Procedure Laterality Date  . COLON SURGERY    . CORONARY ANGIOPLASTY WITH STENT PLACEMENT    . herniorrhaphy      SOCIAL HISTORY: hx of smoking- quit 89; no alcohol; in [place]; wife; 2 boys/girls; eletrconic techician [employer] Social History   Socioeconomic History  . Marital status: Married    Spouse name: Not on file  . Number of children: Not on file  . Years of education: Not on file  . Highest education level: Not on file  Occupational History  . Not on file  Social Needs  . Financial resource strain: Not on file  . Food insecurity:    Worry: Not on file    Inability: Not on file  . Transportation needs:    Medical: Not on file    Non-medical: Not on file  Tobacco Use  . Smoking status: Former Smoker    Packs/day: 1.00    Years: 36.00    Pack years: 36.00    Types: Cigarettes    Last attempt to quit: 1987    Years since quitting: 32.4  . Smokeless tobacco: Never Used  Substance and Sexual Activity  . Alcohol use: No    Alcohol/week: 0.0 oz  . Drug use: No  . Sexual activity: Not Currently  Lifestyle  . Physical activity:    Days per week: Not on file    Minutes per session: Not on file  . Stress: Not on file  Relationships  . Social connections:    Talks on phone: Not on file    Gets together: Not on file    Attends religious service: Not on file    Active member of club or organization: Not on file    Attends meetings of clubs or organizations: Not on file    Relationship status: Not on file  . Intimate partner violence:    Fear of current or ex partner: Not on file    Emotionally abused:  Not on file    Physically abused: Not on file    Forced sexual activity: Not on file  Other Topics Concern  . Not on file  Social History Narrative  . Not on file    FAMILY HISTORY: Family History  Problem Relation Age of Onset  . Cancer Brother 62       unknown orgin  . Cancer Sister 18       unknown type    ALLERGIES:  has No Known Allergies.  MEDICATIONS:  Current Outpatient Medications  Medication Sig Dispense Refill  . amLODipine-benazepril (LOTREL) 10-40 MG capsule Take 1 capsule by mouth daily.    Marland Kitchen aspirin EC 81 MG tablet Take 81 mg by mouth daily.     Marland Kitchen atorvastatin (LIPITOR) 20 MG tablet Take 20 mg by mouth at bedtime.     . carvedilol (COREG) 25 MG tablet Take 25 mg by mouth 2 (two) times daily.     . clopidogrel (PLAVIX) 75 MG  tablet Take 75 mg by mouth daily.     Marland Kitchen docusate sodium (COLACE) 100 MG capsule Take 1 capsule (100 mg total) by mouth daily as needed. (Patient taking differently: Take 100 mg by mouth daily as needed for mild constipation. ) 30 capsule 2  . fexofenadine-pseudoephedrine (ALLEGRA-D 24) 180-240 MG per 24 hr tablet Take 1 tablet by mouth daily as needed (for allergies).     . fluticasone (FLONASE) 50 MCG/ACT nasal spray Place 1 spray into the nose daily as needed for allergies or rhinitis.     . furosemide (LASIX) 20 MG tablet Take 1 tablet (20 mg total) by mouth daily. 30 tablet 0  . levocetirizine (XYZAL) 5 MG tablet Take 5 mg by mouth daily as needed for allergies.     . Multiple Vitamin (MULTI-VITAMINS) TABS Take 1 tablet by mouth daily.     . potassium chloride (K-DUR) 10 MEQ tablet Take 1 tablet (10 mEq total) by mouth daily. 30 tablet 0  . ranitidine (ZANTAC) 300 MG tablet Take 300 mg by mouth at bedtime.    Marland Kitchen rOPINIRole (REQUIP) 1 MG tablet Take 1 mg by mouth at bedtime.     . temazepam (RESTORIL) 15 MG capsule Take 15-30 mg by mouth at bedtime as needed for sleep.     Marland Kitchen dronabinol (MARINOL) 5 MG capsule Take 1 capsule (5 mg total) by  mouth 2 (two) times daily before a meal. 30 capsule 0  . Oxycodone HCl 10 MG TABS Take 1 tablet (10 mg total) by mouth every 6 (six) hours as needed. 30 tablet 0   No current facility-administered medications for this visit.       Marland Kitchen  PHYSICAL EXAMINATION: ECOG PERFORMANCE STATUS: 1 - Symptomatic but completely ambulatory  Vitals:   08/04/17 1358  BP: 138/65  Pulse: 76  Resp: 20  Temp: 98.1 F (36.7 C)  SpO2: 95%   Filed Weights   08/04/17 1358  Weight: 186 lb 14.4 oz (84.8 kg)    Physical Exam  Constitutional: He is oriented to person, place, and time and well-developed, well-nourished, and in no distress. No distress.  HENT:  Head: Normocephalic and atraumatic.  Nose: Nose normal.  Mouth/Throat: Oropharynx is clear and moist. No oropharyngeal exudate.  Eyes: Pupils are equal, round, and reactive to light. EOM are normal. Left eye exhibits no discharge. No scleral icterus.  Neck: Normal range of motion. Neck supple. No JVD present.  Cardiovascular: Normal rate, regular rhythm and normal heart sounds.  No murmur heard. Pulmonary/Chest: Effort normal and breath sounds normal. No respiratory distress. He has no wheezes. He has no rales. He exhibits no tenderness.  Abdominal: Soft. Bowel sounds are normal. He exhibits no distension and no mass. There is hepatosplenomegaly (Liver enlarged-4 fingerbreadths below the costal margin). There is tenderness in the right upper quadrant. There is no rebound and no guarding.  Musculoskeletal: Normal range of motion. He exhibits edema. He exhibits no tenderness.  2+ edema bilateral lower extremity.  Lymphadenopathy:    He has no cervical adenopathy.  Neurological: He is alert and oriented to person, place, and time. No cranial nerve deficit. He exhibits normal muscle tone. Coordination normal.  Skin: Skin is warm and dry. No rash noted. He is not diaphoretic. No erythema.  Psychiatric: Affect and judgment normal.     LABORATORY DATA:   I have reviewed the data as listed Lab Results  Component Value Date   WBC 9.8 07/30/2017   HGB 12.0 (L) 07/30/2017  HCT 35.3 (L) 07/30/2017   MCV 100.7 (H) 07/30/2017   PLT 194 07/30/2017   Recent Labs    07/18/17 1459  07/26/17 1053  07/28/17 0541 07/29/17 0412 07/30/17 0512  NA 139  --  135   < > 135 139 138  K 3.6  --  4.1   < > 3.6 4.4 3.9  CL 107  --  104   < > 102 103 104  CO2 22  --  21*   < > 24 28 25   GLUCOSE 164*  --  164*   < > 145* 126* 138*  BUN 20  --  29*   < > 40* 46* 46*  CREATININE 1.15   < > 1.12   < > 1.12 1.46* 1.14  CALCIUM 8.8*  --  8.1*   < > 7.9* 8.1* 7.9*  GFRNONAA 55*  --  57*   < > 57* 41* 55*  GFRAA >60  --  >60   < > >60 48* >60  PROT 6.6  --  6.7  --  6.1*  --   --   ALBUMIN 3.5  --  3.2*  --  2.7*  --   --   AST 117*  --  155*  --  88*  --   --   ALT 102*  --  103*  --  64*  --   --   ALKPHOS 90  --  127*  --  96  --   --   BILITOT 1.0  --  1.9*  --  1.0  --   --    < > = values in this interval not displayed.    RADIOGRAPHIC STUDIES: I have personally reviewed the radiological images as listed and agreed with the findings in the report. Dg Chest 2 View  Result Date: 07/26/2017 CLINICAL DATA:  Shortness of breath EXAM: CHEST - 2 VIEW COMPARISON:  Chest CT 07/20/2017 FINDINGS: Small bilateral pleural effusions. Heart is borderline in size. Mild vascular congestion and interstitial prominence within the lungs which could reflect interstitial edema. More confluent left perihilar opacity as seen on prior chest CT. IMPRESSION: Mild vascular congestion and interstitial prominence, possibly interstitial edema. More confluent left perihilar opacity concerning for pneumonia. Small bilateral effusions. Electronically Signed   By: Rolm Baptise M.D.   On: 07/26/2017 11:25   Ct Chest W Contrast  Result Date: 07/21/2017 CLINICAL DATA:  History of lung, prostate and colon cancer. Follow-up exam. EXAM: CT CHEST WITH CONTRAST TECHNIQUE: Multidetector CT  imaging of the chest was performed during intravenous contrast administration. CONTRAST:  2mL ISOVUE-300 IOPAMIDOL (ISOVUE-300) INJECTION 61% COMPARISON:  Chest CT 01/14/2017 FINDINGS: Cardiovascular: Normal heart size. Coronary arterial and thoracic aortic vascular calcifications. Interval development of small pericardial effusion. Mediastinum/Nodes: Interval development of a 6 mm right paratracheal lymph node (image 47; series 2), previously 2 mm, a 9 mm precarinal lymph node (image 66; series 2), previously 6 mm and a 7 mm subcarinal lymph node (image 81; series 2), previously 4 mm. Esophagus is normal in appearance. There is a new 22 mm left paratracheal lymph node (image 60; series 2), previously 6 mm. Interval increase in size of 14 mm left hilar lymph node (image 75; series 2), previously 10 mm. Lungs/Pleura: Central airways are patent. Interval development of moderate layering bilateral pleural effusions. Re demonstrated bandlike consolidation left lower lobe (image 85; series 3). Similar 10 x 9 mm irregular left upper lobe nodule (image 36; series 3). Re-demonstrated  bilateral predominately peripheral reticular opacities. No pneumothorax. Upper Abdomen: Interval development of innumerable low-attenuation lesions throughout the liver. Reference lesion within the right hepatic lobe measures 5.8 x 5.2 cm (image 184; series 2). Reference lesion within the left hepatic lobe measures 4.2 x 4.6 cm (image 145; series 2). Musculoskeletal: Patchy sclerotic lesion demonstrated within the T7 vertebral body. Thoracic spine degenerative changes. IMPRESSION: 1. Interval development of innumerable low-attenuation lesions throughout the liver compatible with metastatic disease. 2. Interval development of mediastinal adenopathy most compatible with metastatic disease. 3. Interval development of moderate bilateral pleural effusions. While no definite enhancing pleural nodularity is identified, the possibility of metastatic  effusions is not excluded. 4. Patchy sclerotic lesion within the T7 vertebral body raising the possibility of osseous metastatic disease. 5. Similar-appearing bandlike consolidation left lower hemithorax in nodularity within the left upper lung. 6.  Aortic Atherosclerosis (ICD10-I70.0). Electronically Signed   By: Lovey Newcomer M.D.   On: 07/21/2017 09:48   Ct Abdomen Pelvis W Contrast  Result Date: 07/26/2017 CLINICAL DATA:  Abdominal pain and distension. Shortness of breath. Reported history of lung, colon, and prostate carcinoma EXAM: CT ABDOMEN AND PELVIS WITH CONTRAST TECHNIQUE: Multidetector CT imaging of the abdomen and pelvis was performed using the standard protocol following bolus administration of intravenous contrast. CONTRAST:  140mL OMNIPAQUE IOHEXOL 300 MG/ML  SOLN COMPARISON:  July 18, 2017 FINDINGS: Lower chest: There are moderate pleural effusions bilaterally with bibasilar atelectatic change. There is a fairly small pericardial effusion. There are foci of coronary artery calcification. Hepatobiliary: There is widespread hepatic metastatic disease with lesions seen throughout all lobes in segments of the liver. Lesions range in size from as small as 8 mm to as large as 5.9 x 5.2 cm cm. This largest lesion is seen in the posterior segment right lobe. There is a 5.6 x 5.4 cm lesion in the left lobe of the liver near the junction with the right lobe. There is no appreciable biliary duct dilatation. Gallbladder is contracted. Pancreas: No pancreatic mass or inflammatory focus evident. Spleen: No splenic lesions are evident. Adrenals/Urinary Tract: Adrenals bilaterally appear unremarkable. There are is a cyst in the mid right kidney measuring 0.9 x 0.6 cm. There is a cyst in the posterior mid left kidney measuring 1.0 x 0.7 cm. There are scattered tiny probable cysts elsewhere in the kidneys. No hydronephrosis evident on either side. There is no renal or ureteral calculus on either side. Urinary  bladder is midline with wall thickness within normal limits. Stomach/Bowel: There are multiple sigmoid and descending colonic diverticula without diverticulitis. There is no appreciable bowel wall or mesenteric thickening. No evident bowel obstruction. No free air or portal venous air. Vascular/Lymphatic: There is atherosclerotic calcification throughout the aorta and iliac arteries. There is calcification in major mesenteric arterial vessels proximally without frank obstruction. There is no appreciable abdominal aortic aneurysm. There is adenopathy in the porta hepatis region, slightly larger compared to recent study. Largest lymph node in the porta hepatis region measures 2.9 x 2.0 cm. There is no other adenopathy seen elsewhere. Reproductive: Prostate is absent. No pelvic mass evident. There is scrotal wall thickening with questionable small hydrocele on the right. Other: Appendix appears normal. No abscess or ascites is evident in the abdomen or pelvis. No omental lesions evident. Musculoskeletal: There are multiple foci of degenerative change in the lower thoracic and lumbar regions. Sclerosis in the L2 vertebral body is stable, likely due to arthropathy. Bony metastatic disease not felt to be likely in this  area. There is no lytic or destructive bone lesion. No sclerotic appearing metastasis. No intramuscular lesions evident. IMPRESSION: 1. Widespread hepatic metastatic disease with multiple lesions throughout all lobes in segments. There is also porta hepatis region adenopathy with the largest lymph node in this area slightly larger compared to recent prior study. Neoplastic involvement in this area is felt to be present. No omental lesions evident. 2. Widespread left-sided colonic diverticulosis without diverticulitis. No obvious mass related to bowel evident. No bowel obstruction or inflammatory change. Appendix appears normal. 3.  Prostate absent. 4. Sizable pleural effusions bilaterally with bibasilar  atelectasis. Effusions appear larger than on recent study. There is a fairly small pericardial effusion as well. 5. Extensive aortoiliac atherosclerosis. Calcification noted in major mesenteric arterial vessels without frank obstruction. There are foci of coronary artery calcification. 6. Scrotal wall edema in the dependent portion of the scrotum. Question small right scrotal hydrocele. Aortic Atherosclerosis (ICD10-I70.0). Electronically Signed   By: Lowella Grip III M.D.   On: 07/26/2017 11:52   Ct Abdomen Pelvis W Contrast  Result Date: 07/18/2017 CLINICAL DATA:  Prostate cancer. Diffuse abdominal pain for 5 weeks. Constipation. History of lung cancer. EXAM: CT ABDOMEN AND PELVIS WITH CONTRAST TECHNIQUE: Multidetector CT imaging of the abdomen and pelvis was performed using the standard protocol following bolus administration of intravenous contrast. CONTRAST:  175mL ISOVUE-300 IOPAMIDOL (ISOVUE-300) INJECTION 61% COMPARISON:  01/23/2014 01/14/2017 chest CT. PET. No prior dedicated abdominopelvic CT. FINDINGS: Lower chest: Emphysema. Bibasilar atelectasis. Normal heart size with right coronary artery atherosclerosis. Small bilateral pleural effusions are new. Hepatobiliary: Since 01/14/2017, development of widespread hepatic metastasis. Index right hepatic lobe lesion measures 5.4 cm on image 33/2. Index lateral segment left liver lobe lesion measures 4.9 cm on image 15/2. Normal gallbladder, without biliary ductal dilatation. Pancreas: Normal, without mass or ductal dilatation. Spleen: Normal in size, without focal abnormality. Adrenals/Urinary Tract: Normal adrenal glands. Too small to characterize lesions in both kidneys. Mild renal cortical thinning is within normal variation for age. No hydronephrosis. Stomach/Bowel: Normal stomach, without wall thickening. Extensive colonic diverticulosis. Normal terminal ileum and appendix. Normal small bowel. Vascular/Lymphatic: Advanced aortic and branch vessel  atherosclerosis. Porta hepatis adenopathy is new and suspicious. Example gastrohepatic ligament node of 1.7 cm on image 26/2. No pelvic sidewall adenopathy. Reproductive: Prostatectomy, without locally recurrent disease. Other: No significant free fluid. No evidence of omental or peritoneal disease. Musculoskeletal: Lumbosacral spondylosis. IMPRESSION: 1. Hepatic and porta hepatis nodal metastasis. This is most likely from the patient's primary lung cancer or less likely prostate cancer. An unknown primary could look similar. 2. New small bilateral pleural effusions. 3. Coronary artery atherosclerosis. Aortic Atherosclerosis (ICD10-I70.0). Electronically Signed   By: Abigail Miyamoto M.D.   On: 07/18/2017 18:31   US Biopsy (liver)  Result Date: 07/29/2017 INDICATION: 84 year old with history of colon, lung and prostate cancer. Patient has innumerable liver lesions and needs a tissue diagnosis. EXAM: ULTRASOUND-GUIDED LIVER LESION BIOPSY MEDICATIONS: None. ANESTHESIA/SEDATION: Moderate (conscious) sedation was employed during this procedure. A total of Versed 1.5 mg and Fentanyl 25 mcg was administered intravenously. Moderate Sedation Time: 17 minutes. The patient's level of consciousness and vital signs were monitored continuously by radiology nursing throughout the procedure under my direct supervision. FLUOROSCOPY TIME:  None COMPLICATIONS: None immediate. PROCEDURE: Informed written consent was obtained from the patient after a thorough discussion of the procedural risks, benefits and alternatives. All questions were addressed. A timeout was performed prior to the initiation of the procedure. Liver was evaluated with ultrasound.  Lesion in the left hepatic lobe was selected for biopsy. Anterior abdomen was prepped with chlorhexidine and a sterile field was created. Skin and soft tissues were anesthetized with 1% lidocaine. 72 gauge coaxial needle directed into the left hepatic lobe with ultrasound guidance. Needle  was positioned within the lesion. Three core biopsies obtained with an 18 gauge core device. Gel-Foam slurry was injected through the 17 gauge needle as it was removed. Core specimens placed in formalin. Bandage placed over the puncture site. FINDINGS: Innumerable lesions scattered throughout the liver. Lesion in the anterior and inferior left hepatic lobe was targeted. Needle position was confirmed within the lesion. No significant bleeding or hematoma formation following the core biopsies. IMPRESSION: Ultrasound-guided core biopsy of a left hepatic lesion. Electronically Signed   By: Markus Daft M.D.   On: 07/29/2017 12:01   Dg Chest Port 1 View  Result Date: 07/28/2017 CLINICAL DATA:  Respiratory failure EXAM: PORTABLE CHEST 1 VIEW COMPARISON:  07/26/2017 FINDINGS: Worsening bilateral airspace disease most compatible with pulmonary edema. Cardiac enlargement. Mild progression of bibasilar atelectasis left greater than right. Minimal pleural effusion. IMPRESSION: Progression of bilateral airspace disease consistent with worsening heart failure and edema. Electronically Signed   By: Franchot Gallo M.D.   On: 07/28/2017 07:01    ASSESSMENT & PLAN:  1. Liver metastasis ([place])   2. Small cell lung cancer ([place])   3. Neoplasm related pain   4. Weight loss    #CT image were independently reviewed.  Pathology and image results discussed with patient and his son..   Discussed with patient and his son about the cancer diagnosis, prognosis, extent of the disease. Goal of care: Patient and his son are aware that patient's disease not curable.  Prognosis is poor.  Patient does not have good performance status I discussed about options of palliative chemotherapy or supportive care/comfort care.  Patient prefers to try anti-neoplasm treatment. I recommend chemotherapy plus minus immunotherapy. Obtain brain MRI.  .  # I explained to the patient the risks and benefits of chemotherapy including all but not limited  to hair loss, mouth sore, nausea, vomiting, low blood counts, bleeding, and risk of life threatening infection and even death, secondary malignancy etc.   I discussed the mechanism of action and rationale of using immunotherapy.  The goal of therapy is palliative; and length of treatments are likely ongoing/based upon the results of the scans. Discussed the potential side effects of immunotherapy including but not limited to diarrhea; skin rash; respiratory failure, neurotoxicity, elevated LFTs/endocrine abnormalities etc. Patient voices understanding and willing to proceed chemotherapy.   # Chemotherapy education;Refer to vascular surgeon for Mediport placement. Hopefully the planned start chemotherapy next week. Antiemetics-Zofran and Compazine; EMLA cream sent to pharmacy  # Pain: start Oxycodon 10mg  Q6h as needed. Rx sent to Pharmacy. Further adjustment titration based on pain control. Consider add long acting in the near future as well.  # Weight loss/lack of appetite: start Marinol 5mg  BID. Rx sent to pharmacy.  Hopefully start chemotherapy next week with Dr.Brahmanday. Defer Dr.B to decide if he will receive Carbo/Etoposide +/- Tecentriq, dose reduction per Dr.B,  depending on patient's condition on Day 1 of treatment. .    All questions were answered. The patient knows to call the clinic with any problems, questions or concerns. Total face to face encounter time for this patient visit was 29min. >50% of the time was  spent in counseling and coordination of care.      Earlie Server,  MD 08/04/2017 10:55 PM

## 2017-08-05 ENCOUNTER — Ambulatory Visit
Admission: RE | Admit: 2017-08-05 | Discharge: 2017-08-05 | Disposition: A | Discharge status: Home / Self Care | Payer: Medicare Other | Source: Ambulatory Visit | Attending: Vascular Surgery | Admitting: Vascular Surgery

## 2017-08-05 ENCOUNTER — Telehealth: Payer: Self-pay | Admitting: *Deleted

## 2017-08-05 ENCOUNTER — Encounter: Payer: Self-pay | Admitting: Vascular Surgery

## 2017-08-05 ENCOUNTER — Encounter
Admission: RE | Disposition: A | Discharge status: Home / Self Care | Payer: Self-pay | Source: Ambulatory Visit | Attending: Vascular Surgery

## 2017-08-05 DIAGNOSIS — M1990 Unspecified osteoarthritis, unspecified site: Secondary | ICD-10-CM | POA: Diagnosis not present

## 2017-08-05 DIAGNOSIS — Z9079 Acquired absence of other genital organ(s): Secondary | ICD-10-CM | POA: Diagnosis not present

## 2017-08-05 DIAGNOSIS — C3490 Malignant neoplasm of unspecified part of unspecified bronchus or lung: Secondary | ICD-10-CM

## 2017-08-05 DIAGNOSIS — C3432 Malignant neoplasm of lower lobe, left bronchus or lung: Secondary | ICD-10-CM | POA: Diagnosis not present

## 2017-08-05 DIAGNOSIS — Z79899 Other long term (current) drug therapy: Secondary | ICD-10-CM | POA: Diagnosis not present

## 2017-08-05 DIAGNOSIS — Z809 Family history of malignant neoplasm, unspecified: Secondary | ICD-10-CM | POA: Diagnosis not present

## 2017-08-05 DIAGNOSIS — I509 Heart failure, unspecified: Secondary | ICD-10-CM | POA: Insufficient documentation

## 2017-08-05 DIAGNOSIS — Z7982 Long term (current) use of aspirin: Secondary | ICD-10-CM | POA: Insufficient documentation

## 2017-08-05 DIAGNOSIS — I252 Old myocardial infarction: Secondary | ICD-10-CM | POA: Insufficient documentation

## 2017-08-05 DIAGNOSIS — E7800 Pure hypercholesterolemia, unspecified: Secondary | ICD-10-CM | POA: Diagnosis not present

## 2017-08-05 DIAGNOSIS — G893 Neoplasm related pain (acute) (chronic): Secondary | ICD-10-CM | POA: Diagnosis not present

## 2017-08-05 DIAGNOSIS — Z9889 Other specified postprocedural states: Secondary | ICD-10-CM | POA: Insufficient documentation

## 2017-08-05 DIAGNOSIS — I110 Hypertensive heart disease with heart failure: Secondary | ICD-10-CM | POA: Diagnosis not present

## 2017-08-05 DIAGNOSIS — Z87891 Personal history of nicotine dependence: Secondary | ICD-10-CM | POA: Insufficient documentation

## 2017-08-05 DIAGNOSIS — Z7901 Long term (current) use of anticoagulants: Secondary | ICD-10-CM | POA: Insufficient documentation

## 2017-08-05 DIAGNOSIS — Z955 Presence of coronary angioplasty implant and graft: Secondary | ICD-10-CM | POA: Diagnosis not present

## 2017-08-05 DIAGNOSIS — R634 Abnormal weight loss: Secondary | ICD-10-CM | POA: Insufficient documentation

## 2017-08-05 DIAGNOSIS — C787 Secondary malignant neoplasm of liver and intrahepatic bile duct: Secondary | ICD-10-CM | POA: Diagnosis not present

## 2017-08-05 HISTORY — PX: PORTA CATH INSERTION: CATH118285

## 2017-08-05 SURGERY — PORTA CATH INSERTION
Anesthesia: Moderate Sedation

## 2017-08-05 MED ORDER — LIDOCAINE-EPINEPHRINE (PF) 1 %-1:200000 IJ SOLN
INTRAMUSCULAR | Status: AC
  Filled 2017-08-05: qty 30

## 2017-08-05 MED ORDER — HEPARIN (PORCINE) IN NACL 1000-0.9 UT/500ML-% IV SOLN
INTRAVENOUS | Status: AC
  Filled 2017-08-05: qty 500

## 2017-08-05 MED ORDER — HYDROMORPHONE HCL 1 MG/ML IJ SOLN: 1.0000 mg | Freq: Once | INTRAMUSCULAR | Status: DC | PRN

## 2017-08-05 MED ORDER — SODIUM CHLORIDE 0.9 % IV SOLN
Freq: Once | INTRAVENOUS | Status: DC
  Filled 2017-08-05: qty 2

## 2017-08-05 MED ORDER — SODIUM CHLORIDE 0.9 % IV SOLN: Freq: Once | INTRAVENOUS | Status: DC

## 2017-08-05 MED ORDER — MIDAZOLAM HCL 5 MG/5ML IJ SOLN
INTRAMUSCULAR | Status: AC
  Filled 2017-08-05: qty 5

## 2017-08-05 MED ORDER — CEFAZOLIN SODIUM-DEXTROSE 2-4 GM/100ML-% IV SOLN
2.0000 g | Freq: Once | INTRAVENOUS | Status: AC
  Administered 2017-08-05: 2 g via INTRAVENOUS

## 2017-08-05 MED ORDER — FENTANYL CITRATE (PF) 100 MCG/2ML IJ SOLN
INTRAMUSCULAR | Status: AC
Start: 2017-08-05 — End: ?
  Filled 2017-08-05: qty 2

## 2017-08-05 MED ORDER — FENTANYL CITRATE (PF) 100 MCG/2ML IJ SOLN
INTRAMUSCULAR | Status: DC | PRN
  Administered 2017-08-05: 50 ug via INTRAVENOUS

## 2017-08-05 MED ORDER — SODIUM CHLORIDE 0.9 % IV SOLN
INTRAVENOUS | Status: DC
  Administered 2017-08-05: 10:00:00 via INTRAVENOUS

## 2017-08-05 MED ORDER — MIDAZOLAM HCL 2 MG/2ML IJ SOLN
INTRAMUSCULAR | Status: DC | PRN
  Administered 2017-08-05 (×2): 1 mg via INTRAVENOUS

## 2017-08-05 MED ORDER — ONDANSETRON HCL 4 MG/2ML IJ SOLN: 4.0000 mg | Freq: Four times a day (QID) | INTRAMUSCULAR | Status: DC | PRN

## 2017-08-05 SURGICAL SUPPLY — 13 items
BRUSH SCRUB 4% CHG (MISCELLANEOUS) ×3 IMPLANT
DERMABOND ADVANCED (GAUZE/BANDAGES/DRESSINGS) ×2
DERMABOND ADVANCED .7 DNX12 (GAUZE/BANDAGES/DRESSINGS) ×1 IMPLANT
DRAPE INCISE IOBAN 66X45 STRL (DRAPES) ×6 IMPLANT
GUIDEWIRE ANGLED .035 180CM (WIRE) ×3 IMPLANT
KIT PORT POWER 8FR ISP CVUE (Port) ×3 IMPLANT
NEEDLE ENTRY 21GA 7CM ECHOTIP (NEEDLE) ×3 IMPLANT
PACK ANGIOGRAPHY (CUSTOM PROCEDURE TRAY) ×3 IMPLANT
SET INTRO CAPELLA COAXIAL (SET/KITS/TRAYS/PACK) ×3 IMPLANT
SUT MNCRL AB 4-0 PS2 18 (SUTURE) ×3 IMPLANT
SUT PROLENE 0 CT 1 30 (SUTURE) ×3 IMPLANT
SUT VICRYL+ 3-0 36IN CT-1 (SUTURE) ×3 IMPLANT
TOWEL OR 17X26 4PK STRL BLUE (TOWEL DISPOSABLE) ×3 IMPLANT

## 2017-08-05 NOTE — Telephone Encounter (Signed)
Called pt's son to inform that PET scan on Tues 6/25 has been cancelled since it was not needed per Dr. Tasia Catchings. Pt's son verbalized understanding.

## 2017-08-05 NOTE — Progress Notes (Signed)
  Oncology Nurse Navigator Documentation  Navigator Location: [facility] (08/04/17 0865)   )Navigator Encounter Type: Initial MedOnc (08/04/17 0834)   Abnormal Finding Date: 07/18/17 (08/04/17 7846) Confirmed Diagnosis Date: 08/02/17 (08/04/17 9629)                 Treatment Phase: Pre-Tx/Tx Discussion (08/04/17 5284) Barriers/Navigation Needs: Education;Coordination of Care (08/04/17 1324) Education: Understanding Cancer/ Treatment Options;Newly Diagnosed Cancer Education (08/04/17 4010) Interventions: Coordination of Care;Education (08/04/17 2725)   Coordination of Care: Appts;Radiology;Chemo (08/04/17 3664) Education Method: Verbal;Written (08/04/17 4034)      Acuity: Level 2 (08/04/17 0834)   Acuity Level 2: Initial guidance, education and coordination as needed;Educational needs;Assistance expediting appointments (08/04/17 7425)  met with patient and his son, Phillip Sanchez, after discussing pathology results with Dr. Tasia Catchings. Treatment options discussed with patient by Dr. Tasia Catchings. All questions answered at the time of visit. Pt given resources regarding diagnosis and supportive services available. Pt states that he is very uncomfortable and wants to start treatment soon so he can start to feel better. Dr. Tasia Catchings is sending in pain medication electronically to help with pain relief until pt can start treatment. Pt informed of this and he verbalized understanding. Reviewed upcoming appts with patient and gave contact info in case has any further questions or needs. Pt and son verbalized understanding. Nothing further needed at this time.    Time Spent with Patient: 60 (08/04/17 9563)

## 2017-08-05 NOTE — Op Note (Signed)
OPERATIVE NOTE   PROCEDURE: 1. Placement of a right IJ Infuse-a-Port  PRE-OPERATIVE DIAGNOSIS: Small cell lung carcinoma metastatic  POST-OPERATIVE DIAGNOSIS: Same  SURGEON: Katha Cabal M.D.  ANESTHESIA: Conscious sedation was administered under my direct supervision by the interventional radiology RN. IV Versed plus fentanyl were utilized. Continuous ECG, pulse oximetry and blood pressure was monitored throughout the entire procedure. Conscious sedation was for a total of 31 minutes.  ESTIMATED BLOOD LOSS: Minimal   FINDING(S): 1.  Patent vein  SPECIMEN(S): None  INDICATIONS:   Phillip Sanchez is a 84 y.o. male who presents with small cell lung carcinoma which is metastatic.  He is elected to pursue palliative chemotherapy and therefore requires appropriate IV access.  Infuse-a-Port will be placed.  Risks and benefits are reviewed patient agrees to proceed.  DESCRIPTION: After obtaining full informed written consent, the patient was brought back to the special procedure suite and placed in the supine position. The patient's right neck and chest wall are prepped and draped in sterile fashion. Appropriate timeout was called.  Ultrasound is placed in a sterile sleeve, ultrasound is utilized to avoid vascular injury as well as secondary to lack of appropriate landmarks. The right internal jugular vein is identified. It is echolucent and homogeneous as well as easily compressible indicating patency. An image is recorded for the permanent record.  Access to the vein with a micropuncture needle is done under direct ultrasound visualization.  1% lidocaine is infiltrated into the soft tissue at the base of the neck as well as on the chest wall.  Under direct ultrasound visualization a micro-needle is inserted into the vein followed by the micro-wire. Micro-sheath was then advanced and a J wire is inserted without difficulty under fluoroscopic guidance. A small counterincision was created at  the wire insertion site. A transverse incision is created 2 fingerbreadths below the scapula and a pocket is fashioned using both blunt and sharp dissection. The pocket is tested for appropriate size with the hub of the Infuse-a-Port. The tunneling device is then used to pull the intravascular portion of the catheter from the pocket to the neck counterincision.  Dilator and peel-away sheath were then inserted over the wire and the wire is removed. Catheter is then advanced into the venous system without difficulty. Peel-away sheath was then removed.  Catheter is then positioned under fluoroscopic guidance at the atrial caval junction. It is then transected connected to the hub and the hope is slipped into the subcutaneous pocket on the chest wall. The hub was then accessed percutaneously and aspirates easily and flushes well and is flushed with 30 cc of heparinized saline. The pocket incision is then closed in layers using interrupted 3-0 Vicryl for the subcutaneous tissues and 4-0 Monocryl subcuticular for skin closure. Dermabond is applied. The neck counterincision was closed with 4-0 Monocryl subcuticular and Dermabond as well.  The patient tolerated the procedure well and there were no immediate complications.  COMPLICATIONS: None  CONDITION: Unchanged  Katha Cabal M.D. [facility] Office: [phone]   08/05/2017, 1:02 PM

## 2017-08-05 NOTE — Progress Notes (Signed)
Patient post port placement per Dr Delana Meyer, vitals stable. Eating lunch. Daughter at bedside. Denies complaints. Discharge instructions given with questions answered.

## 2017-08-05 NOTE — H&P (Signed)
[facility] History & Physical Update  The patient was interviewed and re-examined.  The patient's previous History and Physical has been reviewed and is unchanged.  There is no change in the plan of care. We plan to proceed with the scheduled procedure.  Hortencia Pilar, MD  08/05/2017, 12:10 PM

## 2017-08-05 NOTE — Discharge Instructions (Signed)
Incision Care, Adult °An incision is a cut that a doctor makes in your skin for surgery (for a procedure). Most times, these cuts are closed after surgery. Your cut from surgery may be closed with stitches (sutures), staples, skin glue, or skin tape (adhesive strips). You may need to return to your doctor to have stitches or staples taken out. This may happen many days or many weeks after your surgery. The cut needs to be well cared for so it does not get infected. °How to care for your cut °Cut care °· Follow instructions from your doctor about how to take care of your cut. Make sure you: °? Wash your hands with soap and water before you change your bandage (dressing). If you cannot use soap and water, use hand sanitizer. °? Change your bandage as told by your doctor. °? Leave stitches, skin glue, or skin tape in place. They may need to stay in place for 2 weeks or longer. If tape strips get loose and curl up, you may trim the loose edges. Do not remove tape strips completely unless your doctor says it is okay. °· Check your cut area every day for signs of infection. Check for: °? More redness, swelling, or pain. °? More fluid or blood. °? Warmth. °? Pus or a bad smell. °· Ask your doctor how to clean the cut. This may include: °? Using mild soap and water. °? Using a clean towel to pat the cut dry after you clean it. °? Putting a cream or ointment on the cut. Do this only as told by your doctor. °? Covering the cut with a clean bandage. °· Ask your doctor when you can leave the cut uncovered. °· Do not take baths, swim, or use a hot tub until your doctor says it is okay. Ask your doctor if you can take showers. You may only be allowed to take sponge baths for bathing. °Medicines °· If you were prescribed an antibiotic medicine, cream, or ointment, take the antibiotic or put it on the cut as told by your doctor. Do not stop taking or putting on the antibiotic even if your condition gets better. °· Take  over-the-counter and prescription medicines only as told by your doctor. °General instructions °· Limit movement around your cut. This helps healing. °? Avoid straining, lifting, or exercise for the first month, or for as long as told by your doctor. °? Follow instructions from your doctor about going back to your normal activities. °? Ask your doctor what activities are safe. °· Protect your cut from the sun when you are outside for the first 6 months, or for as long as told by your doctor. Put on sunscreen around the scar or cover up the scar. °· Keep all follow-up visits as told by your doctor. This is important. °Contact a doctor if: °· Your have more redness, swelling, or pain around the cut. °· You have more fluid or blood coming from the cut. °· Your cut feels warm to the touch. °· You have pus or a bad smell coming from the cut. °· You have a fever or shaking chills. °· You feel sick to your stomach (nauseous) or you throw up (vomit). °· You are dizzy. °· Your stitches or staples come undone. °Get help right away if: °· You have a red streak coming from your cut. °· Your cut bleeds through the bandage and the bleeding does not stop with gentle pressure. °· The edges of your cut open   up and separate. °· You have very bad (severe) pain. °· You have a rash. °· You are confused. °· You pass out (faint). °· You have trouble breathing and you have a fast heartbeat. °This information is not intended to replace advice given to you by your health care provider. Make sure you discuss any questions you have with your health care provider. °Document Released: 04/26/2011 Document Revised: 10/10/2015 Document Reviewed: 10/10/2015 °Elsevier Interactive Patient Education © 2017 Elsevier Inc. ° °

## 2017-08-08 NOTE — Patient Instructions (Signed)
Etoposide, VP-16 injection What is this medicine? ETOPOSIDE, VP-16 (e toe POE side) is a chemotherapy drug. It is used to treat testicular cancer, lung cancer, and other cancers. This medicine may be used for other purposes; ask your health care provider or pharmacist if you have questions. COMMON BRAND NAME(S): Etopophos, Toposar, VePesid What should I tell my health care provider before I take this medicine? They need to know if you have any of these conditions: -infection -kidney disease -liver disease -low blood counts, like low white cell, platelet, or red cell counts -an unusual or allergic reaction to etoposide, other medicines, foods, dyes, or preservatives -pregnant or trying to get pregnant -breast-feeding How should I use this medicine? This medicine is for infusion into a vein. It is administered in a hospital or clinic by a specially trained health care professional. Talk to your pediatrician regarding the use of this medicine in children. Special care may be needed. Overdosage: If you think you have taken too much of this medicine contact a poison control center or emergency room at once. NOTE: This medicine is only for you. Do not share this medicine with others. What if I miss a dose? It is important not to miss your dose. Call your doctor or health care professional if you are unable to keep an appointment. What may interact with this medicine? -aspirin -certain medications for seizures like carbamazepine, phenobarbital, phenytoin, valproic acid -cyclosporine -levamisole -warfarin This list may not describe all possible interactions. Give your health care provider a list of all the medicines, herbs, non-prescription drugs, or dietary supplements you use. Also tell them if you smoke, drink alcohol, or use illegal drugs. Some items may interact with your medicine. What should I watch for while using this medicine? Visit your doctor for checks on your progress. This drug  may make you feel generally unwell. This is not uncommon, as chemotherapy can affect healthy cells as well as cancer cells. Report any side effects. Continue your course of treatment even though you feel ill unless your doctor tells you to stop. In some cases, you may be given additional medicines to help with side effects. Follow all directions for their use. Call your doctor or health care professional for advice if you get a fever, chills or sore throat, or other symptoms of a cold or flu. Do not treat yourself. This drug decreases your body's ability to fight infections. Try to avoid being around people who are sick. This medicine may increase your risk to bruise or bleed. Call your doctor or health care professional if you notice any unusual bleeding. Talk to your doctor about your risk of cancer. You may be more at risk for certain types of cancers if you take this medicine. Do not become pregnant while taking this medicine or for at least 6 months after stopping it. Women should inform their doctor if they wish to become pregnant or think they might be pregnant. Women of child-bearing potential will need to have a negative pregnancy test before starting this medicine. There is a potential for serious side effects to an unborn child. Talk to your health care professional or pharmacist for more information. Do not breast-feed an infant while taking this medicine. Men must use a latex condom during sexual contact with a woman while taking this medicine and for at least 4 months after stopping it. A latex condom is needed even if you have had a vasectomy. Contact your doctor right away if your partner becomes pregnant. Do   not donate sperm while taking this medicine and for at least 4 months after you stop taking this medicine. Men should inform their doctors if they wish to father a child. This medicine may lower sperm counts. What side effects may I notice from receiving this medicine? Side effects that  you should report to your doctor or health care professional as soon as possible: -allergic reactions like skin rash, itching or hives, swelling of the face, lips, or tongue -low blood counts - this medicine may decrease the number of white blood cells, red blood cells and platelets. You may be at increased risk for infections and bleeding. -signs of infection - fever or chills, cough, sore throat, pain or difficulty passing urine -signs of decreased platelets or bleeding - bruising, pinpoint red spots on the skin, black, tarry stools, blood in the urine -signs of decreased red blood cells - unusually weak or tired, fainting spells, lightheadedness -breathing problems -changes in vision -mouth or throat sores or ulcers -pain, redness, swelling or irritation at the injection site -pain, tingling, numbness in the hands or feet -redness, blistering, peeling or loosening of the skin, including inside the mouth -seizures -vomiting Side effects that usually do not require medical attention (report to your doctor or health care professional if they continue or are bothersome): -diarrhea -hair loss -loss of appetite -nausea -stomach pain This list may not describe all possible side effects. Call your doctor for medical advice about side effects. You may report side effects to FDA at 1-800-FDA-1088. Where should I keep my medicine? This drug is given in a hospital or clinic and will not be stored at home. NOTE: This sheet is a summary. It may not cover all possible information. If you have questions about this medicine, talk to your doctor, pharmacist, or health care provider.  2018 Elsevier/Gold Standard (2015-01-24 11:53:23) Carboplatin injection What is this medicine? CARBOPLATIN (KAR boe pla tin) is a chemotherapy drug. It targets fast dividing cells, like cancer cells, and causes these cells to die. This medicine is used to treat ovarian cancer and many other cancers. This medicine may be  used for other purposes; ask your health care provider or pharmacist if you have questions. COMMON BRAND NAME(S): Paraplatin What should I tell my health care provider before I take this medicine? They need to know if you have any of these conditions: -blood disorders -hearing problems -kidney disease -recent or ongoing radiation therapy -an unusual or allergic reaction to carboplatin, cisplatin, other chemotherapy, other medicines, foods, dyes, or preservatives -pregnant or trying to get pregnant -breast-feeding How should I use this medicine? This drug is usually given as an infusion into a vein. It is administered in a hospital or clinic by a specially trained health care professional. Talk to your pediatrician regarding the use of this medicine in children. Special care may be needed. Overdosage: If you think you have taken too much of this medicine contact a poison control center or emergency room at once. NOTE: This medicine is only for you. Do not share this medicine with others. What if I miss a dose? It is important not to miss a dose. Call your doctor or health care professional if you are unable to keep an appointment. What may interact with this medicine? -medicines for seizures -medicines to increase blood counts like filgrastim, pegfilgrastim, sargramostim -some antibiotics like amikacin, gentamicin, neomycin, streptomycin, tobramycin -vaccines Talk to your doctor or health care professional before taking any of these medicines: -acetaminophen -aspirin -ibuprofen -ketoprofen -naproxen  This list may not describe all possible interactions. Give your health care provider a list of all the medicines, herbs, non-prescription drugs, or dietary supplements you use. Also tell them if you smoke, drink alcohol, or use illegal drugs. Some items may interact with your medicine. What should I watch for while using this medicine? Your condition will be monitored carefully while you are  receiving this medicine. You will need important blood work done while you are taking this medicine. This drug may make you feel generally unwell. This is not uncommon, as chemotherapy can affect healthy cells as well as cancer cells. Report any side effects. Continue your course of treatment even though you feel ill unless your doctor tells you to stop. In some cases, you may be given additional medicines to help with side effects. Follow all directions for their use. Call your doctor or health care professional for advice if you get a fever, chills or sore throat, or other symptoms of a cold or flu. Do not treat yourself. This drug decreases your body's ability to fight infections. Try to avoid being around people who are sick. This medicine may increase your risk to bruise or bleed. Call your doctor or health care professional if you notice any unusual bleeding. Be careful brushing and flossing your teeth or using a toothpick because you may get an infection or bleed more easily. If you have any dental work done, tell your dentist you are receiving this medicine. Avoid taking products that contain aspirin, acetaminophen, ibuprofen, naproxen, or ketoprofen unless instructed by your doctor. These medicines may hide a fever. Do not become pregnant while taking this medicine. Women should inform their doctor if they wish to become pregnant or think they might be pregnant. There is a potential for serious side effects to an unborn child. Talk to your health care professional or pharmacist for more information. Do not breast-feed an infant while taking this medicine. What side effects may I notice from receiving this medicine? Side effects that you should report to your doctor or health care professional as soon as possible: -allergic reactions like skin rash, itching or hives, swelling of the face, lips, or tongue -signs of infection - fever or chills, cough, sore throat, pain or difficulty passing  urine -signs of decreased platelets or bleeding - bruising, pinpoint red spots on the skin, black, tarry stools, nosebleeds -signs of decreased red blood cells - unusually weak or tired, fainting spells, lightheadedness -breathing problems -changes in hearing -changes in vision -chest pain -high blood pressure -low blood counts - This drug may decrease the number of white blood cells, red blood cells and platelets. You may be at increased risk for infections and bleeding. -nausea and vomiting -pain, swelling, redness or irritation at the injection site -pain, tingling, numbness in the hands or feet -problems with balance, talking, walking -trouble passing urine or change in the amount of urine Side effects that usually do not require medical attention (report to your doctor or health care professional if they continue or are bothersome): -hair loss -loss of appetite -metallic taste in the mouth or changes in taste This list may not describe all possible side effects. Call your doctor for medical advice about side effects. You may report side effects to FDA at 1-800-FDA-1088. Where should I keep my medicine? This drug is given in a hospital or clinic and will not be stored at home. NOTE: This sheet is a summary. It may not cover all possible information. If you have  questions about this medicine, talk to your doctor, pharmacist, or health care provider.  2018 Elsevier/Gold Standard (2007-05-09 14:38:05)

## 2017-08-09 ENCOUNTER — Encounter: Payer: Self-pay | Admitting: Nurse Practitioner

## 2017-08-09 ENCOUNTER — Inpatient Hospital Stay (HOSPITAL_BASED_OUTPATIENT_CLINIC_OR_DEPARTMENT_OTHER): Payer: Medicare Other | Admitting: Nurse Practitioner

## 2017-08-09 ENCOUNTER — Inpatient Hospital Stay: Payer: Medicare Other

## 2017-08-09 ENCOUNTER — Other Ambulatory Visit: Payer: Medicare Other

## 2017-08-09 VITALS — BP 127/68 | HR 64 | Temp 97.9°F | Resp 16

## 2017-08-09 DIAGNOSIS — C787 Secondary malignant neoplasm of liver and intrahepatic bile duct: Secondary | ICD-10-CM

## 2017-08-09 DIAGNOSIS — Z8546 Personal history of malignant neoplasm of prostate: Secondary | ICD-10-CM

## 2017-08-09 DIAGNOSIS — K5909 Other constipation: Secondary | ICD-10-CM | POA: Diagnosis not present

## 2017-08-09 DIAGNOSIS — Z7189 Other specified counseling: Secondary | ICD-10-CM

## 2017-08-09 DIAGNOSIS — R5383 Other fatigue: Secondary | ICD-10-CM

## 2017-08-09 DIAGNOSIS — Z87891 Personal history of nicotine dependence: Secondary | ICD-10-CM

## 2017-08-09 DIAGNOSIS — I10 Essential (primary) hypertension: Secondary | ICD-10-CM

## 2017-08-09 DIAGNOSIS — C3432 Malignant neoplasm of lower lobe, left bronchus or lung: Secondary | ICD-10-CM

## 2017-08-09 DIAGNOSIS — Z5111 Encounter for antineoplastic chemotherapy: Secondary | ICD-10-CM | POA: Diagnosis not present

## 2017-08-09 DIAGNOSIS — R109 Unspecified abdominal pain: Secondary | ICD-10-CM

## 2017-08-09 DIAGNOSIS — K5900 Constipation, unspecified: Secondary | ICD-10-CM

## 2017-08-09 DIAGNOSIS — Z85038 Personal history of other malignant neoplasm of large intestine: Secondary | ICD-10-CM | POA: Diagnosis not present

## 2017-08-09 DIAGNOSIS — M7989 Other specified soft tissue disorders: Secondary | ICD-10-CM | POA: Diagnosis not present

## 2017-08-09 DIAGNOSIS — C3490 Malignant neoplasm of unspecified part of unspecified bronchus or lung: Secondary | ICD-10-CM

## 2017-08-09 DIAGNOSIS — K219 Gastro-esophageal reflux disease without esophagitis: Secondary | ICD-10-CM

## 2017-08-09 MED ORDER — ESOMEPRAZOLE MAGNESIUM 20 MG PO CPDR
20.0000 mg | DELAYED_RELEASE_CAPSULE | Freq: Every day | ORAL | 1 refills | Status: DC
Start: 2017-08-09 — End: 2018-02-12

## 2017-08-09 MED ORDER — POLYETHYLENE GLYCOL 3350 17 G PO PACK: 17.0000 g | PACK | Freq: Every day | ORAL | 0 refills | Status: AC

## 2017-08-09 MED ORDER — SENNA 8.6 MG PO TABS: 1.0000 | ORAL_TABLET | Freq: Every day | ORAL | 1 refills | Status: DC

## 2017-08-09 NOTE — Patient Instructions (Addendum)
Mr. Fretwell, I have added 3 medications as we discussed. You can pick these up over the counter at the pharmacy. They are nexium (esomeprazole) for your acid reflux/burping/hiccups. The other 2 are miralax and senna. These medications are for constipation. Please let me know if you have any questions or call the clinic at [phone], option 3, and leave a message. Thank you for allowing me for participate in your care. Beckey Rutter, NP

## 2017-08-09 NOTE — Progress Notes (Signed)
[place]  Telephone:([phone] [phone]  Patient Care Team: Idelle Crouch, MD as PCP - General (Internal Medicine) Telford Nab, RN as Registered Nurse Fath, Javier Docker, MD as Consulting Physician (Cardiology) Cammie Sickle, MD as Medical Oncologist (Medical Oncology)   Name of the patient: Phillip Sanchez  [id number]  07/28/1927   Date of visit: 08/09/17  Diagnosis-small cell lung cancer  Chief complaint/Reason for visit- Initial Meeting for [facility], preparing for starting chemotherapy  Heme/Onc history:  Primary Oncologist: Dr. Rogue Bussing   Oncology History   # 2015- LLL [place] ca stage I s/p SBRT [Dr.Crystal]  # 3rd June 2019- Multiple liver lesions  # Prostate cancer [ 23 years ago]; s/p surgery  # colon cancer [s/p resection of malignant polyps; Dr/Elliot; No colectomy]  # hx CAD [asprin/plavix]; hx PVD   Patient initially presented to ER for worsening right upper quadrant abdominal pain that started May 2019, intermittent constipation, fatigue, bloating, and poor appetite.  07/18/2017-CT Abdomen Pelvis W Contrast IMPRESSION: 1. Hepatic and porta hepatis nodal metastasis. This is most likely from the patient's primary lung cancer or less likely prostate cancer. An unknown primary could look similar. 2. New small bilateral pleural effusions. 3. Coronary artery atherosclerosis. Aortic Atherosclerosis (ICD10-I70.0).  07/20/17- CT Chest W Contrast IMPRESSION: 1. Interval development of innumerable low-attenuation lesions throughout the liver compatible with metastatic disease. 2. Interval development of mediastinal adenopathy most compatible with metastatic disease. 3. Interval development of moderate bilateral pleural effusions. While no definite enhancing pleural nodularity is identified, the possibility of metastatic effusions is not excluded. 4. Patchy sclerotic lesion within the T7  vertebral body raising the possibility of osseous metastatic disease. 5. Similar-appearing bandlike consolidation left lower hemithorax in nodularity within the left upper lung. 6.  Aortic Atherosclerosis (ICD10-I70.0).  07/26/17- ER for Abdominal Pain and Shortness of Breath. Hospitalized 07/26/17-08/05/17.  07/26/17 CT Abdomen Pelvis W Contrast IMPRESSION: 1. Widespread hepatic metastatic disease with multiple lesions throughout all lobes in segments. There is also porta hepatis region adenopathy with the largest lymph node in this area slightly larger compared to recent prior study. Neoplastic involvement in this area is felt to be present. No omental lesions evident. 2. idespread left-sided colonic diverticulosis without diverticulitis. No obvious mass related to bowel evident. No bowel obstruction or inflammatory change. Appendix appears normal. 3.  Prostate absent. 4. Sizable pleural effusions bilaterally with bibasilar atelectasis. Effusions appear larger than on recent study. There is a fairly small pericardial effusion as well. 5. Extensive aortoiliac atherosclerosis. Calcification noted in major mesenteric arterial vessels without frank obstruction. There are foci of coronary artery calcification. 6. Scrotal wall edema in the dependent portion of the scrotum. Question small right scrotal hydrocele. Aortic Atherosclerosis (ICD10-I70.0).  Met with Palliative Care NP while inpatient. Discussed goals of care. He was accepting of outpatient palliative care referral. They expressed waiting to discuss Castor until biopsy resulted.   07/29/17 US Biopsy Liver Pathology: DIAGNOSIS:  A. LIVER; ULTRASOUND-GUIDED BIOPSY:  - METASTATIC SMALL CELL CARCINOMA CONSISTENT WITH LUNG ORIGIN.      [name]    Small cell lung cancer ([place])   08/04/2017 Initial Diagnosis    Small cell lung cancer ([place])      08/04/2017 -  Chemotherapy    The patient had palonosetron  (ALOXI) injection 0.25 mg, 0.25 mg, Intravenous,  Once, 0 of 4 cycles pegfilgrastim-cbqv (UDENYCA) injection 6 mg, 6  mg, Subcutaneous, Once, 0 of 4 cycles CARBOplatin (PARAPLATIN) in sodium chloride 0.9 % 100 mL chemo infusion, , Intravenous,  Once, 0 of 4 cycles etoposide (VEPESID) 210 mg in sodium chloride 0.9 % 600 mL chemo infusion, 100 mg/m2, Intravenous,  Once, 0 of 4 cycles fosaprepitant (EMEND) 150 mg, dexamethasone (DECADRON) 12 mg in sodium chloride 0.9 % 145 mL IVPB, , Intravenous,  Once, 0 of 4 cycles atezolizumab (TECENTRIQ) 1,200 mg in sodium chloride 0.9 % 250 mL chemo infusion, 1,200 mg, Intravenous, Once, 0 of 4 cycles  for chemotherapy treatment.        Interval history-patient presents to chemo care clinic for initial meeting in preparation for upcoming  chemotherapy which he plans to start tomorrow, 08/10/2017.  He has newly diagnosed stage IV metastatic small cell lung cancer.  Today, he is accompanied by his son.  They explained that his level of comfort is complicated by constipation, abdominal pain, fatigue, poor appetite, and swelling of feet and legs.  His son states that he has recently started on Colace a stool softener which has not improved her symptoms.  He estimates that he has 1-2 bowel movements per week and symptoms appear to be gradually worsening.  He has difficulty swallowing and poor appetite due to burping and hiccups.  He describes burning" tightness in his esophagus with swallowing.  Has been taking Zantac at night without improvement in symptoms. Son states that his functional status has significantly declined in the past several weeks since being diagnosed and that he has been the primary caregiver for his wife who has dementia.  Now son describes father is mostly chair/bed bound, sleeping large portions of the day.  Son explains that he understands that chemotherapy will not cure disease and that they understand that he has been declining rapidly but they want  to make him more comfortable.  Patient has PCP, Dr. Doy Hutching, who he sees regularly.  Patient denies significant pain and states pain is well controlled with oxycodone.   ECOG FS:3 - Symptomatic, >50% confined to bed  Review of systems- Review of Systems  Constitutional: Positive for malaise/fatigue and weight loss. Negative for chills and fever.  HENT: Positive for hearing loss. Negative for congestion, ear discharge, ear pain, sinus pain, sore throat and tinnitus.   Eyes: Negative.   Respiratory: Positive for shortness of breath (with exertion; stable at rest). Negative for cough, hemoptysis and sputum production.   Cardiovascular: Positive for orthopnea, claudication and leg swelling. Negative for chest pain and palpitations.  Gastrointestinal: Positive for abdominal pain, constipation, heartburn and nausea. Negative for blood in stool, diarrhea, melena and vomiting.  Genitourinary: Negative.   Musculoskeletal: Positive for joint pain.  Skin: Negative.   Neurological: Positive for weakness. Negative for dizziness, tingling and headaches.  Endo/Heme/Allergies: Negative.   Psychiatric/Behavioral: Negative.     Current treatment-anticipate starting palliative chemotherapy with Carbo-Etoposide-Tecentriq on 08/10/17  No Known Allergies  Past Medical History:  Diagnosis Date  . Anemia   . CHF (congestive heart failure) ([place])   . Colon cancer [facility])    He states they removed cancerous polyps.  . Hypercholesteremia   . Hypertension   . Lung cancer ([place])   . Myocardial infarction ([place])   . Osteoarthritis   . Prostate cancer [facility])    Prostatectomy.  . Small cell lung cancer ([place]) 08/04/2017    Past Surgical History:  Procedure Laterality Date  . COLON SURGERY    . CORONARY ANGIOPLASTY WITH STENT PLACEMENT    .  herniorrhaphy    . PORTA CATH INSERTION N/A 08/05/2017   Procedure: PORTA CATH INSERTION;  Surgeon: Katha Cabal, MD;  Location: [place] [facility];  Service:  Cardiovascular;  Laterality: N/A;    Social History   Socioeconomic History  . Marital status: Married    Spouse name: Not on file  . Number of children: Not on file  . Years of education: Not on file  . Highest education level: Not on file  Occupational History  . Not on file  Social Needs  . Financial resource strain: Not on file  . Food insecurity:    Worry: Not on file    Inability: Not on file  . Transportation needs:    Medical: Not on file    Non-medical: Not on file  Tobacco Use  . Smoking status: Former Smoker    Packs/day: 1.00    Years: 36.00    Pack years: 36.00    Types: Cigarettes    Last attempt to quit: 1987    Years since quitting: 32.5  . Smokeless tobacco: Never Used  Substance and Sexual Activity  . Alcohol use: No    Alcohol/week: 0.0 oz  . Drug use: No  . Sexual activity: Not Currently  Lifestyle  . Physical activity:    Days per week: Not on file    Minutes per session: Not on file  . Stress: Not on file  Relationships  . Social connections:    Talks on phone: Not on file    Gets together: Not on file    Attends religious service: Not on file    Active member of club or organization: Not on file    Attends meetings of clubs or organizations: Not on file    Relationship status: Not on file  . Intimate partner violence:    Fear of current or ex partner: Not on file    Emotionally abused: Not on file    Physically abused: Not on file    Forced sexual activity: Not on file  Other Topics Concern  . Not on file  Social History Narrative  . Not on file    Family History  Problem Relation Age of Onset  . Cancer Brother 12       unknown orgin  . Cancer Sister 38       unknown type    Current Outpatient Medications:  .  amLODipine-benazepril (LOTREL) 10-40 MG capsule, Take 1 capsule by mouth daily., Disp: , Rfl:  .  aspirin EC 81 MG tablet, Take 81 mg by mouth daily. , Disp: , Rfl:  .  atorvastatin (LIPITOR) 20 MG tablet, Take 20 mg  by mouth at bedtime. , Disp: , Rfl:  .  carvedilol (COREG) 25 MG tablet, Take 25 mg by mouth 2 (two) times daily. , Disp: , Rfl:  .  clopidogrel (PLAVIX) 75 MG tablet, Take 75 mg by mouth daily. , Disp: , Rfl:  .  docusate sodium (COLACE) 100 MG capsule, Take 1 capsule (100 mg total) by mouth daily as needed. (Patient taking differently: Take 100 mg by mouth daily as needed for mild constipation. ), Disp: 30 capsule, Rfl: 2 .  dronabinol (MARINOL) 5 MG capsule, Take 1 capsule (5 mg total) by mouth 2 (two) times daily before a meal., Disp: 30 capsule, Rfl: 0 .  fexofenadine-pseudoephedrine (ALLEGRA-D 24) 180-240 MG per 24 hr tablet, Take 1 tablet by mouth daily as needed (for allergies). , Disp: , Rfl:  .  fluticasone (FLONASE) 50 MCG/ACT nasal spray, Place 1 spray into the nose daily as needed for allergies or rhinitis. , Disp: , Rfl:  .  furosemide (LASIX) 20 MG tablet, Take 1 tablet (20 mg total) by mouth daily., Disp: 30 tablet, Rfl: 0 .  levocetirizine (XYZAL) 5 MG tablet, Take 5 mg by mouth daily as needed for allergies. , Disp: , Rfl:  .  Multiple Vitamin (MULTI-VITAMINS) TABS, Take 1 tablet by mouth daily. , Disp: , Rfl:  .  Oxycodone HCl 10 MG TABS, Take 1 tablet (10 mg total) by mouth every 6 (six) hours as needed., Disp: 30 tablet, Rfl: 0 .  potassium chloride (K-DUR) 10 MEQ tablet, Take 1 tablet (10 mEq total) by mouth daily., Disp: 30 tablet, Rfl: 0 .  ranitidine (ZANTAC) 300 MG tablet, Take 300 mg by mouth at bedtime., Disp: , Rfl:  .  rOPINIRole (REQUIP) 1 MG tablet, Take 1 mg by mouth at bedtime. , Disp: , Rfl:  .  temazepam (RESTORIL) 15 MG capsule, Take 15-30 mg by mouth at bedtime as needed for sleep. , Disp: , Rfl:  .  esomeprazole (NEXIUM) 20 MG capsule, Take 1 capsule (20 mg total) by mouth daily., Disp: 90 capsule, Rfl: 1 .  lidocaine-prilocaine (EMLA) cream, Apply to affected area once (Patient not taking: Reported on 08/09/2017), Disp: 30 g, Rfl: 3 .  ondansetron (ZOFRAN) 8 MG  tablet, Take 1 tablet (8 mg total) by mouth 2 (two) times daily as needed for refractory nausea / vomiting. Start on day 3 after carboplatin chemo. (Patient not taking: Reported on 08/09/2017), Disp: 30 tablet, Rfl: 1 .  polyethylene glycol (MIRALAX / GLYCOLAX) packet, Take 17 g by mouth daily., Disp: 30 each, Rfl: 0 .  senna (SENOKOT) 8.6 MG TABS tablet, Take 1 tablet (8.6 mg total) by mouth daily., Disp: 120 each, Rfl: 1  Physical exam:  Vitals:   08/09/17 1202  BP: 127/68  Pulse: 64  Resp: 16  Temp: 97.9 F (36.6 C)  TempSrc: Tympanic  SpO2: 94%   Physical Exam  Constitutional: He is oriented to person, place, and time. He appears well-developed and well-nourished. No distress.  Frail, elderly, chronically ill appearing gentleman; In wheelchair in exam room; Accompanied by son  HENT:  Head: Normocephalic and atraumatic.  Nose: Nose normal.  Mouth/Throat: No oropharyngeal exudate.  Hearing aid  Eyes: Conjunctivae are normal. No scleral icterus.  Neck: Normal range of motion. Neck supple.  Cardiovascular: Normal rate, regular rhythm and normal heart sounds.  Pulmonary/Chest: Effort normal. No respiratory distress. He has no wheezes.  Abdominal: Soft. Bowel sounds are normal. He exhibits no distension (rounded). There is tenderness.  Musculoskeletal: Normal range of motion. He exhibits edema (2+ edema BLE, pedal; generalized edema bilateral hands).  Neurological: He is alert and oriented to person, place, and time.  Skin: Skin is warm and dry. There is pallor.  Psychiatric: He has a normal mood and affect.  Significantly fatigued appearing.  Minimally interactive.     CMP Latest Ref Rng & Units 07/30/2017  Glucose 65 - 99 mg/dL 138(H)  BUN 6 - 20 mg/dL 46(H)  Creatinine 0.61 - 1.24 mg/dL 1.14  Sodium 135 - 145 mmol/L 138  Potassium 3.5 - 5.1 mmol/L 3.9  Chloride 101 - 111 mmol/L 104  CO2 22 - 32 mmol/L 25  Calcium 8.9 - 10.3 mg/dL 7.9(L)  Total Protein 6.5 - 8.1 g/dL -    Total Bilirubin 0.3 - 1.2 mg/dL -  Alkaline Phos  38 - 126 U/L -  AST 15 - 41 U/L -  ALT 17 - 63 U/L -   CBC Latest Ref Rng & Units 07/30/2017  WBC 3.8 - 10.6 K/uL 9.8  Hemoglobin 13.0 - 18.0 g/dL 12.0(L)  Hematocrit 40.0 - 52.0 % 35.3(L)  Platelets 150 - 440 K/uL 194    No images are attached to the encounter.  Dg Chest 2 View  Result Date: 07/26/2017 CLINICAL DATA:  Shortness of breath EXAM: CHEST - 2 VIEW COMPARISON:  Chest CT 07/20/2017 FINDINGS: Small bilateral pleural effusions. Heart is borderline in size. Mild vascular congestion and interstitial prominence within the lungs which could reflect interstitial edema. More confluent left perihilar opacity as seen on prior chest CT. IMPRESSION: Mild vascular congestion and interstitial prominence, possibly interstitial edema. More confluent left perihilar opacity concerning for pneumonia. Small bilateral effusions. Electronically Signed   By: Rolm Baptise M.D.   On: 07/26/2017 11:25   Ct Chest W Contrast  Result Date: 07/21/2017 CLINICAL DATA:  History of lung, prostate and colon cancer. Follow-up exam. EXAM: CT CHEST WITH CONTRAST TECHNIQUE: Multidetector CT imaging of the chest was performed during intravenous contrast administration. CONTRAST:  76m ISOVUE-300 IOPAMIDOL (ISOVUE-300) INJECTION 61% COMPARISON:  Chest CT 01/14/2017 FINDINGS: Cardiovascular: Normal heart size. Coronary arterial and thoracic aortic vascular calcifications. Interval development of small pericardial effusion. Mediastinum/Nodes: Interval development of a 6 mm right paratracheal lymph node (image 47; series 2), previously 2 mm, a 9 mm precarinal lymph node (image 66; series 2), previously 6 mm and a 7 mm subcarinal lymph node (image 81; series 2), previously 4 mm. Esophagus is normal in appearance. There is a new 22 mm left paratracheal lymph node (image 60; series 2), previously 6 mm. Interval increase in size of 14 mm left hilar lymph node (image 75; series 2),  previously 10 mm. Lungs/Pleura: Central airways are patent. Interval development of moderate layering bilateral pleural effusions. Re demonstrated bandlike consolidation left lower lobe (image 85; series 3). Similar 10 x 9 mm irregular left upper lobe nodule (image 36; series 3). Re-demonstrated bilateral predominately peripheral reticular opacities. No pneumothorax. Upper Abdomen: Interval development of innumerable low-attenuation lesions throughout the liver. Reference lesion within the right hepatic lobe measures 5.8 x 5.2 cm (image 184; series 2). Reference lesion within the left hepatic lobe measures 4.2 x 4.6 cm (image 145; series 2). Musculoskeletal: Patchy sclerotic lesion demonstrated within the T7 vertebral body. Thoracic spine degenerative changes. IMPRESSION: 1. Interval development of innumerable low-attenuation lesions throughout the liver compatible with metastatic disease. 2. Interval development of mediastinal adenopathy most compatible with metastatic disease. 3. Interval development of moderate bilateral pleural effusions. While no definite enhancing pleural nodularity is identified, the possibility of metastatic effusions is not excluded. 4. Patchy sclerotic lesion within the T7 vertebral body raising the possibility of osseous metastatic disease. 5. Similar-appearing bandlike consolidation left lower hemithorax in nodularity within the left upper lung. 6.  Aortic Atherosclerosis (ICD10-I70.0). Electronically Signed   By: DLovey NewcomerM.D.   On: 07/21/2017 09:48   Ct Abdomen Pelvis W Contrast  Result Date: 07/26/2017 CLINICAL DATA:  Abdominal pain and distension. Shortness of breath. Reported history of lung, colon, and prostate carcinoma EXAM: CT ABDOMEN AND PELVIS WITH CONTRAST TECHNIQUE: Multidetector CT imaging of the abdomen and pelvis was performed using the standard protocol following bolus administration of intravenous contrast. CONTRAST:  1073mOMNIPAQUE IOHEXOL 300 MG/ML  SOLN  COMPARISON:  July 18, 2017 FINDINGS: Lower chest: There are moderate pleural effusions  bilaterally with bibasilar atelectatic change. There is a fairly small pericardial effusion. There are foci of coronary artery calcification. Hepatobiliary: There is widespread hepatic metastatic disease with lesions seen throughout all lobes in segments of the liver. Lesions range in size from as small as 8 mm to as large as 5.9 x 5.2 cm cm. This largest lesion is seen in the posterior segment right lobe. There is a 5.6 x 5.4 cm lesion in the left lobe of the liver near the junction with the right lobe. There is no appreciable biliary duct dilatation. Gallbladder is contracted. Pancreas: No pancreatic mass or inflammatory focus evident. Spleen: No splenic lesions are evident. Adrenals/Urinary Tract: Adrenals bilaterally appear unremarkable. There are is a cyst in the mid right kidney measuring 0.9 x 0.6 cm. There is a cyst in the posterior mid left kidney measuring 1.0 x 0.7 cm. There are scattered tiny probable cysts elsewhere in the kidneys. No hydronephrosis evident on either side. There is no renal or ureteral calculus on either side. Urinary bladder is midline with wall thickness within normal limits. Stomach/Bowel: There are multiple sigmoid and descending colonic diverticula without diverticulitis. There is no appreciable bowel wall or mesenteric thickening. No evident bowel obstruction. No free air or portal venous air. Vascular/Lymphatic: There is atherosclerotic calcification throughout the aorta and iliac arteries. There is calcification in major mesenteric arterial vessels proximally without frank obstruction. There is no appreciable abdominal aortic aneurysm. There is adenopathy in the porta hepatis region, slightly larger compared to recent study. Largest lymph node in the porta hepatis region measures 2.9 x 2.0 cm. There is no other adenopathy seen elsewhere. Reproductive: Prostate is absent. No pelvic mass evident.  There is scrotal wall thickening with questionable small hydrocele on the right. Other: Appendix appears normal. No abscess or ascites is evident in the abdomen or pelvis. No omental lesions evident. Musculoskeletal: There are multiple foci of degenerative change in the lower thoracic and lumbar regions. Sclerosis in the L2 vertebral body is stable, likely due to arthropathy. Bony metastatic disease not felt to be likely in this area. There is no lytic or destructive bone lesion. No sclerotic appearing metastasis. No intramuscular lesions evident. IMPRESSION: 1. Widespread hepatic metastatic disease with multiple lesions throughout all lobes in segments. There is also porta hepatis region adenopathy with the largest lymph node in this area slightly larger compared to recent prior study. Neoplastic involvement in this area is felt to be present. No omental lesions evident. 2. Widespread left-sided colonic diverticulosis without diverticulitis. No obvious mass related to bowel evident. No bowel obstruction or inflammatory change. Appendix appears normal. 3.  Prostate absent. 4. Sizable pleural effusions bilaterally with bibasilar atelectasis. Effusions appear larger than on recent study. There is a fairly small pericardial effusion as well. 5. Extensive aortoiliac atherosclerosis. Calcification noted in major mesenteric arterial vessels without frank obstruction. There are foci of coronary artery calcification. 6. Scrotal wall edema in the dependent portion of the scrotum. Question small right scrotal hydrocele. Aortic Atherosclerosis (ICD10-I70.0). Electronically Signed   By: Lowella Grip III M.D.   On: 07/26/2017 11:52   Ct Abdomen Pelvis W Contrast  Result Date: 07/18/2017 CLINICAL DATA:  Prostate cancer. Diffuse abdominal pain for 5 weeks. Constipation. History of lung cancer. EXAM: CT ABDOMEN AND PELVIS WITH CONTRAST TECHNIQUE: Multidetector CT imaging of the abdomen and pelvis was performed using the  standard protocol following bolus administration of intravenous contrast. CONTRAST:  191m ISOVUE-300 IOPAMIDOL (ISOVUE-300) INJECTION 61% COMPARISON:  01/23/2014 01/14/2017 chest CT.  PET. No prior dedicated abdominopelvic CT. FINDINGS: Lower chest: Emphysema. Bibasilar atelectasis. Normal heart size with right coronary artery atherosclerosis. Small bilateral pleural effusions are new. Hepatobiliary: Since 01/14/2017, development of widespread hepatic metastasis. Index right hepatic lobe lesion measures 5.4 cm on image 33/2. Index lateral segment left liver lobe lesion measures 4.9 cm on image 15/2. Normal gallbladder, without biliary ductal dilatation. Pancreas: Normal, without mass or ductal dilatation. Spleen: Normal in size, without focal abnormality. Adrenals/Urinary Tract: Normal adrenal glands. Too small to characterize lesions in both kidneys. Mild renal cortical thinning is within normal variation for age. No hydronephrosis. Stomach/Bowel: Normal stomach, without wall thickening. Extensive colonic diverticulosis. Normal terminal ileum and appendix. Normal small bowel. Vascular/Lymphatic: Advanced aortic and branch vessel atherosclerosis. Porta hepatis adenopathy is new and suspicious. Example gastrohepatic ligament node of 1.7 cm on image 26/2. No pelvic sidewall adenopathy. Reproductive: Prostatectomy, without locally recurrent disease. Other: No significant free fluid. No evidence of omental or peritoneal disease. Musculoskeletal: Lumbosacral spondylosis. IMPRESSION: 1. Hepatic and porta hepatis nodal metastasis. This is most likely from the patient's primary lung cancer or less likely prostate cancer. An unknown primary could look similar. 2. New small bilateral pleural effusions. 3. Coronary artery atherosclerosis. Aortic Atherosclerosis (ICD10-I70.0). Electronically Signed   By: Abigail Miyamoto M.D.   On: 07/18/2017 18:31   US Biopsy (liver)  Result Date: 07/29/2017 INDICATION: 84 year old with  history of colon, lung and prostate cancer. Patient has innumerable liver lesions and needs a tissue diagnosis. EXAM: ULTRASOUND-GUIDED LIVER LESION BIOPSY MEDICATIONS: None. ANESTHESIA/SEDATION: Moderate (conscious) sedation was employed during this procedure. A total of Versed 1.5 mg and Fentanyl 25 mcg was administered intravenously. Moderate Sedation Time: 17 minutes. The patient's level of consciousness and vital signs were monitored continuously by radiology nursing throughout the procedure under my direct supervision. FLUOROSCOPY TIME:  None COMPLICATIONS: None immediate. PROCEDURE: Informed written consent was obtained from the patient after a thorough discussion of the procedural risks, benefits and alternatives. All questions were addressed. A timeout was performed prior to the initiation of the procedure. Liver was evaluated with ultrasound. Lesion in the left hepatic lobe was selected for biopsy. Anterior abdomen was prepped with chlorhexidine and a sterile field was created. Skin and soft tissues were anesthetized with 1% lidocaine. 72 gauge coaxial needle directed into the left hepatic lobe with ultrasound guidance. Needle was positioned within the lesion. Three core biopsies obtained with an 18 gauge core device. Gel-Foam slurry was injected through the 17 gauge needle as it was removed. Core specimens placed in formalin. Bandage placed over the puncture site. FINDINGS: Innumerable lesions scattered throughout the liver. Lesion in the anterior and inferior left hepatic lobe was targeted. Needle position was confirmed within the lesion. No significant bleeding or hematoma formation following the core biopsies. IMPRESSION: Ultrasound-guided core biopsy of a left hepatic lesion. Electronically Signed   By: Markus Daft M.D.   On: 07/29/2017 12:01   Dg Chest Port 1 View  Result Date: 07/28/2017 CLINICAL DATA:  Respiratory failure EXAM: PORTABLE CHEST 1 VIEW COMPARISON:  07/26/2017 FINDINGS: Worsening  bilateral airspace disease most compatible with pulmonary edema. Cardiac enlargement. Mild progression of bibasilar atelectasis left greater than right. Minimal pleural effusion. IMPRESSION: Progression of bilateral airspace disease consistent with worsening heart failure and edema. Electronically Signed   By: Franchot Gallo M.D.   On: 07/28/2017 07:01     Assessment and plan- Patient is a 84 y.o. male who presents to [facility] for initial meeting in preparation for  starting palliative chemotherapy on 08/10/17.   1.  Small cell lung cancer- prior hx of LLL adeno carcinoma stg 1 s/p SBRT with Dr. Baruch Gouty in 2015; Presented to ER for symptoms and found to have multiple liver lesions June 2019. Liver biopsy revealed small cell lung. Overall prognosis is poor given declining performance status, however we discussed that treatments often have good response rates but short lives. Patient and his son have discussed this with Dr. Tasia Catchings (covering physician for Dr. Rogue Bussing who is primary medical oncologist). Patient opted for chemotherapy given with palliative intent. Plans to initiate Carbo-Etoposide-Tecentriq on 08/10/17. He has not yet had a brain MRI but will defer to Dr. Rogue Bussing.   2.  Constipation-chronic constipation, gradually worsening and uncontrolled by Colace.  Likely exacerbated by poor p.o. intake, low fiber diet, and lack of exercise.  Opioid use may also be contributory. Discussed adding MiraLAX and senna.  Given upcoming chemotherapy will likely have to adjust or hold medications if diarrhea/worsening constipation.  Continue to monitor by primary team.  3.  Acid reflux/GERD- symptoms not currently well managed. Discussed adding PPI. In setting of Plavix, would recommend Nexium or Protonix. Continue to monitor.   4. Goals of care-patient met with palliative care nurse practitioner during recent hospitalization.  At that time he expressed his desire for DNR but wished to proceed with  palliative chemotherapy. ACP was deferred at that time as family wished to wait for biopsy results.  Patient was accepting of outpatient palliative care referral. I do not see where that referral has been made. Will follow up with primary med-onc team and can send referral if needed.   5. Barriers to Care- Patient has insurance through [employer]. He has a PCP whom he sees regularly. He denies problems obtaining or affording medications. Son is primarily source for transportation and is also now primary caregiver for patient and mother who has dementia.  Denies needing assistance at this time but patient may benefit from home care services in the near future. I discussed options for accessing care when it is needed including the [facility]. Advised patient of how to contact Clinic and Provider should he need care. Patient will also meet [facility], RN (Lung Cancer Nurse Navigator) tomorrow to help navigate care.    Visit Diagnosis 1. Cancer of lower lobe of left lung ([place])   2. Small cell lung cancer ([place])   3. Constipation, unspecified constipation type   4. Gastroesophageal reflux disease, esophagitis presence not specified   5. Goals of care, counseling/discussion     Patient expressed understanding and was in agreement with this plan. He also understands that He can call clinic at any time with any questions, concerns, or complaints.    Beckey Rutter, DNP, AGNP-C [place] at [facility] - B [phone] (work cell) [phone] (office)

## 2017-08-10 ENCOUNTER — Encounter: Payer: Self-pay | Admitting: Internal Medicine

## 2017-08-10 ENCOUNTER — Inpatient Hospital Stay: Payer: Medicare Other

## 2017-08-10 ENCOUNTER — Inpatient Hospital Stay (HOSPITAL_BASED_OUTPATIENT_CLINIC_OR_DEPARTMENT_OTHER): Payer: Medicare Other | Admitting: Internal Medicine

## 2017-08-10 ENCOUNTER — Encounter: Payer: Self-pay | Admitting: *Deleted

## 2017-08-10 ENCOUNTER — Other Ambulatory Visit: Payer: Self-pay

## 2017-08-10 VITALS — BP 108/51 | HR 72 | Temp 98.4°F | Resp 22 | Ht 70.0 in | Wt 189.0 lb

## 2017-08-10 DIAGNOSIS — R630 Anorexia: Secondary | ICD-10-CM

## 2017-08-10 DIAGNOSIS — I10 Essential (primary) hypertension: Secondary | ICD-10-CM | POA: Diagnosis not present

## 2017-08-10 DIAGNOSIS — C3432 Malignant neoplasm of lower lobe, left bronchus or lung: Secondary | ICD-10-CM | POA: Diagnosis not present

## 2017-08-10 DIAGNOSIS — R109 Unspecified abdominal pain: Secondary | ICD-10-CM

## 2017-08-10 DIAGNOSIS — Z87891 Personal history of nicotine dependence: Secondary | ICD-10-CM

## 2017-08-10 DIAGNOSIS — Z85038 Personal history of other malignant neoplasm of large intestine: Secondary | ICD-10-CM | POA: Diagnosis not present

## 2017-08-10 DIAGNOSIS — R609 Edema, unspecified: Secondary | ICD-10-CM

## 2017-08-10 DIAGNOSIS — R600 Localized edema: Secondary | ICD-10-CM

## 2017-08-10 DIAGNOSIS — C787 Secondary malignant neoplasm of liver and intrahepatic bile duct: Secondary | ICD-10-CM

## 2017-08-10 DIAGNOSIS — Z8546 Personal history of malignant neoplasm of prostate: Secondary | ICD-10-CM

## 2017-08-10 DIAGNOSIS — R634 Abnormal weight loss: Secondary | ICD-10-CM | POA: Diagnosis not present

## 2017-08-10 DIAGNOSIS — C3490 Malignant neoplasm of unspecified part of unspecified bronchus or lung: Secondary | ICD-10-CM

## 2017-08-10 DIAGNOSIS — Z5111 Encounter for antineoplastic chemotherapy: Secondary | ICD-10-CM | POA: Diagnosis not present

## 2017-08-10 LAB — CBC WITH DIFFERENTIAL/PLATELET
BASOS ABS: 0.1 10*3/uL (ref 0–0.1)
BASOS PCT: 1 %
EOS ABS: 1.7 10*3/uL — AB (ref 0–0.7)
Eosinophils Relative: 14 %
HCT: 31.9 % — ABNORMAL LOW (ref 40.0–52.0)
Hemoglobin: 10.8 g/dL — ABNORMAL LOW (ref 13.0–18.0)
Lymphocytes Relative: 5 %
Lymphs Abs: 0.7 10*3/uL — ABNORMAL LOW (ref 1.0–3.6)
MCH: 34.2 pg — ABNORMAL HIGH (ref 26.0–34.0)
MCHC: 33.8 g/dL (ref 32.0–36.0)
MCV: 101.1 fL — ABNORMAL HIGH (ref 80.0–100.0)
MONO ABS: 0.9 10*3/uL (ref 0.2–1.0)
Monocytes Relative: 7 %
Neutro Abs: 9.3 10*3/uL — ABNORMAL HIGH (ref 1.4–6.5)
Neutrophils Relative %: 73 %
Platelets: 235 10*3/uL (ref 150–440)
RBC: 3.16 MIL/uL — ABNORMAL LOW (ref 4.40–5.90)
RDW: 14.1 % (ref 11.5–14.5)
WBC: 12.5 10*3/uL — ABNORMAL HIGH (ref 3.8–10.6)

## 2017-08-10 LAB — COMPREHENSIVE METABOLIC PANEL
ALBUMIN: 2.9 g/dL — AB (ref 3.5–5.0)
ALT: 48 U/L — ABNORMAL HIGH (ref 0–44)
AST: 94 U/L — AB (ref 15–41)
Alkaline Phosphatase: 120 U/L (ref 38–126)
Anion gap: 9 (ref 5–15)
BUN: 30 mg/dL — ABNORMAL HIGH (ref 8–23)
CHLORIDE: 99 mmol/L (ref 98–111)
CO2: 26 mmol/L (ref 22–32)
Calcium: 8.5 mg/dL — ABNORMAL LOW (ref 8.9–10.3)
Creatinine, Ser: 1.42 mg/dL — ABNORMAL HIGH (ref 0.61–1.24)
GFR, EST AFRICAN AMERICAN: 49 mL/min — AB (ref >60)
GFR, EST NON AFRICAN AMERICAN: 43 mL/min — AB (ref >60)
Glucose, Bld: 191 mg/dL — ABNORMAL HIGH (ref 70–99)
POTASSIUM: 4.3 mmol/L (ref 3.5–5.1)
SODIUM: 134 mmol/L — AB (ref 135–145)
Total Bilirubin: 0.9 mg/dL (ref 0.3–1.2)
Total Protein: 5.9 g/dL — ABNORMAL LOW (ref 6.5–8.1)

## 2017-08-10 LAB — URINALYSIS, COMPLETE (UACMP) WITH MICROSCOPIC
Bilirubin Urine: NEGATIVE
GLUCOSE, UA: NEGATIVE mg/dL
HGB URINE DIPSTICK: NEGATIVE
Ketones, ur: NEGATIVE mg/dL
LEUKOCYTES UA: NEGATIVE
NITRITE: NEGATIVE
Protein, ur: NEGATIVE mg/dL
SPECIFIC GRAVITY, URINE: 1.016 (ref 1.005–1.030)
pH: 5 (ref 5.0–8.0)

## 2017-08-10 LAB — PROTIME-INR
INR: 1.2
Prothrombin Time: 15.1 s (ref 11.4–15.2)

## 2017-08-10 LAB — TSH: TSH: 1.083 u[IU]/mL (ref 0.350–4.500)

## 2017-08-10 MED ORDER — SODIUM CHLORIDE 0.9 % IV SOLN
200.0000 mg | Freq: Once | INTRAVENOUS | Status: AC
  Administered 2017-08-10: 200 mg via INTRAVENOUS
  Filled 2017-08-10: qty 10

## 2017-08-10 MED ORDER — SODIUM CHLORIDE 0.9 % IV SOLN
1200.0000 mg | Freq: Once | INTRAVENOUS | Status: AC
  Administered 2017-08-10: 1200 mg via INTRAVENOUS
  Filled 2017-08-10: qty 20

## 2017-08-10 MED ORDER — SODIUM CHLORIDE 0.9 % IV SOLN
340.5000 mg | Freq: Once | INTRAVENOUS | Status: AC
  Administered 2017-08-10: 340 mg via INTRAVENOUS
  Filled 2017-08-10: qty 34

## 2017-08-10 MED ORDER — PROCHLORPERAZINE MALEATE 10 MG PO TABS: 10.0000 mg | ORAL_TABLET | Freq: Four times a day (QID) | ORAL | 1 refills | Status: DC | PRN

## 2017-08-10 MED ORDER — SODIUM CHLORIDE 0.9 % IV SOLN
Freq: Once | INTRAVENOUS | Status: AC
  Administered 2017-08-10: 11:00:00 via INTRAVENOUS
  Filled 2017-08-10: qty 5

## 2017-08-10 MED ORDER — PALONOSETRON HCL INJECTION 0.25 MG/5ML
0.2500 mg | Freq: Once | INTRAVENOUS | Status: AC
  Administered 2017-08-10: 0.25 mg via INTRAVENOUS
  Filled 2017-08-10: qty 5

## 2017-08-10 MED ORDER — HEPARIN SOD (PORK) LOCK FLUSH 100 UNIT/ML IV SOLN
500.0000 [IU] | Freq: Once | INTRAVENOUS | Status: AC | PRN
  Administered 2017-08-10: 500 [IU]
  Filled 2017-08-10: qty 5

## 2017-08-10 MED ORDER — SODIUM CHLORIDE 0.9 % IV SOLN
Freq: Once | INTRAVENOUS | Status: AC
  Administered 2017-08-10: 11:00:00 via INTRAVENOUS
  Filled 2017-08-10: qty 1000

## 2017-08-10 NOTE — Progress Notes (Signed)
[place] OFFICE PROGRESS NOTE  Patient Care Team: Idelle Crouch, MD as PCP - General (Internal Medicine) Telford Nab, RN as Registered Nurse Earlie Server, MD as Medical Oncologist (Medical Oncology) Teodoro Spray, MD as Consulting Physician (Cardiology)  Cancer Staging No matching staging information was found for the patient.   Oncology History   # 2015- LLL [place] ca stage I s/p SBRT [Dr.Crystal]  # 3rd June 2019- Multiple liver lesions- s/p Bx- SMALL CELL of lung origin; STAGE IV;  # June 26th- carbo-etop-atezo  # Prostate cancer [ 3 years ago]; s/p surgery  # colon cancer [s/p resection of malignant polyps; Dr/Elliot; No colectomy]  # hx CAD [asprin/plavix]; hx PVD  -----------------------------------------------   DIAGNOSIS: Small cell lung cancer  STAGE:  IV     ;GOALS: Palliative  CURRENT/MOST RECENT THERAPY: carbo-etop-Atezo      [name]    Small cell lung cancer ([place])   08/04/2017 Initial Diagnosis    Small cell lung cancer ([place])      08/04/2017 -  Chemotherapy    The patient had palonosetron (ALOXI) injection 0.25 mg, 0.25 mg, Intravenous,  Once, 1 of 4 cycles Administration: 0.25 mg (08/10/2017) pegfilgrastim-cbqv (UDENYCA) injection 6 mg, 6 mg, Subcutaneous, Once, 1 of 4 cycles Administration: 6 mg (08/15/2017) CARBOplatin (PARAPLATIN) 340 mg in sodium chloride 0.9 % 250 mL chemo infusion, 340 mg (100 % of original dose 340.5 mg), Intravenous,  Once, 1 of 4 cycles Dose modification:   (original dose 340.5 mg, Cycle 1) Administration: 340 mg (08/10/2017) etoposide (VEPESID) 200 mg in sodium chloride 0.9 % 500 mL chemo infusion, 210 mg, Intravenous,  Once, 1 of 4 cycles Administration: 200 mg (08/10/2017), 200 mg (08/11/2017), 200 mg (08/12/2017) fosaprepitant (EMEND) 150 mg, dexamethasone (DECADRON) 12 mg in sodium chloride 0.9 % 145 mL IVPB, , Intravenous,  Once, 1 of 4 cycles Administration:   (08/10/2017) atezolizumab (TECENTRIQ) 1,200 mg in sodium chloride 0.9 % 250 mL chemo infusion, 1,200 mg, Intravenous, Once, 1 of 4 cycles Administration: 1,200 mg (08/10/2017)  for chemotherapy treatment.          INTERVAL HISTORY:  Phillip Sanchez 84 y.o.  male pleasant patient above history of extensive stage small cell lung cancer newly Diagnosed is here to proceed with cycle #1 of chemotherapy.  Complains of worsening swelling in the legs.  Denies any nausea vomiting.  Complains of poor appetite.  Positive for weight loss.  Denies any headaches.  Review of Systems  Constitutional: Positive for malaise/fatigue. Negative for chills, diaphoresis, fever and weight loss.  HENT: Negative for nosebleeds and sore throat.   Eyes: Negative for double vision.  Respiratory: Negative for cough, hemoptysis, sputum production, shortness of breath and wheezing.   Cardiovascular: Positive for leg swelling. Negative for chest pain, palpitations and orthopnea.  Gastrointestinal: Positive for abdominal pain. Negative for blood in stool, constipation, diarrhea, heartburn, melena, nausea and vomiting.  Genitourinary: Negative for dysuria, frequency and urgency.  Musculoskeletal: Negative for back pain and joint pain.  Skin: Negative.  Negative for itching and rash.  Neurological: Negative for dizziness, tingling, focal weakness, weakness and headaches.  Endo/Heme/Allergies: Does not bruise/bleed easily.  Psychiatric/Behavioral: Negative for depression. The patient is not nervous/anxious and does not have insomnia.       PAST MEDICAL HISTORY :  Past Medical History:  Diagnosis Date  . Anemia   . CHF (congestive heart failure) ([place])   . Colon cancer ([place])  He states they removed cancerous polyps.  . Hypercholesteremia   . Hypertension   . Lung cancer ([place])   . Myocardial infarction ([place])   . Osteoarthritis   . Prostate cancer [facility])    Prostatectomy.  . Small cell lung cancer ([place]) 08/04/2017     PAST SURGICAL HISTORY :   Past Surgical History:  Procedure Laterality Date  . COLON SURGERY    . CORONARY ANGIOPLASTY WITH STENT PLACEMENT    . herniorrhaphy    . PORTA CATH INSERTION N/A 08/05/2017   Procedure: PORTA CATH INSERTION;  Surgeon: Katha Cabal, MD;  Location: [facility] [place] [facility];  Service: Cardiovascular;  Laterality: N/A;    FAMILY HISTORY :   Family History  Problem Relation Age of Onset  . Cancer Brother 56       unknown orgin  . Cancer Sister 79       unknown type    SOCIAL HISTORY:   Social History   Tobacco Use  . Smoking status: Former Smoker    Packs/day: 1.00    Years: 36.00    Pack years: 36.00    Types: Cigarettes    Last attempt to quit: 1987    Years since quitting: 32.5  . Smokeless tobacco: Never Used  Substance Use Topics  . Alcohol use: No    Alcohol/week: 0.0 oz  . Drug use: No    ALLERGIES:  has No Known Allergies.  MEDICATIONS:  Current Outpatient Medications  Medication Sig Dispense Refill  . amLODipine-benazepril (LOTREL) 10-40 MG capsule Take 1 capsule by mouth daily.    Marland Kitchen aspirin EC 81 MG tablet Take 81 mg by mouth daily.     Marland Kitchen atorvastatin (LIPITOR) 20 MG tablet Take 20 mg by mouth at bedtime.     . calcium carbonate (OS-CAL - DOSED IN MG OF ELEMENTAL CALCIUM) 1250 (500 Ca) MG tablet Take 1 tablet by mouth daily.    . carvedilol (COREG) 25 MG tablet Take 25 mg by mouth 2 (two) times daily.     . cetirizine (ZYRTEC) 10 MG tablet Take 1 tablet by mouth daily.    . clopidogrel (PLAVIX) 75 MG tablet Take 75 mg by mouth daily.     Marland Kitchen docusate sodium (COLACE) 100 MG capsule Take 1 capsule (100 mg total) by mouth daily as needed. (Patient taking differently: Take 100 mg by mouth daily as needed for mild constipation. ) 30 capsule 2  . dronabinol (MARINOL) 5 MG capsule Take 1 capsule (5 mg total) by mouth 2 (two) times daily before a meal. 30 capsule 0  . esomeprazole (NEXIUM) 20 MG capsule Take 1 capsule (20 mg total) by  mouth daily. 90 capsule 1  . fexofenadine-pseudoephedrine (ALLEGRA-D 24) 180-240 MG per 24 hr tablet Take 1 tablet by mouth daily as needed (for allergies).     . fluticasone (FLONASE) 50 MCG/ACT nasal spray Place 1 spray into the nose daily as needed for allergies or rhinitis.     . furosemide (LASIX) 20 MG tablet Take 1 tablet (20 mg total) by mouth daily. 30 tablet 0  . lidocaine-prilocaine (EMLA) cream Apply to affected area once 30 g 3  . Multiple Vitamin (MULTI-VITAMINS) TABS Take 1 tablet by mouth daily.     . ondansetron (ZOFRAN) 8 MG tablet Take 1 tablet (8 mg total) by mouth 2 (two) times daily as needed for refractory nausea / vomiting. Start on day 3 after carboplatin chemo. 30 tablet 1  . Oxycodone HCl 10 MG  TABS Take 1 tablet (10 mg total) by mouth every 6 (six) hours as needed. 30 tablet 0  . polyethylene glycol (MIRALAX / GLYCOLAX) packet Take 17 g by mouth daily. 30 each 0  . potassium chloride (K-DUR) 10 MEQ tablet Take 1 tablet (10 mEq total) by mouth daily. 30 tablet 0  . ranitidine (ZANTAC) 300 MG tablet Take 300 mg by mouth at bedtime.    Marland Kitchen rOPINIRole (REQUIP) 1 MG tablet Take 1 mg by mouth at bedtime.     . senna (SENOKOT) 8.6 MG TABS tablet Take 1 tablet (8.6 mg total) by mouth daily. 120 each 1  . temazepam (RESTORIL) 15 MG capsule Take 15-30 mg by mouth at bedtime as needed for sleep.     Marland Kitchen prochlorperazine (COMPAZINE) 10 MG tablet Take 1 tablet (10 mg total) by mouth every 6 (six) hours as needed for nausea or vomiting. 40 tablet 1   No current facility-administered medications for this visit.     PHYSICAL EXAMINATION: ECOG PERFORMANCE STATUS: 2 - Symptomatic, <50% confined to bed  BP (!) 108/51 (BP Location: Left Arm, Patient Position: Sitting)   Pulse 72   Temp 98.4 F (36.9 C) (Oral)   Resp (!) 22   Ht 5\' 10"  (1.778 m)   Wt 189 lb (85.7 kg)   BMI 27.12 kg/m  All Filed Weights   08/10/17 0946  Weight: 189 lb (85.7 kg)    GENERAL: Well-nourished  well-developed; Alert, no distress and comfortable.  Accompanied by family eYES: no pallor or icterus OROPHARYNX: no thrush or ulceration; NECK: supple; no lymph nodes felt. LYMPH:  no palpable lymphadenopathy in the axillary or inguinal regions LUNGS: Decreased breath sounds auscultation bilaterally. No wheeze or crackles HEART/CVS: regular rate & rhythm and no murmurs; No lower extremity edema ABDOMEN:abdomen soft, non-tender and normal bowel sounds.  Positive for melena no splenomegaly.  Musculoskeletal:no cyanosis of digits and no clubbing  PSYCH: alert & oriented x 3 with fluent speech NEURO: no focal motor/sensory deficits SKIN:  no rashes or significant lesions    LABORATORY DATA:  I have reviewed the data as listed    Component Value Date/Time   NA 137 08/19/2017 0959   NA 143 01/24/2014 1057   K 3.7 08/19/2017 0959   K 3.9 01/24/2014 1057   CL 102 08/19/2017 0959   CL 106 01/24/2014 1057   CO2 26 08/19/2017 0959   CO2 27 01/24/2014 1057   GLUCOSE 150 (H) 08/19/2017 0959   GLUCOSE 105 (H) 01/24/2014 1057   BUN 26 (H) 08/19/2017 0959   BUN 24 (H) 01/24/2014 1057   CREATININE 1.03 08/19/2017 0959   CREATININE 1.19 01/24/2014 1057   CALCIUM 8.8 (L) 08/19/2017 0959   CALCIUM 8.9 01/24/2014 1057   PROT 6.5 08/19/2017 0959   PROT 6.8 01/24/2014 1057   ALBUMIN 3.2 (L) 08/19/2017 0959   ALBUMIN 3.5 01/24/2014 1057   AST 62 (H) 08/19/2017 0959   AST 19 01/24/2014 1057   ALT 41 08/19/2017 0959   ALT 25 01/24/2014 1057   ALKPHOS 107 08/19/2017 0959   ALKPHOS 73 01/24/2014 1057   BILITOT 1.3 (H) 08/19/2017 0959   BILITOT 0.6 01/24/2014 1057   GFRNONAA >60 08/19/2017 0959   GFRNONAA >60 01/24/2014 1057   GFRNONAA 50 (L) 10/21/2013 0447   GFRAA >60 08/19/2017 0959   GFRAA >60 01/24/2014 1057   GFRAA 58 (L) 10/21/2013 0447    No results found for: SPEP, UPEP  Lab Results  Component Value Date  WBC 0.9 (LL) 08/19/2017   NEUTROABS 0.1 (L) 08/19/2017   HGB 10.9 (L)  08/19/2017   HCT 31.8 (L) 08/19/2017   MCV 99.4 08/19/2017   PLT 64 (L) 08/19/2017      Chemistry      Component Value Date/Time   NA 137 08/19/2017 0959   NA 143 01/24/2014 1057   K 3.7 08/19/2017 0959   K 3.9 01/24/2014 1057   CL 102 08/19/2017 0959   CL 106 01/24/2014 1057   CO2 26 08/19/2017 0959   CO2 27 01/24/2014 1057   BUN 26 (H) 08/19/2017 0959   BUN 24 (H) 01/24/2014 1057   CREATININE 1.03 08/19/2017 0959   CREATININE 1.19 01/24/2014 1057      Component Value Date/Time   CALCIUM 8.8 (L) 08/19/2017 0959   CALCIUM 8.9 01/24/2014 1057   ALKPHOS 107 08/19/2017 0959   ALKPHOS 73 01/24/2014 1057   AST 62 (H) 08/19/2017 0959   AST 19 01/24/2014 1057   ALT 41 08/19/2017 0959   ALT 25 01/24/2014 1057   BILITOT 1.3 (H) 08/19/2017 0959   BILITOT 0.6 01/24/2014 1057       RADIOGRAPHIC STUDIES: I have personally reviewed the radiological images as listed and agreed with the findings in the report. No results found.   ASSESSMENT & PLAN:  [name] #Small cell lung cancer-.  Biopsy metastatic stage IV; proceed with cycle #1 of carbo etoposide plus Tecentriq.  Also with Neulasta.  #Discussed the treatments are palliative not curative; however would have a high response rates.  Unfortunately responses are short-lived.  #Individual potential side effects include but not limited to nausea vomiting fatigue; you need chemotherapy side effects including diarrhea and other autoimmune side effects.  #Recommend MRI of the brain-given the risk of brain mets.  #Follow-up in approximately 10 days; to review the results of the MRI/labs fluids.    Orders Placed This Encounter  Procedures  . Protime-INR    Standing Status:   Future    Number of Occurrences:   1    Standing Expiration Date:   08/11/2018  . Urinalysis, Complete w Microscopic    Standing Status:   Future    Number of Occurrences:   1    Standing Expiration Date:   08/11/2018   All  questions were answered. The patient knows to call the clinic with any problems, questions or concerns.      Cammie Sickle, MD 08/19/2017 11:29 PM

## 2017-08-10 NOTE — Progress Notes (Signed)
  Oncology Nurse Navigator Documentation  Navigator Location: [facility] (08/10/17 1100)   )Navigator Encounter Type: Treatment (08/10/17 1100)                   Treatment Initiated Date: 08/10/17 (08/10/17 1100) Patient Visit Type: MedOnc (08/10/17 1100) Treatment Phase: First Chemo Tx (08/10/17 1100) Barriers/Navigation Needs: No barriers at this time (08/10/17 1100)   Interventions: None required (08/10/17 1100)           met with patient and son prior to receiving first chemo treatment today. All questions answered at the time of visit. Pt and son informed that will be given next scheduled appts while pt is receiving treatment today and scheduler will review upcoming appts with him. Nothing further needed at this time. Informed pt and son to call if has any further questions or needs. Pt and son verbalized understanding.           Time Spent with Patient: 30 (08/10/17 1100)

## 2017-08-10 NOTE — Assessment & Plan Note (Addendum)
#  Small cell lung cancer-.  Biopsy metastatic stage IV; proceed with cycle #1 of carbo etoposide plus Tecentriq.  Also with Neulasta.  #Discussed the treatments are palliative not curative; however would have a high response rates.  Unfortunately responses are short-lived.  #Individual potential side effects include but not limited to nausea vomiting fatigue; you need chemotherapy side effects including diarrhea and other autoimmune side effects.  #Recommend MRI of the brain-given the risk of brain mets.  #Follow-up in approximately 10 days; to review the results of the MRI/labs fluids.

## 2017-08-10 NOTE — Progress Notes (Signed)
Patient reports constipation. Last BM 4-5 days ago. Patient is only using miralax for constipation. Patient reports decrease urinary output. Patient took lasix this am at 5 and has not yet urinated.

## 2017-08-10 NOTE — Discharge Summary (Signed)
[facility] [place] NAME: Phillip Sanchez    MR#:  [id number]  DATE OF BIRTH:  08-04-1927  DATE OF ADMISSION:  07/26/2017 ADMITTING PHYSICIAN: Harrie Foreman, MD  DATE OF DISCHARGE: 07/31/2017  2:01 PM  PRIMARY CARE PHYSICIAN: Idelle Crouch, MD   ADMISSION DIAGNOSIS:  Acute pulmonary edema ([place]) [J81.0] Generalized abdominal pain [R10.84] Hypoxia [R09.02] Dyspnea, unspecified type [R06.00]  DISCHARGE DIAGNOSIS:  Active Problems:   CAP (community acquired pneumonia)   Liver masses   Goals of care, counseling/discussion   Palliative care by specialist   SECONDARY DIAGNOSIS:   Past Medical History:  Diagnosis Date  . Anemia   . CHF (congestive heart failure) ([place])   . Colon cancer [facility])    He states they removed cancerous polyps.  . Hypercholesteremia   . Hypertension   . Lung cancer ([place])   . Myocardial infarction ([place])   . Osteoarthritis   . Prostate cancer [facility])    Prostatectomy.  . Small cell lung cancer ([place]) 08/04/2017     ADMITTING HISTORY  Chief Complaint: Shortness of breath HPI: The patient with past medical history of colon cancer, prostate cancer status post prostatectomy and left lower lung adenocarcinoma in remission presents to the emergency department with shortness of breath.  He also admits to abdominal pain and lower extremity edema.  The patient also has had some urinary urgency but denies dysuria, fever, nausea or vomiting.  Chest x-ray in the emergency department revealed pulmonary edema as well as left lung opacity concerning for pneumonia.  The patient also had small effusions.  He was given Lasix 60 mg IV and has had good urine output.  CT of his abdomen revealed multiple lesions in the liver concerning for prostatic disease which prompted the emergency department staff to call the hospitalist service for admission.    HOSPITAL COURSE:   This is an 84 year old male admitted for SOB initially  thought to have pneumonia/sepsis.  * Pneumonia Ruled out. Off Abx. continue oxygen via n.c. off BiPAP , andout of ICU. Bilateral effusions are small and likely do not need drainage at this time. breathing treatments as needed. Started lasix for pulm edema due to hypoalbuminemia  * Sepsis: ruled out  * Metastatic disease: Multiple liver lesions worrisome for metastases. Consulted oncology.  History of prostate and colon cancer status post prostatectomy. Patient had ultrasound-guided biopsy. Results pending Patient's CEA is mildly elevated. Suspicious for either colon or lung cancer.  *CAD: Elevated troponin likely due to supply demand ischemia. No MI. On ASA and plavix Held for biopsy and then restarted  *Hypertension: continue coreg and benazepril. Prn hydralazine  *DVT prophylaxis: Heparin  Stable for discharge home with home health      CONSULTS OBTAINED:  Treatment Team:  Lloyd Huger, MD  DRUG ALLERGIES:  No Known Allergies  DISCHARGE MEDICATIONS:   Allergies as of 07/31/2017   No Known Allergies     Medication List    TAKE these medications   amLODipine-benazepril 10-40 MG capsule Commonly known as:  LOTREL Take 1 capsule by mouth daily.   aspirin EC 81 MG tablet Take 81 mg by mouth daily.   atorvastatin 20 MG tablet Commonly known as:  LIPITOR Take 20 mg by mouth at bedtime.   carvedilol 25 MG tablet Commonly known as:  COREG Take 25 mg by mouth 2 (two) times daily.   clopidogrel 75 MG tablet Commonly known as:  PLAVIX Take 75 mg by  mouth daily.   docusate sodium 100 MG capsule Commonly known as:  COLACE Take 1 capsule (100 mg total) by mouth daily as needed. What changed:  reasons to take this   fexofenadine-pseudoephedrine 180-240 MG 24 hr tablet Commonly known as:  ALLEGRA-D 24 Take 1 tablet by mouth daily as needed (for allergies).   fluticasone 50 MCG/ACT nasal spray Commonly known as:  FLONASE Place 1 spray into  the nose daily as needed for allergies or rhinitis.   furosemide 20 MG tablet Commonly known as:  LASIX Take 1 tablet (20 mg total) by mouth daily.   MULTI-VITAMINS Tabs Take 1 tablet by mouth daily.   potassium chloride 10 MEQ tablet Commonly known as:  K-DUR Take 1 tablet (10 mEq total) by mouth daily.   ranitidine 300 MG tablet Commonly known as:  ZANTAC Take 300 mg by mouth at bedtime.   rOPINIRole 1 MG tablet Commonly known as:  REQUIP Take 1 mg by mouth at bedtime.   temazepam 15 MG capsule Commonly known as:  RESTORIL Take 15-30 mg by mouth at bedtime as needed for sleep.       Today   VITAL SIGNS:  Blood pressure (!) 147/53, pulse 72, temperature (!) 97.4 F (36.3 C), temperature source Oral, resp. rate 20, height 5\' 10"  (1.778 m), weight 85.1 kg (187 lb 9 oz), SpO2 94 %.  I/O:  No intake or output data in the 24 hours ending 08/10/17 1039  PHYSICAL EXAMINATION:  Physical Exam  GENERAL:  84 y.o.-year-old patient lying in the bed with no acute distress.  LUNGS: Normal breath sounds bilaterally, no wheezing, rales,rhonchi or crepitation. No use of accessory muscles of respiration.  CARDIOVASCULAR: S1, S2 normal. No murmurs, rubs, or gallops.  ABDOMEN: Soft, non-tender, non-distended. Bowel sounds present. No organomegaly or mass.  NEUROLOGIC: Moves all 4 extremities. PSYCHIATRIC: The patient is alert and oriented x 3.  SKIN: No obvious rash, lesion, or ulcer.   DATA REVIEW:   CBC Recent Labs  Lab 08/10/17 0922  WBC 12.5*  HGB 10.8*  HCT 31.9*  PLT 235    Chemistries  Recent Labs  Lab 08/10/17 0922  NA 134*  K 4.3  CL 99  CO2 26  GLUCOSE 191*  BUN 30*  CREATININE 1.42*  CALCIUM 8.5*  AST 94*  ALT 48*  ALKPHOS 120  BILITOT 0.9    Cardiac Enzymes No results for input(s): TROPONINI in the last 168 hours.  Microbiology Results  Results for orders placed or performed during the hospital encounter of 07/26/17  MRSA PCR Screening      Status: None   Collection Time: 07/26/17  3:50 PM  Result Value Ref Range Status   MRSA by PCR NEGATIVE NEGATIVE Final    Comment:        The GeneXpert MRSA Assay (FDA approved for NASAL specimens only), is one component of a comprehensive MRSA colonization surveillance program. It is not intended to diagnose MRSA infection nor to guide or monitor treatment for MRSA infections. Performed at [facility], [address], [place], [place] [address]     RADIOLOGY:  No results found.  Follow up with PCP in 1 week.  Management plans discussed with the patient, family and they are in agreement.  CODE STATUS:  Code Status History    Date Active Date Inactive Code Status Order ID Comments User Context   07/28/2017 1554 07/31/2017 1706 DNR [id number]  Max Sane, MD Inpatient   07/26/2017 1552 07/28/2017 1554  Full Code [id number]  Harrie Foreman, MD Inpatient    Questions for Most Recent Historical Code Status (Order [id number])    Question Answer Comment   In the event of cardiac or respiratory ARREST Do not call a "code blue"    In the event of cardiac or respiratory ARREST Do not perform Intubation, CPR, defibrillation or ACLS    In the event of cardiac or respiratory ARREST Use medication by any route, position, wound care, and other measures to relive pain and suffering. May use oxygen, suction and manual treatment of airway obstruction as needed for comfort.       TOTAL TIME TAKING CARE OF THIS PATIENT ON DAY OF DISCHARGE: more than 30 minutes.   Leia Alf Jaramiah Bossard M.D on 08/10/2017 at 10:39 AM  Between 7am to 6pm - Pager - [phone]  After 6pm go to [email or web address] - password [email or web address] [facility]  Office  [phone]  CC: Primary care physician; Idelle Crouch, MD  Note: This dictation was prepared with Dragon dictation along with smaller phrase technology. Any transcriptional errors that result from this process are unintentional.

## 2017-08-11 ENCOUNTER — Inpatient Hospital Stay: Payer: Medicare Other

## 2017-08-11 VITALS — BP 122/63 | HR 71 | Temp 97.4°F | Resp 20

## 2017-08-11 DIAGNOSIS — Z5111 Encounter for antineoplastic chemotherapy: Secondary | ICD-10-CM | POA: Diagnosis not present

## 2017-08-11 DIAGNOSIS — C3490 Malignant neoplasm of unspecified part of unspecified bronchus or lung: Secondary | ICD-10-CM

## 2017-08-11 MED ORDER — SODIUM CHLORIDE 0.9 % IV SOLN
200.0000 mg | Freq: Once | INTRAVENOUS | Status: AC
  Administered 2017-08-11: 200 mg via INTRAVENOUS
  Filled 2017-08-11: qty 10

## 2017-08-11 MED ORDER — SODIUM CHLORIDE 0.9% FLUSH
10.0000 mL | INTRAVENOUS | Status: DC | PRN
  Administered 2017-08-11: 10 mL via INTRAVENOUS
  Filled 2017-08-11: qty 10

## 2017-08-11 MED ORDER — SODIUM CHLORIDE 0.9 % IV SOLN
Freq: Once | INTRAVENOUS | Status: AC
  Administered 2017-08-11: 14:00:00 via INTRAVENOUS
  Filled 2017-08-11: qty 1000

## 2017-08-11 MED ORDER — DEXAMETHASONE SODIUM PHOSPHATE 10 MG/ML IJ SOLN
10.0000 mg | Freq: Once | INTRAMUSCULAR | Status: AC
  Administered 2017-08-11: 10 mg via INTRAVENOUS
  Filled 2017-08-11: qty 1

## 2017-08-11 MED ORDER — SODIUM CHLORIDE 0.9 % IV SOLN: 10.0000 mg | Freq: Once | INTRAVENOUS | Status: DC

## 2017-08-11 MED ORDER — HYDROCODONE-ACETAMINOPHEN 5-325 MG PO TABS: 1.0000 | ORAL_TABLET | Freq: Once | ORAL | Status: DC

## 2017-08-11 MED ORDER — HEPARIN SOD (PORK) LOCK FLUSH 100 UNIT/ML IV SOLN
500.0000 [IU] | Freq: Once | INTRAVENOUS | Status: AC
  Administered 2017-08-11: 500 [IU] via INTRAVENOUS

## 2017-08-11 NOTE — Progress Notes (Signed)
Patient c/o abdominal pain when first arrived to clinic. Order obtained for pain meds. Patient refused med stating his pain was better now. Resting comfortably in the chair.

## 2017-08-11 NOTE — Progress Notes (Signed)
Nutrition Assessment   Reason for Assessment:   Weight loss  ASSESSMENT:  84 year old male with lung cancer with metastatic disease to liver.  Patient starting on chemotherapy.  Past medical history of prostate cancer, colon cancer, CAD, CHF, HTN, MI  Met with patient and son during infusion.  Patient reports appetite was the best it has been in the last 2 weeks (received steriods during treatment).  Reports ate sausage, egg biscuit yesterday, cheese toast this am.  Usually a sandwich for lunch and dinner is meat and vegetables.  Wife still cooks but not much due to memory issues.  Patient reports biggest issues is not appetite.  Noted constipation, swallowing, burping, reflux issues as well and medications have been added to help.  Patient has not tried oral nutrition supplements but has a refrigerator full per son  Nutrition Focused Physical Exam: deferred  Medications: colace, miralax, lasix, MVI, zofran, marinol  Labs: Na 134, glucose 191, Bun 30, creat 1.42  Anthropometrics:   Height: 70 inches Weight: 189 lb UBW: 190 lb  (Noted 195 lb on 12/6) BMI: 27  3% weight loss in the last 6 months, not significant   Estimated Energy Needs  Kcals: 2125-2500 calories/d Protein: 106-125 g/d Fluid: 2.5 L/d  NUTRITION DIAGNOSIS: Inadequate oral intake related to poor appetite, no taste for food over the last 2 weeks as evidenced by pt reported intake  MALNUTRITION DIAGNOSIS: continue to monitor   INTERVENTION:  Encouraged patient trying oral nutrition supplements.  Son planning to mix with ice cream.  Discussed good sources of protein, handout provided. Discussed strategies to increase calories and protein.  Hand out provided. Contact information provided    MONITORING, EVALUATION, GOAL: weight trends, intake   NEXT VISIT: July 18 during infusion  Phillip Sanchez B. Zenia Resides, [place], [place] Registered Dietitian [phone] (pager)

## 2017-08-11 NOTE — Progress Notes (Signed)
Patient states he has 8 out of 10 pain in his stomach and forgot to take his pain medicine before leaving home.  Dr. Rogue Bussing ordered Oxycodone 5/325 p.o.

## 2017-08-12 ENCOUNTER — Inpatient Hospital Stay: Payer: Medicare Other

## 2017-08-12 VITALS — BP 116/60 | HR 73 | Temp 98.3°F | Resp 18

## 2017-08-12 DIAGNOSIS — Z5111 Encounter for antineoplastic chemotherapy: Secondary | ICD-10-CM | POA: Diagnosis not present

## 2017-08-12 DIAGNOSIS — C3490 Malignant neoplasm of unspecified part of unspecified bronchus or lung: Secondary | ICD-10-CM

## 2017-08-12 MED ORDER — HEPARIN SOD (PORK) LOCK FLUSH 100 UNIT/ML IV SOLN
500.0000 [IU] | Freq: Once | INTRAVENOUS | Status: AC | PRN
  Administered 2017-08-12: 500 [IU]
  Filled 2017-08-12: qty 5

## 2017-08-12 MED ORDER — ETOPOSIDE CHEMO INJECTION 1 GM/50ML
200.0000 mg | Freq: Once | INTRAVENOUS | Status: AC
  Administered 2017-08-12: 200 mg via INTRAVENOUS
  Filled 2017-08-12: qty 10

## 2017-08-12 MED ORDER — SODIUM CHLORIDE 0.9% FLUSH
10.0000 mL | INTRAVENOUS | Status: DC | PRN
  Administered 2017-08-12: 10 mL
  Filled 2017-08-12: qty 10

## 2017-08-12 MED ORDER — SODIUM CHLORIDE 0.9 % IV SOLN
Freq: Once | INTRAVENOUS | Status: AC
  Administered 2017-08-12: 14:00:00 via INTRAVENOUS
  Filled 2017-08-12: qty 1000

## 2017-08-12 MED ORDER — DEXAMETHASONE SODIUM PHOSPHATE 10 MG/ML IJ SOLN
10.0000 mg | Freq: Once | INTRAMUSCULAR | Status: AC
  Administered 2017-08-12: 10 mg via INTRAVENOUS
  Filled 2017-08-12: qty 1

## 2017-08-12 MED ORDER — SODIUM CHLORIDE 0.9 % IV SOLN: 10.0000 mg | Freq: Once | INTRAVENOUS | Status: DC

## 2017-08-15 ENCOUNTER — Inpatient Hospital Stay: Payer: Medicare Other | Attending: Internal Medicine

## 2017-08-15 VITALS — BP 166/73 | HR 78 | Resp 20

## 2017-08-15 DIAGNOSIS — D701 Agranulocytosis secondary to cancer chemotherapy: Secondary | ICD-10-CM | POA: Diagnosis not present

## 2017-08-15 DIAGNOSIS — R600 Localized edema: Secondary | ICD-10-CM | POA: Insufficient documentation

## 2017-08-15 DIAGNOSIS — Z79899 Other long term (current) drug therapy: Secondary | ICD-10-CM | POA: Insufficient documentation

## 2017-08-15 DIAGNOSIS — R109 Unspecified abdominal pain: Secondary | ICD-10-CM | POA: Diagnosis not present

## 2017-08-15 DIAGNOSIS — Z5189 Encounter for other specified aftercare: Secondary | ICD-10-CM | POA: Insufficient documentation

## 2017-08-15 DIAGNOSIS — Z5111 Encounter for antineoplastic chemotherapy: Secondary | ICD-10-CM | POA: Diagnosis not present

## 2017-08-15 DIAGNOSIS — Z85038 Personal history of other malignant neoplasm of large intestine: Secondary | ICD-10-CM | POA: Diagnosis not present

## 2017-08-15 DIAGNOSIS — Z7982 Long term (current) use of aspirin: Secondary | ICD-10-CM | POA: Insufficient documentation

## 2017-08-15 DIAGNOSIS — Z923 Personal history of irradiation: Secondary | ICD-10-CM | POA: Insufficient documentation

## 2017-08-15 DIAGNOSIS — I2510 Atherosclerotic heart disease of native coronary artery without angina pectoris: Secondary | ICD-10-CM | POA: Insufficient documentation

## 2017-08-15 DIAGNOSIS — Z8546 Personal history of malignant neoplasm of prostate: Secondary | ICD-10-CM | POA: Insufficient documentation

## 2017-08-15 DIAGNOSIS — I739 Peripheral vascular disease, unspecified: Secondary | ICD-10-CM | POA: Insufficient documentation

## 2017-08-15 DIAGNOSIS — R0602 Shortness of breath: Secondary | ICD-10-CM | POA: Diagnosis not present

## 2017-08-15 DIAGNOSIS — L03116 Cellulitis of left lower limb: Secondary | ICD-10-CM | POA: Diagnosis not present

## 2017-08-15 DIAGNOSIS — M7989 Other specified soft tissue disorders: Secondary | ICD-10-CM | POA: Insufficient documentation

## 2017-08-15 DIAGNOSIS — C3490 Malignant neoplasm of unspecified part of unspecified bronchus or lung: Secondary | ICD-10-CM

## 2017-08-15 DIAGNOSIS — C7931 Secondary malignant neoplasm of brain: Secondary | ICD-10-CM | POA: Diagnosis not present

## 2017-08-15 DIAGNOSIS — C3432 Malignant neoplasm of lower lobe, left bronchus or lung: Secondary | ICD-10-CM | POA: Diagnosis present

## 2017-08-15 DIAGNOSIS — C787 Secondary malignant neoplasm of liver and intrahepatic bile duct: Secondary | ICD-10-CM | POA: Insufficient documentation

## 2017-08-15 MED ORDER — PEGFILGRASTIM-CBQV 6 MG/0.6ML ~~LOC~~ SOSY
6.0000 mg | PREFILLED_SYRINGE | Freq: Once | SUBCUTANEOUS | Status: AC
  Administered 2017-08-15: 6 mg via SUBCUTANEOUS
  Filled 2017-08-15: qty 0.6

## 2017-08-17 ENCOUNTER — Ambulatory Visit
Admission: RE | Admit: 2017-08-17 | Discharge: 2017-08-17 | Disposition: A | Discharge status: Home / Self Care | Payer: Medicare Other | Source: Ambulatory Visit | Attending: Oncology | Admitting: Oncology

## 2017-08-17 ENCOUNTER — Telehealth: Payer: Self-pay | Admitting: *Deleted

## 2017-08-17 ENCOUNTER — Telehealth: Payer: Self-pay | Admitting: Internal Medicine

## 2017-08-17 DIAGNOSIS — C7931 Secondary malignant neoplasm of brain: Secondary | ICD-10-CM | POA: Insufficient documentation

## 2017-08-17 DIAGNOSIS — C3490 Malignant neoplasm of unspecified part of unspecified bronchus or lung: Secondary | ICD-10-CM | POA: Diagnosis present

## 2017-08-17 DIAGNOSIS — I739 Peripheral vascular disease, unspecified: Secondary | ICD-10-CM | POA: Diagnosis not present

## 2017-08-17 DIAGNOSIS — G319 Degenerative disease of nervous system, unspecified: Secondary | ICD-10-CM | POA: Diagnosis not present

## 2017-08-17 MED ORDER — GADOBENATE DIMEGLUMINE 529 MG/ML IV SOLN
17.0000 mL | Freq: Once | INTRAVENOUS | Status: AC | PRN
  Administered 2017-08-17: 17 mL via INTRAVENOUS

## 2017-08-17 NOTE — Telephone Encounter (Signed)
Spoke to pt's son- re: MRI brain mets; hold off steroids for now; make referral to Dr.Crystal

## 2017-08-17 NOTE — Telephone Encounter (Signed)
Apt - for Friday 7/5 moved to am with Dr. Jacinto Reap. Pt's son aware of new apt times.

## 2017-08-19 ENCOUNTER — Other Ambulatory Visit: Payer: Self-pay

## 2017-08-19 ENCOUNTER — Inpatient Hospital Stay (HOSPITAL_BASED_OUTPATIENT_CLINIC_OR_DEPARTMENT_OTHER): Payer: Medicare Other | Admitting: Internal Medicine

## 2017-08-19 ENCOUNTER — Inpatient Hospital Stay: Payer: Medicare Other

## 2017-08-19 ENCOUNTER — Ambulatory Visit: Payer: Medicare Other

## 2017-08-19 ENCOUNTER — Encounter: Payer: Self-pay | Admitting: Internal Medicine

## 2017-08-19 VITALS — BP 131/54 | HR 83 | Temp 98.1°F | Resp 18 | Ht 70.0 in | Wt 181.2 lb

## 2017-08-19 DIAGNOSIS — C3490 Malignant neoplasm of unspecified part of unspecified bronchus or lung: Secondary | ICD-10-CM

## 2017-08-19 DIAGNOSIS — D701 Agranulocytosis secondary to cancer chemotherapy: Secondary | ICD-10-CM | POA: Diagnosis not present

## 2017-08-19 DIAGNOSIS — Z7982 Long term (current) use of aspirin: Secondary | ICD-10-CM

## 2017-08-19 DIAGNOSIS — Z5111 Encounter for antineoplastic chemotherapy: Secondary | ICD-10-CM | POA: Diagnosis not present

## 2017-08-19 DIAGNOSIS — Z923 Personal history of irradiation: Secondary | ICD-10-CM

## 2017-08-19 DIAGNOSIS — C787 Secondary malignant neoplasm of liver and intrahepatic bile duct: Secondary | ICD-10-CM | POA: Diagnosis not present

## 2017-08-19 DIAGNOSIS — I2510 Atherosclerotic heart disease of native coronary artery without angina pectoris: Secondary | ICD-10-CM

## 2017-08-19 DIAGNOSIS — M7989 Other specified soft tissue disorders: Secondary | ICD-10-CM

## 2017-08-19 DIAGNOSIS — C7931 Secondary malignant neoplasm of brain: Secondary | ICD-10-CM

## 2017-08-19 DIAGNOSIS — C3432 Malignant neoplasm of lower lobe, left bronchus or lung: Secondary | ICD-10-CM

## 2017-08-19 DIAGNOSIS — Z8546 Personal history of malignant neoplasm of prostate: Secondary | ICD-10-CM

## 2017-08-19 DIAGNOSIS — R109 Unspecified abdominal pain: Secondary | ICD-10-CM

## 2017-08-19 DIAGNOSIS — I739 Peripheral vascular disease, unspecified: Secondary | ICD-10-CM

## 2017-08-19 DIAGNOSIS — Z85038 Personal history of other malignant neoplasm of large intestine: Secondary | ICD-10-CM

## 2017-08-19 LAB — COMPREHENSIVE METABOLIC PANEL
ALK PHOS: 107 U/L (ref 38–126)
ALT: 41 U/L (ref 0–44)
ANION GAP: 9 (ref 5–15)
AST: 62 U/L — ABNORMAL HIGH (ref 15–41)
Albumin: 3.2 g/dL — ABNORMAL LOW (ref 3.5–5.0)
BUN: 26 mg/dL — ABNORMAL HIGH (ref 8–23)
CALCIUM: 8.8 mg/dL — AB (ref 8.9–10.3)
CHLORIDE: 102 mmol/L (ref 98–111)
CO2: 26 mmol/L (ref 22–32)
Creatinine, Ser: 1.03 mg/dL (ref 0.61–1.24)
GFR calc non Af Amer: >60 mL/min (ref >60)
Glucose, Bld: 150 mg/dL — ABNORMAL HIGH (ref 70–99)
POTASSIUM: 3.7 mmol/L (ref 3.5–5.1)
SODIUM: 137 mmol/L (ref 135–145)
Total Bilirubin: 1.3 mg/dL — ABNORMAL HIGH (ref 0.3–1.2)
Total Protein: 6.5 g/dL (ref 6.5–8.1)

## 2017-08-19 LAB — TSH: TSH: 1.17 u[IU]/mL (ref 0.350–4.500)

## 2017-08-19 NOTE — Progress Notes (Signed)
[facility] [place] OFFICE PROGRESS NOTE  Patient Care Team: Idelle Crouch, MD as PCP - General (Internal Medicine) Telford Nab, RN as Registered Nurse Earlie Server, MD as Medical Oncologist (Medical Oncology) Teodoro Spray, MD as Consulting Physician (Cardiology)  Cancer Staging No matching staging information was found for the patient.   Oncology History   # 2015- LLL [place] ca stage I s/p SBRT [Dr.Crystal]  # 3rd June 2019- Multiple liver lesions- s/p Bx- SMALL CELL of lung origin; STAGE IV; July 3rd 2019- MRI brain ~ 78mm x 2 brain mets   # June 26th- carbo-etop-atezo  # Prostate cancer [ 32 years ago]; s/p surgery  # colon cancer [s/p resection of malignant polyps; Dr/Elliot; No colectomy]  # hx CAD [asprin/plavix]; hx PVD  -----------------------------------------------   DIAGNOSIS: Small cell lung cancer  STAGE:  IV     ;GOALS: Palliative  CURRENT/MOST RECENT THERAPY: carbo-etop-Atezo      [name]    Small cell lung cancer ([place])   08/04/2017 Initial Diagnosis    Small cell lung cancer ([place])      08/04/2017 -  Chemotherapy    The patient had palonosetron (ALOXI) injection 0.25 mg, 0.25 mg, Intravenous,  Once, 1 of 4 cycles Administration: 0.25 mg (08/10/2017) pegfilgrastim-cbqv (UDENYCA) injection 6 mg, 6 mg, Subcutaneous, Once, 1 of 4 cycles Administration: 6 mg (08/15/2017) CARBOplatin (PARAPLATIN) 340 mg in sodium chloride 0.9 % 250 mL chemo infusion, 340 mg (100 % of original dose 340.5 mg), Intravenous,  Once, 1 of 4 cycles Dose modification:   (original dose 340.5 mg, Cycle 1) Administration: 340 mg (08/10/2017) etoposide (VEPESID) 200 mg in sodium chloride 0.9 % 500 mL chemo infusion, 210 mg, Intravenous,  Once, 1 of 4 cycles Administration: 200 mg (08/10/2017), 200 mg (08/11/2017), 200 mg (08/12/2017) fosaprepitant (EMEND) 150 mg, dexamethasone (DECADRON) 12 mg in sodium chloride 0.9 % 145 mL IVPB, , Intravenous,   Once, 1 of 4 cycles Administration:  (08/10/2017) atezolizumab (TECENTRIQ) 1,200 mg in sodium chloride 0.9 % 250 mL chemo infusion, 1,200 mg, Intravenous, Once, 1 of 4 cycles Administration: 1,200 mg (08/10/2017)  for chemotherapy treatment.          INTERVAL HISTORY:  BLAYDE BACIGALUPI 84 y.o.  male pleasant patient above history of extensive stage small cell lung cancer currently status post cycle #1 of carbo etoposide plus Tecentriq approximately 10 days ago is here to review the results of MRI brain.  As per the son patient's appetite is improving.  Abdominal pain improving not resolved.  Denies any headaches.  Denies any falls.   Patient continues to complain of bilateral leg swelling.  He recently was started on Lasix with PCP.  Leg swelling has improved.  Not resolved.  Review of Systems  Constitutional: Positive for malaise/fatigue and weight loss. Negative for chills, diaphoresis and fever.  HENT: Negative for nosebleeds and sore throat.   Eyes: Negative for double vision.  Respiratory: Negative for cough, hemoptysis, sputum production, shortness of breath and wheezing.   Cardiovascular: Positive for leg swelling. Negative for chest pain, palpitations and orthopnea.  Gastrointestinal: Positive for abdominal pain. Negative for blood in stool, constipation, diarrhea, heartburn, melena, nausea and vomiting.  Genitourinary: Negative for dysuria, frequency and urgency.  Musculoskeletal: Negative for back pain and joint pain.  Skin: Negative.  Negative for itching and rash.  Neurological: Negative for dizziness, tingling, focal weakness, weakness and headaches.  Endo/Heme/Allergies: Does not bruise/bleed easily.  Psychiatric/Behavioral: Negative for depression. The patient is not nervous/anxious and does not have insomnia.       PAST MEDICAL HISTORY :  Past Medical History:  Diagnosis Date  . Anemia   . CHF (congestive heart failure) ([place])   . Colon cancer [facility])    He states  they removed cancerous polyps.  . Hypercholesteremia   . Hypertension   . Lung cancer ([place])   . Myocardial infarction ([place])   . Osteoarthritis   . Prostate cancer [facility])    Prostatectomy.  . Small cell lung cancer ([place]) 08/04/2017    PAST SURGICAL HISTORY :   Past Surgical History:  Procedure Laterality Date  . COLON SURGERY    . CORONARY ANGIOPLASTY WITH STENT PLACEMENT    . herniorrhaphy    . PORTA CATH INSERTION N/A 08/05/2017   Procedure: PORTA CATH INSERTION;  Surgeon: Katha Cabal, MD;  Location: [facility];  Service: Cardiovascular;  Laterality: N/A;    FAMILY HISTORY :   Family History  Problem Relation Age of Onset  . Cancer Brother 16       unknown orgin  . Cancer Sister 50       unknown type    SOCIAL HISTORY:   Social History   Tobacco Use  . Smoking status: Former Smoker    Packs/day: 1.00    Years: 36.00    Pack years: 36.00    Types: Cigarettes    Last attempt to quit: 1987    Years since quitting: 32.5  . Smokeless tobacco: Never Used  Substance Use Topics  . Alcohol use: No    Alcohol/week: 0.0 oz  . Drug use: No    ALLERGIES:  has No Known Allergies.  MEDICATIONS:  Current Outpatient Medications  Medication Sig Dispense Refill  . amLODipine-benazepril (LOTREL) 10-40 MG capsule Take 1 capsule by mouth daily.    Marland Kitchen aspirin EC 81 MG tablet Take 81 mg by mouth daily.     Marland Kitchen atorvastatin (LIPITOR) 20 MG tablet Take 20 mg by mouth at bedtime.     . calcium carbonate (OS-CAL - DOSED IN MG OF ELEMENTAL CALCIUM) 1250 (500 Ca) MG tablet Take 1 tablet by mouth daily.    . carvedilol (COREG) 25 MG tablet Take 25 mg by mouth 2 (two) times daily.     . cetirizine (ZYRTEC) 10 MG tablet Take 1 tablet by mouth daily.    . clopidogrel (PLAVIX) 75 MG tablet Take 75 mg by mouth daily.     Marland Kitchen docusate sodium (COLACE) 100 MG capsule Take 1 capsule (100 mg total) by mouth daily as needed. (Patient taking differently: Take 100 mg by mouth daily as  needed for mild constipation. ) 30 capsule 2  . dronabinol (MARINOL) 5 MG capsule Take 1 capsule (5 mg total) by mouth 2 (two) times daily before a meal. 30 capsule 0  . esomeprazole (NEXIUM) 20 MG capsule Take 1 capsule (20 mg total) by mouth daily. 90 capsule 1  . fexofenadine-pseudoephedrine (ALLEGRA-D 24) 180-240 MG per 24 hr tablet Take 1 tablet by mouth daily as needed (for allergies).     . fluticasone (FLONASE) 50 MCG/ACT nasal spray Place 1 spray into the nose daily as needed for allergies or rhinitis.     . furosemide (LASIX) 20 MG tablet Take 1 tablet (20 mg total) by mouth daily. 30 tablet 0  . lidocaine-prilocaine (EMLA) cream Apply to affected area once 30 g 3  . Multiple Vitamin (MULTI-VITAMINS) TABS  Take 1 tablet by mouth daily.     . ondansetron (ZOFRAN) 8 MG tablet Take 1 tablet (8 mg total) by mouth 2 (two) times daily as needed for refractory nausea / vomiting. Start on day 3 after carboplatin chemo. 30 tablet 1  . Oxycodone HCl 10 MG TABS Take 1 tablet (10 mg total) by mouth every 6 (six) hours as needed. 30 tablet 0  . polyethylene glycol (MIRALAX / GLYCOLAX) packet Take 17 g by mouth daily. 30 each 0  . potassium chloride (K-DUR) 10 MEQ tablet Take 1 tablet (10 mEq total) by mouth daily. 30 tablet 0  . prochlorperazine (COMPAZINE) 10 MG tablet Take 1 tablet (10 mg total) by mouth every 6 (six) hours as needed for nausea or vomiting. 40 tablet 1  . ranitidine (ZANTAC) 300 MG tablet Take 300 mg by mouth at bedtime.    Marland Kitchen rOPINIRole (REQUIP) 1 MG tablet Take 1 mg by mouth at bedtime.     . senna (SENOKOT) 8.6 MG TABS tablet Take 1 tablet (8.6 mg total) by mouth daily. 120 each 1  . temazepam (RESTORIL) 15 MG capsule Take 15-30 mg by mouth at bedtime as needed for sleep.      No current facility-administered medications for this visit.     PHYSICAL EXAMINATION: ECOG PERFORMANCE STATUS: 1 - Symptomatic but completely ambulatory  BP (!) 131/54 (BP Location: Left Arm, Patient  Position: Sitting)   Pulse 83   Temp 98.1 F (36.7 C) (Oral)   Resp 18   Ht 5\' 10"  (1.778 m)   Wt 181 lb 3.2 oz (82.2 kg)   BMI 26.00 kg/m   Filed Weights   08/19/17 1021  Weight: 181 lb 3.2 oz (82.2 kg)    GENERAL: Well-nourished well-developed; Alert, no distress and comfortable.  Accompanied by son.  Patient in wheelchair. EYES: no pallor or icterus OROPHARYNX: no thrush or ulceration; NECK: supple; no lymph nodes felt. LYMPH:  no palpable lymphadenopathy in the axillary or inguinal regions LUNGS: Decreased breath sounds auscultation bilaterally. No wheeze or crackles HEART/CVS: regular rate & rhythm and no murmurs; 2+ bilateral lower extremity edema ABDOMEN:abdomen soft, non-tender and normal bowel sounds.  Positive for hepatomegaly Musculoskeletal:no cyanosis of digits and no clubbing  PSYCH: alert & oriented x 3 with fluent speech NEURO: no focal motor/sensory deficits SKIN:  no rashes or significant lesions    LABORATORY DATA:  I have reviewed the data as listed    Component Value Date/Time   NA 137 08/19/2017 0959   NA 143 01/24/2014 1057   K 3.7 08/19/2017 0959   K 3.9 01/24/2014 1057   CL 102 08/19/2017 0959   CL 106 01/24/2014 1057   CO2 26 08/19/2017 0959   CO2 27 01/24/2014 1057   GLUCOSE 150 (H) 08/19/2017 0959   GLUCOSE 105 (H) 01/24/2014 1057   BUN 26 (H) 08/19/2017 0959   BUN 24 (H) 01/24/2014 1057   CREATININE 1.03 08/19/2017 0959   CREATININE 1.19 01/24/2014 1057   CALCIUM 8.8 (L) 08/19/2017 0959   CALCIUM 8.9 01/24/2014 1057   PROT 6.5 08/19/2017 0959   PROT 6.8 01/24/2014 1057   ALBUMIN 3.2 (L) 08/19/2017 0959   ALBUMIN 3.5 01/24/2014 1057   AST 62 (H) 08/19/2017 0959   AST 19 01/24/2014 1057   ALT 41 08/19/2017 0959   ALT 25 01/24/2014 1057   ALKPHOS 107 08/19/2017 0959   ALKPHOS 73 01/24/2014 1057   BILITOT 1.3 (H) 08/19/2017 0959   BILITOT 0.6 01/24/2014  [place] 08/19/2017 0959   GFRNONAA >60 01/24/2014 1057    GFRNONAA 50 (L) 10/21/2013 0447   GFRAA >60 08/19/2017 0959   GFRAA >60 01/24/2014 1057   GFRAA 58 (L) 10/21/2013 0447    No results found for: SPEP, UPEP  Lab Results  Component Value Date   WBC 0.9 (LL) 08/19/2017   NEUTROABS 0.1 (L) 08/19/2017   HGB 10.9 (L) 08/19/2017   HCT 31.8 (L) 08/19/2017   MCV 99.4 08/19/2017   PLT 64 (L) 08/19/2017      Chemistry      Component Value Date/Time   NA 137 08/19/2017 0959   NA 143 01/24/2014 1057   K 3.7 08/19/2017 0959   K 3.9 01/24/2014 1057   CL 102 08/19/2017 0959   CL 106 01/24/2014 1057   CO2 26 08/19/2017 0959   CO2 27 01/24/2014 1057   BUN 26 (H) 08/19/2017 0959   BUN 24 (H) 01/24/2014 1057   CREATININE 1.03 08/19/2017 0959   CREATININE 1.19 01/24/2014 1057      Component Value Date/Time   CALCIUM 8.8 (L) 08/19/2017 0959   CALCIUM 8.9 01/24/2014 1057   ALKPHOS 107 08/19/2017 0959   ALKPHOS 73 01/24/2014 1057   AST 62 (H) 08/19/2017 0959   AST 19 01/24/2014 1057   ALT 41 08/19/2017 0959   ALT 25 01/24/2014 1057   BILITOT 1.3 (H) 08/19/2017 0959   BILITOT 0.6 01/24/2014 1057       RADIOGRAPHIC STUDIES: I have personally reviewed the radiological images as listed and agreed with the findings in the report. No results found.   ASSESSMENT & PLAN:  [name] #Extensive stage small cell lung cancer-currently status post carboplatin and etoposide plus Tecentriq second #1 approximately 1 week ago.  #Clinically no evidence of progression; slight clinical improvement noted.  Proceed with cycle #2 as planned 17.  #Neutropenia white count 0.9/platelets 80s-from chemotherapy; patient status post Neulasta no signs of infection.  Discussed neutropenic questions.  #Bilateral leg swelling-Dopplers negative for DVT.  Likely dependent edema.  Recommend compression stockings.   #Pain MRI suggestive of asymptomatic subcentimeter brain mets; reviewed the imaging.  Ideally with the recommended whole  brain radiation; however given his age/asymptomatic lesions/low volume disease-recommend monitoring with an MRI in approximately 6 to 8 weeks.   # I reviewed the blood work- with the patient in detail; also reviewed the imaging independently [as summarized above]; and with the patient in detail.     No orders of the defined types were placed in this encounter.  All questions were answered. The patient knows to call the clinic with any problems, questions or concerns.      Cammie Sickle, MD 08/19/2017 11:22 PM

## 2017-08-19 NOTE — Telephone Encounter (Signed)
08/19/17 - Per Dr. Rogue Bussing - hold off apt with Dr. Baruch Gouty due to the small size of the lesion.

## 2017-08-19 NOTE — Assessment & Plan Note (Addendum)
#  Extensive stage small cell lung cancer-currently status post carboplatin and etoposide plus Tecentriq second #1 approximately 1 week ago.  #Clinically no evidence of progression; slight clinical improvement noted.  Proceed with cycle #2 as planned 17.  #Neutropenia white count 0.9/platelets 80s-from chemotherapy; patient status post Neulasta no signs of infection.  Discussed neutropenic questions.  #Bilateral leg swelling-Dopplers negative for DVT.  Likely dependent edema.  Recommend compression stockings.   #Pain MRI suggestive of asymptomatic subcentimeter brain mets; reviewed the imaging.  Ideally with the recommended whole brain radiation; however given his age/asymptomatic lesions/low volume disease-recommend monitoring with an MRI in approximately 6 to 8 weeks.   # I reviewed the blood work- with the patient in detail; also reviewed the imaging independently [as summarized above]; and with the patient in detail.

## 2017-08-22 ENCOUNTER — Ambulatory Visit: Payer: Medicare Other

## 2017-08-22 ENCOUNTER — Institutional Professional Consult (permissible substitution): Payer: Medicare Other | Admitting: Radiation Oncology

## 2017-08-22 LAB — CBC WITH DIFFERENTIAL/PLATELET
BASOS PCT: 8 %
Basophils Absolute: 0.1 10*3/uL (ref 0–0.1)
Eosinophils Absolute: 0.2 10*3/uL (ref 0–0.7)
Eosinophils Relative: 25 %
HEMATOCRIT: 31.8 % — AB (ref 40.0–52.0)
HEMOGLOBIN: 10.9 g/dL — AB (ref 13.0–18.0)
Lymphocytes Relative: 51 %
Lymphs Abs: 0.5 10*3/uL — ABNORMAL LOW (ref 1.0–3.6)
MCH: 34.2 pg — ABNORMAL HIGH (ref 26.0–34.0)
MCHC: 34.4 g/dL (ref 32.0–36.0)
MCV: 99.4 fL (ref 80.0–100.0)
MONOS PCT: 2 %
Monocytes Absolute: 0 10*3/uL — ABNORMAL LOW (ref 0.2–1.0)
NEUTROS ABS: 0.1 10*3/uL — AB (ref 1.7–7.7)
NEUTROS PCT: 14 %
Platelets: 64 10*3/uL — ABNORMAL LOW (ref 150–440)
RBC: 3.2 MIL/uL — ABNORMAL LOW (ref 4.40–5.90)
RDW: 13.5 % (ref 11.5–14.5)
WBC: 0.9 10*3/uL — CL (ref 3.8–10.6)

## 2017-08-26 ENCOUNTER — Other Ambulatory Visit: Payer: Medicare Other

## 2017-08-26 ENCOUNTER — Ambulatory Visit: Payer: Medicare Other | Admitting: Nurse Practitioner

## 2017-08-26 ENCOUNTER — Ambulatory Visit: Payer: Medicare Other

## 2017-08-31 ENCOUNTER — Inpatient Hospital Stay: Payer: Medicare Other

## 2017-08-31 ENCOUNTER — Encounter: Payer: Self-pay | Admitting: Internal Medicine

## 2017-08-31 ENCOUNTER — Inpatient Hospital Stay (HOSPITAL_BASED_OUTPATIENT_CLINIC_OR_DEPARTMENT_OTHER): Payer: Medicare Other | Admitting: Internal Medicine

## 2017-08-31 ENCOUNTER — Other Ambulatory Visit: Payer: Self-pay

## 2017-08-31 VITALS — BP 158/72 | HR 88 | Temp 98.7°F | Resp 20 | Ht 70.0 in | Wt 178.0 lb

## 2017-08-31 DIAGNOSIS — Z85038 Personal history of other malignant neoplasm of large intestine: Secondary | ICD-10-CM

## 2017-08-31 DIAGNOSIS — R600 Localized edema: Secondary | ICD-10-CM | POA: Diagnosis not present

## 2017-08-31 DIAGNOSIS — Z87891 Personal history of nicotine dependence: Secondary | ICD-10-CM

## 2017-08-31 DIAGNOSIS — C787 Secondary malignant neoplasm of liver and intrahepatic bile duct: Secondary | ICD-10-CM | POA: Diagnosis not present

## 2017-08-31 DIAGNOSIS — L03116 Cellulitis of left lower limb: Secondary | ICD-10-CM | POA: Diagnosis not present

## 2017-08-31 DIAGNOSIS — Z5111 Encounter for antineoplastic chemotherapy: Secondary | ICD-10-CM | POA: Diagnosis not present

## 2017-08-31 DIAGNOSIS — Z8546 Personal history of malignant neoplasm of prostate: Secondary | ICD-10-CM

## 2017-08-31 DIAGNOSIS — C3432 Malignant neoplasm of lower lobe, left bronchus or lung: Secondary | ICD-10-CM | POA: Diagnosis not present

## 2017-08-31 DIAGNOSIS — C801 Malignant (primary) neoplasm, unspecified: Secondary | ICD-10-CM

## 2017-08-31 DIAGNOSIS — C3490 Malignant neoplasm of unspecified part of unspecified bronchus or lung: Secondary | ICD-10-CM

## 2017-08-31 LAB — COMPREHENSIVE METABOLIC PANEL
ALT: 21 U/L (ref 0–44)
AST: 40 U/L (ref 15–41)
Albumin: 3 g/dL — ABNORMAL LOW (ref 3.5–5.0)
Alkaline Phosphatase: 116 U/L (ref 38–126)
Anion gap: 8 (ref 5–15)
BILIRUBIN TOTAL: 0.4 mg/dL (ref 0.3–1.2)
BUN: 13 mg/dL (ref 8–23)
CHLORIDE: 108 mmol/L (ref 98–111)
CO2: 25 mmol/L (ref 22–32)
Calcium: 8.1 mg/dL — ABNORMAL LOW (ref 8.9–10.3)
Creatinine, Ser: 1.07 mg/dL (ref 0.61–1.24)
GFR, EST NON AFRICAN AMERICAN: 60 mL/min — AB (ref >60)
Glucose, Bld: 173 mg/dL — ABNORMAL HIGH (ref 70–99)
Potassium: 3.4 mmol/L — ABNORMAL LOW (ref 3.5–5.1)
Sodium: 141 mmol/L (ref 135–145)
TOTAL PROTEIN: 6 g/dL — AB (ref 6.5–8.1)

## 2017-08-31 LAB — CBC WITH DIFFERENTIAL/PLATELET
BASOS ABS: 0.2 10*3/uL — AB (ref 0–0.1)
BASOS PCT: 1 %
EOS PCT: 0 %
Eosinophils Absolute: 0 10*3/uL (ref 0–0.7)
HCT: 29.1 % — ABNORMAL LOW (ref 40.0–52.0)
HEMOGLOBIN: 10.1 g/dL — AB (ref 13.0–18.0)
Lymphocytes Relative: 6 %
Lymphs Abs: 1.3 10*3/uL (ref 1.0–3.6)
MCH: 34 pg (ref 26.0–34.0)
MCHC: 34.8 g/dL (ref 32.0–36.0)
MCV: 97.7 fL (ref 80.0–100.0)
Monocytes Absolute: 1.4 10*3/uL — ABNORMAL HIGH (ref 0.2–1.0)
Monocytes Relative: 6 %
NEUTROS PCT: 87 %
Neutro Abs: 19.1 10*3/uL — ABNORMAL HIGH (ref 1.4–6.5)
Platelets: 481 10*3/uL — ABNORMAL HIGH (ref 150–440)
RBC: 2.98 MIL/uL — AB (ref 4.40–5.90)
RDW: 13.8 % (ref 11.5–14.5)
WBC: 22.1 10*3/uL — AB (ref 3.8–10.6)

## 2017-08-31 LAB — TSH: TSH: 0.755 u[IU]/mL (ref 0.350–4.500)

## 2017-08-31 MED ORDER — AMOXICILLIN 500 MG PO CAPS: 500.0000 mg | ORAL_CAPSULE | Freq: Three times a day (TID) | ORAL | 0 refills | Status: DC

## 2017-08-31 MED ORDER — HEPARIN SOD (PORK) LOCK FLUSH 100 UNIT/ML IV SOLN
500.0000 [IU] | Freq: Once | INTRAVENOUS | Status: AC
  Administered 2017-08-31: 500 [IU] via INTRAVENOUS
  Filled 2017-08-31: qty 5

## 2017-08-31 NOTE — Assessment & Plan Note (Addendum)
#  Extensive stage small cell lung cancer-currently status post carboplatin and etoposide plus Tecentriq second #1 approximately 3 weeks ago.  #Clinically improved. HOLD cycle #2 today; "infection of left foot"- C below.  Will push chemotherapy by 1 week.  Discussed that would recommend CT scan after 2 cycles; this could be ordered at next visit.   # Left foot early cellulitis- new; HOLD off chemo today; plan Start amoxicillin x 7 days.   #Bilateral leg swelling- Likely dependent edema; improved; Hold off compression stockings- sec to leg foot cellulitis.   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   # follow up in 1 week/MD or NP- carbo-etop- tec; day- 5 neulasta; cbc/bmp.

## 2017-08-31 NOTE — Progress Notes (Signed)
[facility] OFFICE PROGRESS NOTE  Patient Care Team: Idelle Crouch, MD as PCP - General (Internal Medicine) Telford Nab, RN as Registered Nurse [place], Javier Docker, MD as Consulting Physician (Cardiology) Cammie Sickle, MD as Medical Oncologist (Medical Oncology)  Cancer Staging No matching staging information was found for the patient.   Oncology History   # 2015- [facility] [place] ca stage I s/p SBRT [Dr.Crystal]  # 3rd June 2019- Multiple liver lesions- s/p Bx- SMALL CELL of lung origin; STAGE IV;  # June 26th- carbo-etop-atezo  # Prostate cancer [ 39 years ago]; s/p surgery  # colon cancer [s/p resection of malignant polyps; Dr/Elliot; No colectomy]  # hx CAD [asprin/plavix]; hx PVD  -----------------------------------------------   DIAGNOSIS: Small cell lung cancer  STAGE:  IV     ;GOALS: Palliative  CURRENT/MOST RECENT THERAPY: carbo-etop-Atezo      [name]    Small cell lung cancer ([place])   08/04/2017 Initial Diagnosis    Small cell lung cancer ([place])      08/04/2017 -  Chemotherapy    The patient had palonosetron (ALOXI) injection 0.25 mg, 0.25 mg, Intravenous,  Once, 1 of 4 cycles Administration: 0.25 mg (08/10/2017) pegfilgrastim-cbqv (UDENYCA) injection 6 mg, 6 mg, Subcutaneous, Once, 1 of 4 cycles Administration: 6 mg (08/15/2017) CARBOplatin (PARAPLATIN) 340 mg in sodium chloride 0.9 % 250 mL chemo infusion, 340 mg (100 % of original dose 340.5 mg), Intravenous,  Once, 1 of 4 cycles Dose modification:   (original dose 340.5 mg, Cycle 1) Administration: 340 mg (08/10/2017) etoposide (VEPESID) 200 mg in sodium chloride 0.9 % 500 mL chemo infusion, 210 mg, Intravenous,  Once, 1 of 4 cycles Administration: 200 mg (08/10/2017), 200 mg (08/11/2017), 200 mg (08/12/2017) fosaprepitant (EMEND) 150 mg, dexamethasone (DECADRON) 12 mg in sodium chloride 0.9 % 145 mL IVPB, , Intravenous,  Once, 1 of 4 cycles Administration:   (08/10/2017) atezolizumab (TECENTRIQ) 1,200 mg in sodium chloride 0.9 % 250 mL chemo infusion, 1,200 mg, Intravenous, Once, 1 of 4 cycles Administration: 1,200 mg (08/10/2017)  for chemotherapy treatment.          INTERVAL HISTORY:  Phillip Sanchez 84 y.o.  male pleasant patient above history of small cell lung cancer with metastasis to the liver is currently status post cycle #1 of chemo immunotherapy 3 weeks ago.  Patient appetite is improving; nausea improving.   He noted to have a small rash on his left foot approximately 10 days ago.  Mild tenderness.  No fever chills.  Review of Systems  Constitutional: Positive for malaise/fatigue and weight loss. Negative for chills, diaphoresis and fever.  HENT: Negative for nosebleeds and sore throat.   Eyes: Negative for double vision.  Respiratory: Negative for cough, hemoptysis, sputum production, shortness of breath and wheezing.   Cardiovascular: Negative for chest pain, palpitations, orthopnea and leg swelling.  Gastrointestinal: Positive for constipation. Negative for abdominal pain, blood in stool, diarrhea, heartburn, melena, nausea and vomiting.  Genitourinary: Negative for dysuria, frequency and urgency.  Musculoskeletal: Negative for back pain and joint pain.  Skin: Positive for rash. Negative for itching.  Neurological: Negative for dizziness, tingling, focal weakness, weakness and headaches.  Endo/Heme/Allergies: Does not bruise/bleed easily.  Psychiatric/Behavioral: Negative for depression. The patient is not nervous/anxious and does not have insomnia.       PAST MEDICAL HISTORY :  Past Medical History:  Diagnosis Date  . Anemia   . CHF (congestive heart failure) ([place])   .  Colon cancer [facility])    He states they removed cancerous polyps.  . Hypercholesteremia   . Hypertension   . Lung cancer ([place])   . Myocardial infarction ([place])   . Osteoarthritis   . Prostate cancer [facility])    Prostatectomy.  . Small cell lung cancer  ([place]) 08/04/2017    PAST SURGICAL HISTORY :   Past Surgical History:  Procedure Laterality Date  . COLON SURGERY    . CORONARY ANGIOPLASTY WITH STENT PLACEMENT    . herniorrhaphy    . PORTA CATH INSERTION N/A 08/05/2017   Procedure: PORTA CATH INSERTION;  Surgeon: Katha Cabal, MD;  Location: [place] [facility];  Service: Cardiovascular;  Laterality: N/A;    FAMILY HISTORY :   Family History  Problem Relation Age of Onset  . Cancer Brother 2       unknown orgin  . Cancer Sister 42       unknown type    SOCIAL HISTORY:   Social History   Tobacco Use  . Smoking status: Former Smoker    Packs/day: 1.00    Years: 36.00    Pack years: 36.00    Types: Cigarettes    Last attempt to quit: 1987    Years since quitting: 32.5  . Smokeless tobacco: Never Used  Substance Use Topics  . Alcohol use: No    Alcohol/week: 0.0 oz  . Drug use: No    ALLERGIES:  has No Known Allergies.  MEDICATIONS:  Current Outpatient Medications  Medication Sig Dispense Refill  . amLODipine-benazepril (LOTREL) 10-40 MG capsule Take 1 capsule by mouth daily.    Marland Kitchen aspirin EC 81 MG tablet Take 81 mg by mouth daily.     Marland Kitchen atorvastatin (LIPITOR) 20 MG tablet Take 20 mg by mouth at bedtime.     . carvedilol (COREG) 25 MG tablet Take 25 mg by mouth 2 (two) times daily.     . cetirizine (ZYRTEC) 10 MG tablet Take 1 tablet by mouth daily.    . clopidogrel (PLAVIX) 75 MG tablet Take 75 mg by mouth daily.     Marland Kitchen esomeprazole (NEXIUM) 20 MG capsule Take 1 capsule (20 mg total) by mouth daily. 90 capsule 1  . fexofenadine-pseudoephedrine (ALLEGRA-D 24) 180-240 MG per 24 hr tablet Take 1 tablet by mouth daily as needed (for allergies).     . fluticasone (FLONASE) 50 MCG/ACT nasal spray Place 1 spray into the nose daily as needed for allergies or rhinitis.     . furosemide (LASIX) 20 MG tablet Take 1 tablet (20 mg total) by mouth daily. 30 tablet 0  . lidocaine-prilocaine (EMLA) cream Apply to affected  area once 30 g 3  . Multiple Vitamin (MULTI-VITAMINS) TABS Take 1 tablet by mouth daily.     . potassium chloride (K-DUR) 10 MEQ tablet Take 1 tablet (10 mEq total) by mouth daily. 30 tablet 0  . ranitidine (ZANTAC) 300 MG tablet Take 300 mg by mouth at bedtime.    Marland Kitchen rOPINIRole (REQUIP) 1 MG tablet Take 1 mg by mouth at bedtime.     . temazepam (RESTORIL) 15 MG capsule Take 15-30 mg by mouth at bedtime as needed for sleep.     Marland Kitchen amoxicillin (AMOXIL) 500 MG capsule Take 1 capsule (500 mg total) by mouth 3 (three) times daily. 21 capsule 0  . calcium carbonate (OS-CAL - DOSED IN MG OF ELEMENTAL CALCIUM) 1250 (500 Ca) MG tablet Take 1 tablet by mouth daily.    Marland Kitchen  docusate sodium (COLACE) 100 MG capsule Take 1 capsule (100 mg total) by mouth daily as needed. (Patient not taking: Reported on 08/31/2017) 30 capsule 2  . dronabinol (MARINOL) 5 MG capsule Take 1 capsule (5 mg total) by mouth 2 (two) times daily before a meal. (Patient not taking: Reported on 08/31/2017) 30 capsule 0  . ondansetron (ZOFRAN) 8 MG tablet Take 1 tablet (8 mg total) by mouth 2 (two) times daily as needed for refractory nausea / vomiting. Start on day 3 after carboplatin chemo. (Patient not taking: Reported on 08/31/2017) 30 tablet 1  . Oxycodone HCl 10 MG TABS Take 1 tablet (10 mg total) by mouth every 6 (six) hours as needed. 30 tablet 0  . polyethylene glycol (MIRALAX / GLYCOLAX) packet Take 17 g by mouth daily. (Patient not taking: Reported on 08/31/2017) 30 each 0  . prochlorperazine (COMPAZINE) 10 MG tablet Take 1 tablet (10 mg total) by mouth every 6 (six) hours as needed for nausea or vomiting. (Patient not taking: Reported on 08/31/2017) 40 tablet 1  . senna (SENOKOT) 8.6 MG TABS tablet Take 1 tablet (8.6 mg total) by mouth daily. (Patient not taking: Reported on 08/31/2017) 120 each 1   No current facility-administered medications for this visit.     PHYSICAL EXAMINATION: ECOG PERFORMANCE STATUS: 2 - Symptomatic, <50%  confined to bed  BP (!) 158/72 (Patient Position: Sitting)   Pulse 88   Temp 98.7 F (37.1 C) (Tympanic)   Resp 20   Ht 5\' 10"  (1.778 m)   Wt 178 lb (80.7 kg)   BMI 25.54 kg/m   Filed Weights   08/31/17 0842  Weight: 178 lb (80.7 kg)    GENERAL: Well-nourished well-developed; Alert, no distress and comfortable.  Accompanied by family.  EYES: no pallor or icterus OROPHARYNX: no thrush or ulceration; NECK: supple; no lymph nodes felt. LYMPH:  no palpable lymphadenopathy in the axillary or inguinal regions LUNGS: Decreased breath sounds auscultation bilaterally. No wheeze or crackles HEART/CVS: regular rate & rhythm and no murmurs; No lower extremity edema ABDOMEN:abdomen soft, non-tender and normal bowel sounds. No hepatomegaly or splenomegaly.  Musculoskeletal:no cyanosis of digits and no clubbing  PSYCH: alert & oriented x 3 with fluent speech NEURO: no focal motor/sensory deficits SKIN:  Left foot dorsal side- "bump"-tender/ erythema    LABORATORY DATA:  I have reviewed the data as listed    Component Value Date/Time   NA 141 08/31/2017 0828   NA 143 01/24/2014 1057   K 3.4 (L) 08/31/2017 0828   K 3.9 01/24/2014 1057   CL 108 08/31/2017 0828   CL 106 01/24/2014 1057   CO2 25 08/31/2017 0828   CO2 27 01/24/2014 1057   GLUCOSE 173 (H) 08/31/2017 0828   GLUCOSE 105 (H) 01/24/2014 1057   BUN 13 08/31/2017 0828   BUN 24 (H) 01/24/2014 1057   CREATININE 1.07 08/31/2017 0828   CREATININE 1.19 01/24/2014 1057   CALCIUM 8.1 (L) 08/31/2017 0828   CALCIUM 8.9 01/24/2014 1057   PROT 6.0 (L) 08/31/2017 0828   PROT 6.8 01/24/2014 1057   ALBUMIN 3.0 (L) 08/31/2017 0828   ALBUMIN 3.5 01/24/2014 1057   AST 40 08/31/2017 0828   AST 19 01/24/2014 1057   ALT 21 08/31/2017 0828   ALT 25 01/24/2014 1057   ALKPHOS 116 08/31/2017 0828   ALKPHOS 73 01/24/2014 1057   BILITOT 0.4 08/31/2017 0828   BILITOT 0.6 01/24/2014 1057   GFRNONAA 60 (L) 08/31/2017 0828   GFRNONAA >60  01/24/2014 1057   GFRNONAA 50 (L) 10/21/2013 0447   GFRAA >60 08/31/2017 0828   GFRAA >60 01/24/2014 1057   GFRAA 58 (L) 10/21/2013 0447    No results found for: SPEP, UPEP  Lab Results  Component Value Date   WBC 22.1 (H) 08/31/2017   NEUTROABS 19.1 (H) 08/31/2017   HGB 10.1 (L) 08/31/2017   HCT 29.1 (L) 08/31/2017   MCV 97.7 08/31/2017   PLT 481 (H) 08/31/2017      Chemistry      Component Value Date/Time   NA 141 08/31/2017 0828   NA 143 01/24/2014 1057   K 3.4 (L) 08/31/2017 0828   K 3.9 01/24/2014 1057   CL 108 08/31/2017 0828   CL 106 01/24/2014 1057   CO2 25 08/31/2017 0828   CO2 27 01/24/2014 1057   BUN 13 08/31/2017 0828   BUN 24 (H) 01/24/2014 1057   CREATININE 1.07 08/31/2017 0828   CREATININE 1.19 01/24/2014 1057      Component Value Date/Time   CALCIUM 8.1 (L) 08/31/2017 0828   CALCIUM 8.9 01/24/2014 1057   ALKPHOS 116 08/31/2017 0828   ALKPHOS 73 01/24/2014 1057   AST 40 08/31/2017 0828   AST 19 01/24/2014 1057   ALT 21 08/31/2017 0828   ALT 25 01/24/2014 1057   BILITOT 0.4 08/31/2017 0828   BILITOT 0.6 01/24/2014 1057       RADIOGRAPHIC STUDIES: I have personally reviewed the radiological images as listed and agreed with the findings in the report. No results found.   ASSESSMENT & PLAN:  [name] #Extensive stage small cell lung cancer-currently status post carboplatin and etoposide plus Tecentriq second #1 approximately 3 weeks ago.  #Clinically improved. HOLD cycle #2 today; "infection of left foot"- C below.  Will push chemotherapy by 1 week.  Discussed that would recommend CT scan after 2 cycles; this could be ordered at next visit.   # Left foot early cellulitis- new; HOLD off chemo today; plan Start amoxicillin x 7 days.   #Bilateral leg swelling- Likely dependent edema; improved; Hold off compression stockings- sec to leg foot cellulitis.   # Brain MRI suggestive of asymptomatic subcentimeter brain  mets; monitor for now.   # follow up in 1 week/MD or NP- carbo-etop- tec; day- 5 neulasta; cbc/bmp.    No orders of the defined types were placed in this encounter.  All questions were answered. The patient knows to call the clinic with any problems, questions or concerns.      Cammie Sickle, MD 08/31/2017 1:14 PM

## 2017-09-01 ENCOUNTER — Inpatient Hospital Stay: Payer: Medicare Other

## 2017-09-02 ENCOUNTER — Inpatient Hospital Stay: Payer: Medicare Other

## 2017-09-05 ENCOUNTER — Other Ambulatory Visit: Payer: Self-pay | Admitting: *Deleted

## 2017-09-05 ENCOUNTER — Ambulatory Visit: Payer: Medicare Other

## 2017-09-05 MED ORDER — OXYCODONE HCL 10 MG PO TABS: 10.0000 mg | ORAL_TABLET | Freq: Four times a day (QID) | ORAL | 0 refills | Status: DC | PRN

## 2017-09-07 ENCOUNTER — Inpatient Hospital Stay: Payer: Medicare Other

## 2017-09-07 ENCOUNTER — Inpatient Hospital Stay (HOSPITAL_BASED_OUTPATIENT_CLINIC_OR_DEPARTMENT_OTHER): Payer: Medicare Other | Admitting: Internal Medicine

## 2017-09-07 VITALS — BP 171/68 | HR 92 | Temp 97.8°F | Resp 16 | Wt 180.8 lb

## 2017-09-07 DIAGNOSIS — L03116 Cellulitis of left lower limb: Secondary | ICD-10-CM | POA: Diagnosis not present

## 2017-09-07 DIAGNOSIS — I10 Essential (primary) hypertension: Secondary | ICD-10-CM

## 2017-09-07 DIAGNOSIS — C3490 Malignant neoplasm of unspecified part of unspecified bronchus or lung: Secondary | ICD-10-CM

## 2017-09-07 DIAGNOSIS — C787 Secondary malignant neoplasm of liver and intrahepatic bile duct: Secondary | ICD-10-CM | POA: Diagnosis not present

## 2017-09-07 DIAGNOSIS — C3432 Malignant neoplasm of lower lobe, left bronchus or lung: Secondary | ICD-10-CM

## 2017-09-07 DIAGNOSIS — Z85038 Personal history of other malignant neoplasm of large intestine: Secondary | ICD-10-CM

## 2017-09-07 DIAGNOSIS — R0602 Shortness of breath: Secondary | ICD-10-CM

## 2017-09-07 DIAGNOSIS — R600 Localized edema: Secondary | ICD-10-CM

## 2017-09-07 DIAGNOSIS — Z8546 Personal history of malignant neoplasm of prostate: Secondary | ICD-10-CM

## 2017-09-07 DIAGNOSIS — Z87891 Personal history of nicotine dependence: Secondary | ICD-10-CM

## 2017-09-07 LAB — CBC WITH DIFFERENTIAL/PLATELET
BASOS ABS: 0 10*3/uL (ref 0–0.1)
BASOS PCT: 0 %
EOS ABS: 0.1 10*3/uL (ref 0–0.7)
Eosinophils Relative: 1 %
HEMATOCRIT: 32.4 % — AB (ref 40.0–52.0)
HEMOGLOBIN: 11.2 g/dL — AB (ref 13.0–18.0)
Lymphocytes Relative: 8 %
Lymphs Abs: 1.1 10*3/uL (ref 1.0–3.6)
MCH: 34 pg (ref 26.0–34.0)
MCHC: 34.4 g/dL (ref 32.0–36.0)
MCV: 98.8 fL (ref 80.0–100.0)
MONOS PCT: 10 %
Monocytes Absolute: 1.3 10*3/uL — ABNORMAL HIGH (ref 0.2–1.0)
NEUTROS PCT: 81 %
Neutro Abs: 11 10*3/uL — ABNORMAL HIGH (ref 1.4–6.5)
Platelets: 567 10*3/uL — ABNORMAL HIGH (ref 150–440)
RBC: 3.28 MIL/uL — AB (ref 4.40–5.90)
RDW: 14.8 % — ABNORMAL HIGH (ref 11.5–14.5)
WBC: 13.5 10*3/uL — AB (ref 3.8–10.6)

## 2017-09-07 LAB — COMPREHENSIVE METABOLIC PANEL
ALK PHOS: 101 U/L (ref 38–126)
ALT: 18 U/L (ref 0–44)
AST: 39 U/L (ref 15–41)
Albumin: 3.1 g/dL — ABNORMAL LOW (ref 3.5–5.0)
Anion gap: 10 (ref 5–15)
BUN: 12 mg/dL (ref 8–23)
CALCIUM: 8.5 mg/dL — AB (ref 8.9–10.3)
CO2: 23 mmol/L (ref 22–32)
CREATININE: 0.99 mg/dL (ref 0.61–1.24)
Chloride: 105 mmol/L (ref 98–111)
Glucose, Bld: 174 mg/dL — ABNORMAL HIGH (ref 70–99)
Potassium: 3.8 mmol/L (ref 3.5–5.1)
Sodium: 138 mmol/L (ref 135–145)
Total Bilirubin: 0.6 mg/dL (ref 0.3–1.2)
Total Protein: 6.3 g/dL — ABNORMAL LOW (ref 6.5–8.1)

## 2017-09-07 LAB — TSH: TSH: 0.773 u[IU]/mL (ref 0.350–4.500)

## 2017-09-07 LAB — BRAIN NATRIURETIC PEPTIDE: B NATRIURETIC PEPTIDE 5: 362 pg/mL — AB (ref 0.0–100.0)

## 2017-09-07 MED ORDER — SODIUM CHLORIDE 0.9 % IV SOLN
1200.0000 mg | Freq: Once | INTRAVENOUS | Status: AC
  Administered 2017-09-07: 1200 mg via INTRAVENOUS
  Filled 2017-09-07: qty 20

## 2017-09-07 MED ORDER — SODIUM CHLORIDE 0.9 % IV SOLN
Freq: Once | INTRAVENOUS | Status: AC
  Administered 2017-09-07: 11:00:00 via INTRAVENOUS
  Filled 2017-09-07: qty 1000

## 2017-09-07 MED ORDER — PALONOSETRON HCL INJECTION 0.25 MG/5ML
0.2500 mg | Freq: Once | INTRAVENOUS | Status: AC
  Administered 2017-09-07: 0.25 mg via INTRAVENOUS
  Filled 2017-09-07: qty 5

## 2017-09-07 MED ORDER — HEPARIN SOD (PORK) LOCK FLUSH 100 UNIT/ML IV SOLN
500.0000 [IU] | Freq: Once | INTRAVENOUS | Status: AC | PRN
  Administered 2017-09-07: 500 [IU]

## 2017-09-07 MED ORDER — SODIUM CHLORIDE 0.9 % IV SOLN
Freq: Once | INTRAVENOUS | Status: AC
  Administered 2017-09-07: 11:00:00 via INTRAVENOUS
  Filled 2017-09-07: qty 5

## 2017-09-07 MED ORDER — FLUTICASONE-SALMETEROL 500-50 MCG/DOSE IN AEPB: 1.0000 | INHALATION_SPRAY | Freq: Two times a day (BID) | RESPIRATORY_TRACT | 3 refills | Status: DC

## 2017-09-07 MED ORDER — ALBUTEROL SULFATE (2.5 MG/3ML) 0.083% IN NEBU
2.5000 mg | INHALATION_SOLUTION | Freq: Once | RESPIRATORY_TRACT | Status: AC
  Administered 2017-09-07: 2.5 mg via RESPIRATORY_TRACT
  Filled 2017-09-07: qty 3

## 2017-09-07 MED ORDER — SODIUM CHLORIDE 0.9 % IV SOLN
431.0000 mg | Freq: Once | INTRAVENOUS | Status: AC
  Administered 2017-09-07: 430 mg via INTRAVENOUS
  Filled 2017-09-07: qty 43

## 2017-09-07 MED ORDER — ETOPOSIDE CHEMO INJECTION 1 GM/50ML
200.0000 mg | Freq: Once | INTRAVENOUS | Status: AC
  Administered 2017-09-07: 200 mg via INTRAVENOUS
  Filled 2017-09-07: qty 10

## 2017-09-07 MED ORDER — ALBUTEROL SULFATE 108 (90 BASE) MCG/ACT IN AEPB: 1.0000 | INHALATION_SPRAY | RESPIRATORY_TRACT | 3 refills | Status: DC | PRN

## 2017-09-07 NOTE — Progress Notes (Signed)
Small cell lung cancer is here [facility] [place] OFFICE PROGRESS NOTE  Patient Care Team: Phillip Crouch, MD as PCP - General (Internal Medicine) Phillip Nab, RN as Registered Nurse Fath, Javier Docker, MD as Consulting Physician (Cardiology) Phillip Sickle, MD as Medical Oncologist (Medical Oncology)  Cancer Staging No matching staging information was found for the patient.   Oncology History   # 2015- L[facility] [place] ca stage I s/p SBRT [Phillip Sanchez]  # 3rd June 2019- Multiple liver lesions- s/p Bx- SMALL CELL of lung origin; STAGE IV;  # June 26th- carbo-etop-atezo  # Prostate cancer [ 43 years ago]; s/p surgery  # colon cancer [s/p resection of malignant polyps; Phillip Sanchez; No colectomy]  # hx CAD [asprin/plavix]; hx PVD  -----------------------------------------------   DIAGNOSIS: Small cell lung cancer  STAGE:  IV     ;GOALS: Palliative  CURRENT/MOST RECENT THERAPY: carbo-etop-Atezo      [name]    [name]   08/04/2017 Initial Diagnosis    [name]      08/04/2017 -  Chemotherapy    The patient had palonosetron (ALOXI) injection 0.25 mg, 0.25 mg, Intravenous,  Once, 2 of 4 cycles Administration: 0.25 mg (08/10/2017), 0.25 mg (09/07/2017) pegfilgrastim-cbqv (UDENYCA) injection 6 mg, 6 mg, Subcutaneous, Once, 2 of 4 cycles Administration: 6 mg (08/15/2017) CARBOplatin (PARAPLATIN) 340 mg in sodium chloride 0.9 % 250 mL chemo infusion, 340 mg (100 % of original dose 340.5 mg), Intravenous,  Once, 2 of 4 cycles Dose modification:   (original dose 340.5 mg, Cycle 1),   (original dose 431 mg, Cycle 2) Administration: 340 mg (08/10/2017), 430 mg (09/07/2017) etoposide (VEPESID) 200 mg in sodium chloride 0.9 % 500 mL chemo infusion, 210 mg, Intravenous,  Once, 2 of 4 cycles Administration: 200 mg (08/10/2017), 200 mg (08/11/2017), 200 mg (08/12/2017), 200 mg (09/07/2017), 200 mg  (09/08/2017) fosaprepitant (EMEND) 150 mg, dexamethasone (DECADRON) 12 mg in sodium chloride 0.9 % 145 mL IVPB, , Intravenous,  Once, 2 of 4 cycles Administration:  (08/10/2017),  (09/07/2017) atezolizumab (TECENTRIQ) 1,200 mg in sodium chloride 0.9 % 250 mL chemo infusion, 1,200 mg, Intravenous, Once, 2 of 4 cycles Administration: 1,200 mg (08/10/2017), 1,200 mg (09/07/2017)  for chemotherapy treatment.          INTERVAL HISTORY:  Phillip Sanchez 84 y.o.  male pleasant patient above history of extensive stage small cell lung cancer is here for follow-up.    Patient cycle #2 chemotherapy was held approximately 1 week ago because of left foot sialitis.  Patient was treated with antibiotics.  Complains of mild shortness of breath cough.  No hemoptysis.  No sputum.  No fevers.  Appetite good otherwise.  Leg swelling improved.  Review of Systems  Constitutional: Positive for malaise/fatigue. Negative for chills, diaphoresis, fever and weight loss.  HENT: Negative for nosebleeds and sore throat.   Eyes: Negative for double vision.  Respiratory: Positive for cough, sputum production and shortness of breath. Negative for hemoptysis and wheezing.   Cardiovascular: Negative for chest pain, palpitations, orthopnea and leg swelling.  Gastrointestinal: Negative for abdominal pain, blood in stool, constipation, diarrhea, heartburn, melena, nausea and vomiting.  Genitourinary: Negative for dysuria, frequency and urgency.  Musculoskeletal: Negative for back pain and joint pain.  Skin: Negative.  Negative for itching and rash.  Neurological: Negative for dizziness, tingling, focal weakness, weakness and headaches.  Endo/Heme/Allergies: Does not bruise/bleed easily.  Psychiatric/Behavioral: Negative for  depression. The patient is not nervous/anxious and does not have insomnia.       PAST MEDICAL HISTORY :  Past Medical History:  Diagnosis Date  . Anemia   . CHF (congestive heart failure) ([place])   .  Colon cancer [facility])    He states they removed cancerous polyps.  . Hypercholesteremia   . Hypertension   . Lung cancer ([place])   . Myocardial infarction ([place])   . Osteoarthritis   . Prostate cancer [facility])    Prostatectomy.  . [name] 08/04/2017    PAST SURGICAL HISTORY :   Past Surgical History:  Procedure Laterality Date  . COLON SURGERY    . CORONARY ANGIOPLASTY WITH STENT PLACEMENT    . herniorrhaphy    . PORTA CATH INSERTION N/A 08/05/2017   Procedure: PORTA CATH INSERTION;  Surgeon: Phillip Cabal, MD;  Location: [place] [facility];  Service: Cardiovascular;  Laterality: N/A;    FAMILY HISTORY :   Family History  Problem Relation Age of Onset  . Cancer Brother 59       unknown orgin  . Cancer Sister 20       unknown type    SOCIAL HISTORY:   Social History   Tobacco Use  . Smoking status: Former Smoker    Packs/day: 1.00    Years: 36.00    Pack years: 36.00    Types: Cigarettes    Last attempt to quit: 1987    Years since quitting: 32.5  . Smokeless tobacco: Never Used  Substance Use Topics  . Alcohol use: No    Alcohol/week: 0.0 oz  . Drug use: No    ALLERGIES:  has No Known Allergies.  MEDICATIONS:  Current Outpatient Medications  Medication Sig Dispense Refill  . amLODipine-benazepril (LOTREL) 10-40 MG capsule Take 1 capsule by mouth daily.    Marland Kitchen amoxicillin (AMOXIL) 500 MG capsule Take 1 capsule (500 mg total) by mouth 3 (three) times daily. 21 capsule 0  . aspirin EC 81 MG tablet Take 81 mg by mouth daily.     Marland Kitchen atorvastatin (LIPITOR) 20 MG tablet Take 20 mg by mouth at bedtime.     . calcium carbonate (OS-CAL - DOSED IN MG OF ELEMENTAL CALCIUM) 1250 (500 Ca) MG tablet Take 1 tablet by mouth daily.    . carvedilol (COREG) 25 MG tablet Take 25 mg by mouth 2 (two) times daily.     . cetirizine (ZYRTEC) 10 MG tablet Take 1 tablet by mouth daily.    . clopidogrel (PLAVIX) 75 MG tablet Take 75 mg by mouth daily.     Marland Kitchen docusate  sodium (COLACE) 100 MG capsule Take 1 capsule (100 mg total) by mouth daily as needed. 30 capsule 2  . dronabinol (MARINOL) 5 MG capsule Take 1 capsule (5 mg total) by mouth 2 (two) times daily before a meal. 30 capsule 0  . esomeprazole (NEXIUM) 20 MG capsule Take 1 capsule (20 mg total) by mouth daily. 90 capsule 1  . fexofenadine-pseudoephedrine (ALLEGRA-D 24) 180-240 MG per 24 hr tablet Take 1 tablet by mouth daily as needed (for allergies).     . fluticasone (FLONASE) 50 MCG/ACT nasal spray Place 1 spray into the nose daily as needed for allergies or rhinitis.     . furosemide (LASIX) 20 MG tablet Take 1 tablet (20 mg total) by mouth daily. 30 tablet 0  . lidocaine-prilocaine (EMLA) cream Apply to affected area once 30 g 3  .  Multiple Vitamin (MULTI-VITAMINS) TABS Take 1 tablet by mouth daily.     . ondansetron (ZOFRAN) 8 MG tablet Take 1 tablet (8 mg total) by mouth 2 (two) times daily as needed for refractory nausea / vomiting. Start on day 3 after carboplatin chemo. 30 tablet 1  . Oxycodone HCl 10 MG TABS Take 1 tablet (10 mg total) by mouth every 6 (six) hours as needed. 30 tablet 0  . polyethylene glycol (MIRALAX / GLYCOLAX) packet Take 17 g by mouth daily. 30 each 0  . potassium chloride (K-DUR) 10 MEQ tablet Take 1 tablet (10 mEq total) by mouth daily. 30 tablet 0  . prochlorperazine (COMPAZINE) 10 MG tablet Take 1 tablet (10 mg total) by mouth every 6 (six) hours as needed for nausea or vomiting. 40 tablet 1  . ranitidine (ZANTAC) 300 MG tablet Take 300 mg by mouth at bedtime.    Marland Kitchen rOPINIRole (REQUIP) 1 MG tablet Take 1 mg by mouth at bedtime.     . senna (SENOKOT) 8.6 MG TABS tablet Take 1 tablet (8.6 mg total) by mouth daily. 120 each 1  . temazepam (RESTORIL) 15 MG capsule Take 15-30 mg by mouth at bedtime as needed for sleep.     . Albuterol Sulfate (PROAIR RESPICLICK) 989 (90 Base) MCG/ACT AEPB Inhale 1 puff into the lungs every 4 (four) hours as needed. 1 each 3  .  Fluticasone-Salmeterol (ADVAIR DISKUS) 500-50 MCG/DOSE AEPB Inhale 1 puff into the lungs 2 (two) times daily. 1 each 3   No current facility-administered medications for this visit.     PHYSICAL EXAMINATION: ECOG PERFORMANCE STATUS: 1 - Symptomatic but completely ambulatory  BP (!) 171/68 (BP Location: Left Arm, Patient Position: Sitting)   Pulse 92   Temp 97.8 F (36.6 C) (Tympanic)   Resp 16   Wt 180 lb 12.8 oz (82 kg)   SpO2 94%   BMI 25.94 kg/m   Filed Weights   09/07/17 0922  Weight: 180 lb 12.8 oz (82 kg)    GENERAL: Well-nourished well-developed; Alert, no distress and comfortable.  Accompanied by son. EYES: no pallor or icterus OROPHARYNX: no thrush or ulceration; NECK: supple; no lymph nodes felt. LYMPH:  no palpable lymphadenopathy in the axillary or inguinal regions LUNGS: Decreased breath sounds auscultation bilaterally. No wheeze or crackles HEART/CVS: regular rate & rhythm and no murmurs; No lower extremity edema ABDOMEN:abdomen soft, non-tender and normal bowel sounds. No hepatomegaly or splenomegaly.  Musculoskeletal:no cyanosis of digits and no clubbing  PSYCH: alert & oriented x 3 with fluent speech NEURO: no focal motor/sensory deficits SKIN: Lower extremity/left rash improved.    LABORATORY DATA:  I have reviewed the data as listed    Component Value Date/Time   NA 138 09/07/2017 0905   NA 143 01/24/2014 1057   K 3.8 09/07/2017 0905   K 3.9 01/24/2014 1057   CL 105 09/07/2017 0905   CL 106 01/24/2014 1057   CO2 23 09/07/2017 0905   CO2 27 01/24/2014 1057   GLUCOSE 174 (H) 09/07/2017 0905   GLUCOSE 105 (H) 01/24/2014 1057   BUN 12 09/07/2017 0905   BUN 24 (H) 01/24/2014 1057   CREATININE 0.99 09/07/2017 0905   CREATININE 1.19 01/24/2014 1057   CALCIUM 8.5 (L) 09/07/2017 0905   CALCIUM 8.9 01/24/2014 1057   PROT 6.3 (L) 09/07/2017 0905   PROT 6.8 01/24/2014 1057   ALBUMIN 3.1 (L) 09/07/2017 0905   ALBUMIN 3.5 01/24/2014 1057   AST 39  09/07/2017 0905  AST 19 01/24/2014 1057   ALT 18 09/07/2017 0905   ALT 25 01/24/2014 1057   ALKPHOS 101 09/07/2017 0905   ALKPHOS 73 01/24/2014 1057   BILITOT 0.6 09/07/2017 0905   BILITOT 0.6 01/24/2014 1057   GFRNONAA >60 09/07/2017 0905   GFRNONAA >60 01/24/2014 1057   GFRNONAA 50 (L) 10/21/2013 0447   GFRAA >60 09/07/2017 0905   GFRAA >60 01/24/2014 1057   GFRAA 58 (L) 10/21/2013 0447    No results found for: SPEP, UPEP  Lab Results  Component Value Date   WBC 13.5 (H) 09/07/2017   NEUTROABS 11.0 (H) 09/07/2017   HGB 11.2 (L) 09/07/2017   HCT 32.4 (L) 09/07/2017   MCV 98.8 09/07/2017   PLT 567 (H) 09/07/2017      Chemistry      Component Value Date/Time   NA 138 09/07/2017 0905   NA 143 01/24/2014 1057   K 3.8 09/07/2017 0905   K 3.9 01/24/2014 1057   CL 105 09/07/2017 0905   CL 106 01/24/2014 1057   CO2 23 09/07/2017 0905   CO2 27 01/24/2014 1057   BUN 12 09/07/2017 0905   BUN 24 (H) 01/24/2014 1057   CREATININE 0.99 09/07/2017 0905   CREATININE 1.19 01/24/2014 1057      Component Value Date/Time   CALCIUM 8.5 (L) 09/07/2017 0905   CALCIUM 8.9 01/24/2014 1057   ALKPHOS 101 09/07/2017 0905   ALKPHOS 73 01/24/2014 1057   AST 39 09/07/2017 0905   AST 19 01/24/2014 1057   ALT 18 09/07/2017 0905   ALT 25 01/24/2014 1057   BILITOT 0.6 09/07/2017 0905   BILITOT 0.6 01/24/2014 1057       RADIOGRAPHIC STUDIES: I have personally reviewed the radiological images as listed and agreed with the findings in the report. No results found.   ASSESSMENT & PLAN:  [name] #Extensive stage small cell lung cancer-currently status post carboplatin and etoposide plus Tecentriq second #1 approximately 4 weeks ago.  #Clinically improved. Proceed with cycle #2 today. Labs today reviewed;  acceptable for treatment today.   # Left foot early cellulitis-resolved s/p amoxicillin x 7 days.   # SOB; no cough; on lasix 20 BID x1 week; add  adviar/albuterol; if not improved to call then- add CXR.   #Bilateral leg swelling- Likely dependent edema; improved;  # Brain MRI suggestive of asymptomatic subcentimeter brain mets; STABLE.   # follow up in 10 days; [facility]; in 3 weeks-labs/carbo-etop-Tec; add BNP   Orders Placed This Encounter  Procedures  . Brain natriuretic peptide    Standing Status:   Future    Number of Occurrences:   1    Standing Expiration Date:   09/08/2018  . CBC with Differential/Platelet    Standing Status:   Future    Standing Expiration Date:   09/08/2018  . Comprehensive metabolic panel    Standing Status:   Future    Standing Expiration Date:   09/08/2018   All questions were answered. The patient knows to call the clinic with any problems, questions or concerns.      Phillip Sickle, MD 09/09/2017 8:09 AM

## 2017-09-07 NOTE — Patient Instructions (Signed)
#   Take lasix 20 mg BID x 1 week  #

## 2017-09-07 NOTE — Assessment & Plan Note (Addendum)
#  Extensive stage small cell lung cancer-currently status post carboplatin and etoposide plus Tecentriq second #1 approximately 4 weeks ago.  #Clinically improved. Proceed with cycle #2 today. Labs today reviewed;  acceptable for treatment today.   # Left foot early cellulitis-resolved s/p amoxicillin x 7 days.   # SOB; no cough; on lasix 20 BID x1 week; add adviar/albuterol; if not improved to call then- add CXR.   #Bilateral leg swelling- Likely dependent edema; improved;  # Brain MRI suggestive of asymptomatic subcentimeter brain mets; STABLE.   # follow up in 10 days; [facility]; in 3 weeks-labs/carbo-etop-Tec; add BNP

## 2017-09-08 ENCOUNTER — Inpatient Hospital Stay: Payer: Medicare Other

## 2017-09-08 VITALS — BP 161/71 | HR 81 | Resp 20

## 2017-09-08 DIAGNOSIS — C3490 Malignant neoplasm of unspecified part of unspecified bronchus or lung: Secondary | ICD-10-CM

## 2017-09-08 MED ORDER — SODIUM CHLORIDE 0.9 % IV SOLN
Freq: Once | INTRAVENOUS | Status: AC
  Administered 2017-09-08: 14:00:00 via INTRAVENOUS
  Filled 2017-09-08: qty 1000

## 2017-09-08 MED ORDER — SODIUM CHLORIDE 0.9% FLUSH
10.0000 mL | INTRAVENOUS | Status: DC | PRN
  Administered 2017-09-08: 10 mL
  Filled 2017-09-08: qty 10

## 2017-09-08 MED ORDER — HEPARIN SOD (PORK) LOCK FLUSH 100 UNIT/ML IV SOLN
500.0000 [IU] | Freq: Once | INTRAVENOUS | Status: AC | PRN
  Administered 2017-09-08: 500 [IU]
  Filled 2017-09-08: qty 5

## 2017-09-08 MED ORDER — SODIUM CHLORIDE 0.9 % IV SOLN
200.0000 mg | Freq: Once | INTRAVENOUS | Status: AC
  Administered 2017-09-08: 200 mg via INTRAVENOUS
  Filled 2017-09-08: qty 10

## 2017-09-08 MED ORDER — DEXAMETHASONE SODIUM PHOSPHATE 10 MG/ML IJ SOLN
10.0000 mg | Freq: Once | INTRAMUSCULAR | Status: AC
  Administered 2017-09-08: 10 mg via INTRAVENOUS
  Filled 2017-09-08: qty 1

## 2017-09-08 MED ORDER — SODIUM CHLORIDE 0.9 % IV SOLN: 10.0000 mg | Freq: Once | INTRAVENOUS | Status: DC

## 2017-09-09 ENCOUNTER — Inpatient Hospital Stay
Admission: EM | Admit: 2017-09-09 | Discharge: 2017-09-11 | DRG: 291 | Disposition: A | Discharge status: Home / Self Care | Payer: Medicare Other | Attending: Internal Medicine | Admitting: Internal Medicine

## 2017-09-09 ENCOUNTER — Other Ambulatory Visit: Payer: Self-pay

## 2017-09-09 ENCOUNTER — Inpatient Hospital Stay: Payer: Medicare Other

## 2017-09-09 ENCOUNTER — Emergency Department: Payer: Medicare Other

## 2017-09-09 DIAGNOSIS — Z8546 Personal history of malignant neoplasm of prostate: Secondary | ICD-10-CM

## 2017-09-09 DIAGNOSIS — Z87891 Personal history of nicotine dependence: Secondary | ICD-10-CM

## 2017-09-09 DIAGNOSIS — C787 Secondary malignant neoplasm of liver and intrahepatic bile duct: Secondary | ICD-10-CM | POA: Diagnosis present

## 2017-09-09 DIAGNOSIS — M1990 Unspecified osteoarthritis, unspecified site: Secondary | ICD-10-CM | POA: Diagnosis present

## 2017-09-09 DIAGNOSIS — Z9221 Personal history of antineoplastic chemotherapy: Secondary | ICD-10-CM | POA: Diagnosis not present

## 2017-09-09 DIAGNOSIS — E785 Hyperlipidemia, unspecified: Secondary | ICD-10-CM | POA: Diagnosis present

## 2017-09-09 DIAGNOSIS — Z85038 Personal history of other malignant neoplasm of large intestine: Secondary | ICD-10-CM | POA: Diagnosis not present

## 2017-09-09 DIAGNOSIS — I110 Hypertensive heart disease with heart failure: Secondary | ICD-10-CM | POA: Diagnosis present

## 2017-09-09 DIAGNOSIS — Z7902 Long term (current) use of antithrombotics/antiplatelets: Secondary | ICD-10-CM | POA: Diagnosis not present

## 2017-09-09 DIAGNOSIS — I5031 Acute diastolic (congestive) heart failure: Secondary | ICD-10-CM | POA: Diagnosis not present

## 2017-09-09 DIAGNOSIS — I5033 Acute on chronic diastolic (congestive) heart failure: Secondary | ICD-10-CM | POA: Diagnosis present

## 2017-09-09 DIAGNOSIS — C3490 Malignant neoplasm of unspecified part of unspecified bronchus or lung: Secondary | ICD-10-CM | POA: Diagnosis present

## 2017-09-09 DIAGNOSIS — J810 Acute pulmonary edema: Secondary | ICD-10-CM | POA: Diagnosis present

## 2017-09-09 DIAGNOSIS — J9811 Atelectasis: Secondary | ICD-10-CM | POA: Diagnosis present

## 2017-09-09 DIAGNOSIS — I447 Left bundle-branch block, unspecified: Secondary | ICD-10-CM | POA: Diagnosis present

## 2017-09-09 DIAGNOSIS — Z955 Presence of coronary angioplasty implant and graft: Secondary | ICD-10-CM

## 2017-09-09 DIAGNOSIS — C7931 Secondary malignant neoplasm of brain: Secondary | ICD-10-CM | POA: Diagnosis present

## 2017-09-09 DIAGNOSIS — I509 Heart failure, unspecified: Secondary | ICD-10-CM

## 2017-09-09 DIAGNOSIS — I2510 Atherosclerotic heart disease of native coronary artery without angina pectoris: Secondary | ICD-10-CM | POA: Diagnosis present

## 2017-09-09 DIAGNOSIS — Z7982 Long term (current) use of aspirin: Secondary | ICD-10-CM

## 2017-09-09 DIAGNOSIS — J9600 Acute respiratory failure, unspecified whether with hypoxia or hypercapnia: Secondary | ICD-10-CM

## 2017-09-09 DIAGNOSIS — E7800 Pure hypercholesterolemia, unspecified: Secondary | ICD-10-CM | POA: Diagnosis present

## 2017-09-09 DIAGNOSIS — J9601 Acute respiratory failure with hypoxia: Secondary | ICD-10-CM | POA: Diagnosis present

## 2017-09-09 DIAGNOSIS — I252 Old myocardial infarction: Secondary | ICD-10-CM

## 2017-09-09 DIAGNOSIS — Z66 Do not resuscitate: Secondary | ICD-10-CM | POA: Diagnosis present

## 2017-09-09 LAB — CBC
HEMATOCRIT: 36.6 % — AB (ref 40.0–52.0)
HEMOGLOBIN: 12.4 g/dL — AB (ref 13.0–18.0)
MCH: 34 pg (ref 26.0–34.0)
MCHC: 33.9 g/dL (ref 32.0–36.0)
MCV: 100.3 fL — ABNORMAL HIGH (ref 80.0–100.0)
Platelets: 654 10*3/uL — ABNORMAL HIGH (ref 150–440)
RBC: 3.65 MIL/uL — ABNORMAL LOW (ref 4.40–5.90)
RDW: 14.9 % — ABNORMAL HIGH (ref 11.5–14.5)
WBC: 22.6 10*3/uL — AB (ref 3.8–10.6)

## 2017-09-09 LAB — COMPREHENSIVE METABOLIC PANEL
ALBUMIN: 3.4 g/dL — AB (ref 3.5–5.0)
ALK PHOS: 101 U/L (ref 38–126)
ALT: 22 U/L (ref 0–44)
AST: 61 U/L — AB (ref 15–41)
Anion gap: 9 (ref 5–15)
BILIRUBIN TOTAL: 1 mg/dL (ref 0.3–1.2)
BUN: 23 mg/dL (ref 8–23)
CALCIUM: 8.4 mg/dL — AB (ref 8.9–10.3)
CO2: 26 mmol/L (ref 22–32)
Chloride: 105 mmol/L (ref 98–111)
Creatinine, Ser: 1.14 mg/dL (ref 0.61–1.24)
GFR calc Af Amer: >60 mL/min (ref >60)
GFR calc non Af Amer: 55 mL/min — ABNORMAL LOW (ref >60)
GLUCOSE: 191 mg/dL — AB (ref 70–99)
Potassium: 4.6 mmol/L (ref 3.5–5.1)
Sodium: 140 mmol/L (ref 135–145)
TOTAL PROTEIN: 7.3 g/dL (ref 6.5–8.1)

## 2017-09-09 LAB — GLUCOSE, CAPILLARY: Glucose-Capillary: 126 mg/dL — ABNORMAL HIGH (ref 70–99)

## 2017-09-09 LAB — BRAIN NATRIURETIC PEPTIDE: B Natriuretic Peptide: 458 pg/mL — ABNORMAL HIGH (ref 0.0–100.0)

## 2017-09-09 LAB — MRSA PCR SCREENING: MRSA by PCR: NEGATIVE

## 2017-09-09 LAB — TROPONIN I: Troponin I: 0.03 ng/mL (ref <0.03)

## 2017-09-09 MED ORDER — FUROSEMIDE 10 MG/ML IJ SOLN
INTRAMUSCULAR | Status: AC
  Administered 2017-09-09: 20 mg via INTRAVENOUS
  Filled 2017-09-09: qty 4

## 2017-09-09 MED ORDER — AMLODIPINE BESY-BENAZEPRIL HCL 10-40 MG PO CAPS: 1.0000 | ORAL_CAPSULE | Freq: Every day | ORAL | Status: DC

## 2017-09-09 MED ORDER — CLOPIDOGREL BISULFATE 75 MG PO TABS
75.0000 mg | ORAL_TABLET | Freq: Every day | ORAL | Status: DC
  Administered 2017-09-09 – 2017-09-11 (×3): 75 mg via ORAL
  Filled 2017-09-09 (×3): qty 1

## 2017-09-09 MED ORDER — DOCUSATE SODIUM 100 MG PO CAPS: 100.0000 mg | ORAL_CAPSULE | Freq: Every day | ORAL | Status: DC | PRN

## 2017-09-09 MED ORDER — FLUTICASONE PROPIONATE 50 MCG/ACT NA SUSP
1.0000 | Freq: Every day | NASAL | Status: DC | PRN
  Filled 2017-09-09: qty 16

## 2017-09-09 MED ORDER — DOCUSATE SODIUM 100 MG PO CAPS: 100.0000 mg | ORAL_CAPSULE | Freq: Two times a day (BID) | ORAL | Status: DC | PRN

## 2017-09-09 MED ORDER — MOMETASONE FURO-FORMOTEROL FUM 200-5 MCG/ACT IN AERO
2.0000 | INHALATION_SPRAY | Freq: Two times a day (BID) | RESPIRATORY_TRACT | Status: DC
  Administered 2017-09-09 – 2017-09-11 (×4): 2 via RESPIRATORY_TRACT
  Filled 2017-09-09: qty 8.8

## 2017-09-09 MED ORDER — POLYETHYLENE GLYCOL 3350 17 G PO PACK
17.0000 g | PACK | Freq: Every day | ORAL | Status: DC
  Administered 2017-09-10: 17 g via ORAL
  Filled 2017-09-09: qty 1

## 2017-09-09 MED ORDER — FUROSEMIDE 10 MG/ML IJ SOLN
60.0000 mg | Freq: Once | INTRAMUSCULAR | Status: AC
  Administered 2017-09-09: 60 mg via INTRAVENOUS
  Filled 2017-09-09: qty 8

## 2017-09-09 MED ORDER — PROCHLORPERAZINE MALEATE 10 MG PO TABS
10.0000 mg | ORAL_TABLET | Freq: Four times a day (QID) | ORAL | Status: DC | PRN
  Filled 2017-09-09: qty 1

## 2017-09-09 MED ORDER — CARVEDILOL 25 MG PO TABS
25.0000 mg | ORAL_TABLET | Freq: Two times a day (BID) | ORAL | Status: DC
  Administered 2017-09-09 – 2017-09-11 (×4): 25 mg via ORAL
  Filled 2017-09-09: qty 2
  Filled 2017-09-09: qty 1
  Filled 2017-09-09: qty 2
  Filled 2017-09-09: qty 1

## 2017-09-09 MED ORDER — PANTOPRAZOLE SODIUM 40 MG PO TBEC
40.0000 mg | DELAYED_RELEASE_TABLET | Freq: Every day | ORAL | Status: DC
  Administered 2017-09-09 – 2017-09-11 (×3): 40 mg via ORAL
  Filled 2017-09-09 (×3): qty 1

## 2017-09-09 MED ORDER — BENAZEPRIL HCL 20 MG PO TABS
40.0000 mg | ORAL_TABLET | Freq: Every day | ORAL | Status: DC
  Administered 2017-09-09 – 2017-09-11 (×3): 40 mg via ORAL
  Filled 2017-09-09 (×3): qty 2

## 2017-09-09 MED ORDER — HEPARIN SODIUM (PORCINE) 5000 UNIT/ML IJ SOLN
5000.0000 [IU] | Freq: Three times a day (TID) | INTRAMUSCULAR | Status: DC
  Administered 2017-09-09 – 2017-09-11 (×5): 5000 [IU] via SUBCUTANEOUS
  Filled 2017-09-09 (×5): qty 1

## 2017-09-09 MED ORDER — AMLODIPINE BESYLATE 10 MG PO TABS
10.0000 mg | ORAL_TABLET | Freq: Every day | ORAL | Status: DC
  Administered 2017-09-09 – 2017-09-11 (×3): 10 mg via ORAL
  Filled 2017-09-09 (×2): qty 2
  Filled 2017-09-09: qty 1

## 2017-09-09 MED ORDER — HYDRALAZINE HCL 20 MG/ML IJ SOLN
10.0000 mg | Freq: Once | INTRAMUSCULAR | Status: AC
  Administered 2017-09-09: 10 mg via INTRAVENOUS
  Filled 2017-09-09: qty 1

## 2017-09-09 MED ORDER — FUROSEMIDE 10 MG/ML IJ SOLN
20.0000 mg | Freq: Three times a day (TID) | INTRAMUSCULAR | Status: DC
  Administered 2017-09-09 – 2017-09-11 (×6): 20 mg via INTRAVENOUS
  Filled 2017-09-09: qty 4
  Filled 2017-09-09 (×2): qty 2
  Filled 2017-09-09 (×2): qty 4
  Filled 2017-09-09: qty 2

## 2017-09-09 MED ORDER — LORATADINE 10 MG PO TABS
10.0000 mg | ORAL_TABLET | Freq: Every day | ORAL | Status: DC
  Administered 2017-09-09 – 2017-09-11 (×3): 10 mg via ORAL
  Filled 2017-09-09 (×3): qty 1

## 2017-09-09 MED ORDER — SENNA 8.6 MG PO TABS
1.0000 | ORAL_TABLET | Freq: Every day | ORAL | Status: DC
  Administered 2017-09-10 – 2017-09-11 (×2): 8.6 mg via ORAL
  Filled 2017-09-09 (×2): qty 1

## 2017-09-09 MED ORDER — ORAL CARE MOUTH RINSE
15.0000 mL | Freq: Two times a day (BID) | OROMUCOSAL | Status: DC
  Administered 2017-09-10 – 2017-09-11 (×3): 15 mL via OROMUCOSAL

## 2017-09-09 MED ORDER — TEMAZEPAM 15 MG PO CAPS: 15.0000 mg | ORAL_CAPSULE | Freq: Every evening | ORAL | Status: DC | PRN

## 2017-09-09 MED ORDER — ONDANSETRON HCL 4 MG PO TABS: 8.0000 mg | ORAL_TABLET | Freq: Two times a day (BID) | ORAL | Status: DC | PRN

## 2017-09-09 MED ORDER — ATORVASTATIN CALCIUM 20 MG PO TABS
20.0000 mg | ORAL_TABLET | Freq: Every day | ORAL | Status: DC
  Administered 2017-09-09 – 2017-09-10 (×2): 20 mg via ORAL
  Filled 2017-09-09 (×2): qty 1

## 2017-09-09 MED ORDER — FAMOTIDINE 20 MG PO TABS
10.0000 mg | ORAL_TABLET | Freq: Every day | ORAL | Status: DC
  Administered 2017-09-09 – 2017-09-11 (×3): 10 mg via ORAL
  Filled 2017-09-09 (×3): qty 1

## 2017-09-09 MED ORDER — CALCIUM CARBONATE ANTACID 500 MG PO CHEW
500.0000 mg | CHEWABLE_TABLET | Freq: Every day | ORAL | Status: DC
  Administered 2017-09-10 – 2017-09-11 (×2): 500 mg via ORAL
  Filled 2017-09-09 (×2): qty 3

## 2017-09-09 MED ORDER — ASPIRIN EC 81 MG PO TBEC
81.0000 mg | DELAYED_RELEASE_TABLET | Freq: Every day | ORAL | Status: DC
  Administered 2017-09-09 – 2017-09-11 (×3): 81 mg via ORAL
  Filled 2017-09-09 (×3): qty 1

## 2017-09-09 MED ORDER — ADULT MULTIVITAMIN W/MINERALS CH
ORAL_TABLET | Freq: Every day | ORAL | Status: DC
  Administered 2017-09-10 – 2017-09-11 (×2): 1 via ORAL
  Filled 2017-09-09 (×2): qty 1

## 2017-09-09 MED ORDER — OXYCODONE HCL 5 MG PO TABS: 10.0000 mg | ORAL_TABLET | Freq: Four times a day (QID) | ORAL | Status: DC | PRN

## 2017-09-09 MED ORDER — SODIUM CHLORIDE 0.9 % IV SOLN
200.0000 mg | Freq: Once | INTRAVENOUS | Status: DC
  Filled 2017-09-09: qty 10

## 2017-09-09 NOTE — ED Notes (Signed)
Report attmepted at this time. RN transporting pt this RN will await a return call for 15 mins. Per protocol.

## 2017-09-09 NOTE — ED Notes (Signed)
Respiratory at bedside, O2 switched to [place] and pt started working harder to breathe, pt placed back on bi-pap. Sats remained between 98-99% on 3L, pt placed back on bi-pap for ease of breathing

## 2017-09-09 NOTE — ED Notes (Signed)
Pt's bipap lifted slightly for pt to drink water from a straw, pt tolerated it well, no distress noted, cont to monitor

## 2017-09-09 NOTE — Consult Note (Addendum)
PULMONARY / CRITICAL CARE MEDICINE   Name: Phillip Sanchez MRN: [id number] DOB: 08-05-1927    ADMISSION DATE:  09/09/2017   CONSULTATION DATE:  09/09/2017  REFERRING MD:  Dr. Marthann Schiller  REASON: Acute respiratory failure  HISTORY OF PRESENT ILLNESS:   84 Y/O WM with colon cancer s/p resection, prostate cancer s/p surgery (50 years ago), and lung cancer stage IV with liver metastasis currently undergoing chemotherapy presenting with progressive shortness of breath and hypoxia. Patient states that at at bout 3 am last night, he woke up with severe difficulty breathing. His SPO2 upon ED arrival was 77%. He was placed on BiPAP and is being admitted to the ICU for further management. His showed pulmonary edema and his labs showed a WBC of 22.6K, and BNP of 458. Upon arrival in the ICU, patient was titrated off BiPAP.  He is now on nasal cannula and reports significant improvement in respiratory status.  He denies chest pain palpitations nausea vomiting dizziness and headache.  He reports dyspnea with minimal exertion. Patient started cycle 2 of chemotherapy on 09/07/2017.  He was also recently treated with amoxicillin for cellulitis of the left foot.  He was started on Lasix as well as Advair and albuterol for shortness of breath. His last brain MRI showed asymptomatic brain metastasis. PAST MEDICAL HISTORY :  He  has a past medical history of Anemia, CHF (congestive heart failure) ([place]), Colon cancer ([place]), Hypercholesteremia, Hypertension, Lung cancer ([place]), Myocardial infarction ([place]), Osteoarthritis, Prostate cancer ([place]), and Small cell lung cancer ([place]) (08/04/2017).  PAST SURGICAL HISTORY: He  has a past surgical history that includes Colon surgery; Coronary angioplasty with stent; herniorrhaphy; and PORTA CATH INSERTION (N/A, 08/05/2017).  No Known Allergies  Current Facility-Administered Medications on File Prior to Encounter  Medication  . etoposide (VEPESID) 200 mg in sodium chloride 0.9 %  500 mL chemo infusion   Current Outpatient Medications on File Prior to Encounter  Medication Sig  . amLODipine-benazepril (LOTREL) 10-40 MG capsule Take 1 capsule by mouth daily.  Marland Kitchen aspirin EC 81 MG tablet Take 81 mg by mouth daily.   Marland Kitchen atorvastatin (LIPITOR) 20 MG tablet Take 20 mg by mouth at bedtime.   . calcium carbonate (OS-CAL - DOSED IN MG OF ELEMENTAL CALCIUM) 1250 (500 Ca) MG tablet Take 1 tablet by mouth daily.  . carvedilol (COREG) 25 MG tablet Take 25 mg by mouth 2 (two) times daily.   . cetirizine (ZYRTEC) 10 MG tablet Take 1 tablet by mouth daily.  . clopidogrel (PLAVIX) 75 MG tablet Take 75 mg by mouth daily.   Marland Kitchen esomeprazole (NEXIUM) 20 MG capsule Take 1 capsule (20 mg total) by mouth daily.  . Fluticasone-Salmeterol (ADVAIR DISKUS) 500-50 MCG/DOSE AEPB Inhale 1 puff into the lungs 2 (two) times daily.  . furosemide (LASIX) 20 MG tablet Take 1 tablet (20 mg total) by mouth daily.  . Multiple Vitamin (MULTI-VITAMINS) TABS Take 1 tablet by mouth daily.   . Oxycodone HCl 10 MG TABS Take 1 tablet (10 mg total) by mouth every 6 (six) hours as needed.  . polyethylene glycol (MIRALAX / GLYCOLAX) packet Take 17 g by mouth daily.  . potassium chloride (K-DUR) 10 MEQ tablet Take 1 tablet (10 mEq total) by mouth daily.  . ranitidine (ZANTAC) 300 MG tablet Take 300 mg by mouth at bedtime.  Marland Kitchen rOPINIRole (REQUIP) 1 MG tablet Take 1 mg by mouth at bedtime.   . senna (SENOKOT) 8.6 MG TABS tablet Take 1 tablet (8.6 mg  total) by mouth daily.  . temazepam (RESTORIL) 15 MG capsule Take 15-30 mg by mouth at bedtime as needed for sleep.   Marland Kitchen amoxicillin (AMOXIL) 500 MG capsule Take 1 capsule (500 mg total) by mouth 3 (three) times daily. (Patient not taking: Reported on 09/09/2017)  . docusate sodium (COLACE) 100 MG capsule Take 1 capsule (100 mg total) by mouth daily as needed.  . dronabinol (MARINOL) 5 MG capsule Take 1 capsule (5 mg total) by mouth 2 (two) times daily before a meal. (Patient not  taking: Reported on 09/09/2017)  . fexofenadine-pseudoephedrine (ALLEGRA-D 24) 180-240 MG per 24 hr tablet Take 1 tablet by mouth daily as needed (for allergies).   . fluticasone (FLONASE) 50 MCG/ACT nasal spray Place 1 spray into the nose daily as needed for allergies or rhinitis.   Marland Kitchen lidocaine-prilocaine (EMLA) cream Apply to affected area once  . ondansetron (ZOFRAN) 8 MG tablet Take 1 tablet (8 mg total) by mouth 2 (two) times daily as needed for refractory nausea / vomiting. Start on day 3 after carboplatin chemo.  . prochlorperazine (COMPAZINE) 10 MG tablet Take 1 tablet (10 mg total) by mouth every 6 (six) hours as needed for nausea or vomiting.    FAMILY HISTORY:  His family history includes Cancer (age of onset: 42) in his brother and sister.  SOCIAL HISTORY: He  reports that he quit smoking about 32 years ago. His smoking use included cigarettes. He has a 36.00 pack-year smoking history. He has never used smokeless tobacco. He reports that he does not drink alcohol or use drugs.  REVIEW OF SYSTEMS:   Constitutional: Negative for fever and chills.  HENT: Negative for congestion and rhinorrhea.  Eyes: Negative for redness and visual disturbance.  Respiratory: Positive for shortness of breath but negative for wheezing.  Cardiovascular: Negative for chest pain and palpitations.  Gastrointestinal: Negative  for nausea , vomiting and abdominal pain and  Loose stools Genitourinary: Negative for dysuria and urgency.  Endocrine: Denies polyuria, polyphagia and heat intolerance Musculoskeletal: Negative for joint swelling but positive for generalized weakness Skin: Negative for pallor and wound.  Neurological: Negative for dizziness and headaches   SUBJECTIVE:   VITAL SIGNS: BP (!) 160/85 (BP Location: Left Arm)   Pulse 95   Temp 98.6 F (37 C) (Axillary)   Resp (!) 22   Ht 5\' 10"  (1.778 m)   Wt 178 lb 2.1 oz (80.8 kg)   SpO2 97%   BMI 25.56 kg/m   HEMODYNAMICS:     VENTILATOR SETTINGS: FiO2 (%):  [30 %] 30 %  INTAKE / OUTPUT: I/O last 3 completed shifts: In: -  Out: 1300 [Urine:1300]  PHYSICAL EXAMINATION: General: Moderate respiratory distress, appears chronically ill Neuro: Alert and oriented x3, no focal deficit HEENT: PERRLA, trachea midline, no JVD Cardiovascular: Apical pulse regular, S1-S2, no murmur regurg or gallop, +2 pulses bilaterally, +1 edema Lungs: Increased work of breathing, bilateral breath sounds, diminished in the bases with diffuse bibasilar Rales and crackles, no wheezing Abdomen: Nondistended, normal bowel sounds in all 4 quadrants Musculoskeletal: Active range of motion, no deformities Skin: Warm and dry  LABS:  BMET Recent Labs  Lab 09/07/17 0905 09/09/17 0801  NA 138 140  K 3.8 4.6  CL 105 105  CO2 23 26  BUN 12 23  CREATININE 0.99 1.14  GLUCOSE 174* 191*    Electrolytes Recent Labs  Lab 09/07/17 0905 09/09/17 0801  CALCIUM 8.5* 8.4*    CBC Recent Labs  Lab 09/07/17  2924 09/09/17 0801  WBC 13.5* 22.6*  HGB 11.2* 12.4*  HCT 32.4* 36.6*  PLT 567* 654*    Coag's No results for input(s): APTT, INR in the last 168 hours.  Sepsis Markers No results for input(s): LATICACIDVEN, PROCALCITON, O2SATVEN in the last 168 hours.  ABG No results for input(s): PHART, PCO2ART, PO2ART in the last 168 hours.  Liver Enzymes Recent Labs  Lab 09/07/17 0905 09/09/17 0801  AST 39 61*  ALT 18 22  ALKPHOS 101 101  BILITOT 0.6 1.0  ALBUMIN 3.1* 3.4*    Cardiac Enzymes Recent Labs  Lab 09/09/17 0801  TROPONINI 0.03*    Glucose Recent Labs  Lab 09/09/17 1840  GLUCAP 126*    Imaging Dg Chest Portable 1 View  Result Date: 09/09/2017 CLINICAL DATA:  Respiratory distress EXAM: PORTABLE CHEST 1 VIEW COMPARISON:  07/28/2017 FINDINGS: Cardiac shadow is stable. Aortic calcifications are again seen. Right chest wall port is now noted. Diffuse vascular congestion with interstitial edema is noted.  Left-sided effusion with likely underlying atelectasis is present as well. IMPRESSION: Changes consistent with CHF with left basilar atelectasis and effusion. Electronically Signed   By: Inez Catalina M.D.   On: 09/09/2017 08:16    STUDIES:  None  CULTURES: Blood cultures x2  ANTIBIOTICS: None  SIGNIFICANT EVENTS: 09/10/17: Admitted  LINES/TUBES: Right chest wall MediPort  DISCUSSION: 84 year old male with stage IV lung cancer currently undergoing chemotherapy presenting with acute pulmonary edema and acute hypoxic respiratory failure secondary to pulmonary edema  ASSESSMENT  Acute hypoxic respiratory failure Acute pulmonary edema Acute CHF exacerbation Stage IV lung cancer Leukocytosis without fever  PLAN Continues BiPAP and titrate to nasal cannula as tolerated Nebulized bronchodilators Inhaled steroids IV Lasix Follow-up with oncology as scheduled Continue all home medications Empiric antibiotics GI and DVT prophylaxis  FAMILY  - Updates: No family at bedside.  Will update when available  Magdalene S. [facility] ANP-BC Pulmonary and Critical Care Medicine [facility] Pager [phone] or [phone]  NB: This document was prepared using Dragon voice recognition software and may include unintentional dictation errors.    09/09/2017, 8:24 PM

## 2017-09-09 NOTE — ED Notes (Signed)
Cassie, RN called to transport pt to ICU 11

## 2017-09-09 NOTE — ED Notes (Signed)
.   Pt is resting, Respirations even and unlabored, NAD. Stretcher lowest postion and locked. Call bell within reach. Denies any needs at this time RN will continue to monitor.    

## 2017-09-09 NOTE — ED Notes (Signed)
Pt VSS becoming more stable at this time as BP is now 168/99. RN will monitor.

## 2017-09-09 NOTE — H&P (Signed)
[facility] NAME: Phillip Sanchez    MR#:  [id number]  DATE OF BIRTH:  August 16, 1927  DATE OF ADMISSION:  09/09/2017  PRIMARY CARE PHYSICIAN: Idelle Crouch, MD   REQUESTING/REFERRING PHYSICIAN: Paduchowski  CHIEF COMPLAINT:   Chief Complaint  Patient presents with  . Respiratory Distress    HISTORY OF PRESENT ILLNESS: Phillip Sanchez  is a 84 y.o. male with a known history of anemia, CHF, colon cancer, hypercholesterolemia, hypertension, lung cancer, MI, prostate cancer, small cell lung cancer-has been feeling progressively shortness of breath for last few weeks also tried increasing dose of Lasix by primary care physician. He has to use 2-3 pillows at night and have worsening swelling on the legs. He received 2 course of his chemotherapy and today was supposed to go to cancer center to have a third dose of chemo but was significantly short of breath and having chest tightness so came to emergency room. Noted to have CHF and Hypoxia up to 70% in ER, started on BiPAP and felt better so ER suggested to admit to medical services for acute CHF.  PAST MEDICAL HISTORY:   Past Medical History:  Diagnosis Date  . Anemia   . CHF (congestive heart failure) ([place])   . Colon cancer [facility])    He states they removed cancerous polyps.  . Hypercholesteremia   . Hypertension   . Lung cancer ([place])   . Myocardial infarction ([place])   . Osteoarthritis   . Prostate cancer [facility])    Prostatectomy.  . Small cell lung cancer ([place]) 08/04/2017    PAST SURGICAL HISTORY:  Past Surgical History:  Procedure Laterality Date  . COLON SURGERY    . CORONARY ANGIOPLASTY WITH STENT PLACEMENT    . herniorrhaphy    . PORTA CATH INSERTION N/A 08/05/2017   Procedure: PORTA CATH INSERTION;  Surgeon: Katha Cabal, MD;  Location: [facility];  Service: Cardiovascular;  Laterality: N/A;    SOCIAL HISTORY:  Social History   Tobacco Use  . Smoking status:  Former Smoker    Packs/day: 1.00    Years: 36.00    Pack years: 36.00    Types: Cigarettes    Last attempt to quit: 1987    Years since quitting: 32.5  . Smokeless tobacco: Never Used  Substance Use Topics  . Alcohol use: No    Alcohol/week: 0.0 oz    FAMILY HISTORY:  Family History  Problem Relation Age of Onset  . Cancer Brother 34       unknown orgin  . Cancer Sister 18       unknown type    DRUG ALLERGIES: No Known Allergies  REVIEW OF SYSTEMS:   CONSTITUTIONAL: No fever, fatigue or weakness.  EYES: No blurred or double vision.  EARS, NOSE, AND THROAT: No tinnitus or ear pain.  RESPIRATORY: No cough, have shortness of breath, no wheezing or hemoptysis.  CARDIOVASCULAR: No chest pain, orthopnea, edema.  GASTROINTESTINAL: No nausea, vomiting, diarrhea or abdominal pain.  GENITOURINARY: No dysuria, hematuria.  ENDOCRINE: No polyuria, nocturia,  HEMATOLOGY: No anemia, easy bruising or bleeding SKIN: No rash or lesion. MUSCULOSKELETAL: No joint pain or arthritis.   NEUROLOGIC: No tingling, numbness, weakness.  PSYCHIATRY: No anxiety or depression.   MEDICATIONS AT HOME:  Prior to Admission medications   Medication Sig Start Date End Date Taking? Authorizing Provider  amLODipine-benazepril (LOTREL) 10-40 MG capsule Take 1 capsule by mouth daily.   Yes [provider]  aspirin EC 81 MG tablet Take 81 mg by mouth daily.    Yes [provider]  atorvastatin (LIPITOR) 20 MG tablet Take 20 mg by mouth at bedtime.    Yes [provider]  calcium carbonate (OS-CAL - DOSED IN MG OF ELEMENTAL CALCIUM) 1250 (500 Ca) MG tablet Take 1 tablet by mouth daily.   Yes [provider]  carvedilol (COREG) 25 MG tablet Take 25 mg by mouth 2 (two) times daily.    Yes [provider]  cetirizine (ZYRTEC) 10 MG tablet Take 1 tablet by mouth daily.   Yes [provider]  clopidogrel (PLAVIX) 75 MG tablet Take 75 mg by mouth daily.    Yes  [provider]  esomeprazole (NEXIUM) 20 MG capsule Take 1 capsule (20 mg total) by mouth daily. 08/09/17  Yes Verlon Au, NP  Fluticasone-Salmeterol (ADVAIR DISKUS) 500-50 MCG/DOSE AEPB Inhale 1 puff into the lungs 2 (two) times daily. 09/07/17  Yes Cammie Sickle, MD  furosemide (LASIX) 20 MG tablet Take 1 tablet (20 mg total) by mouth daily. 07/31/17  Yes Sudini, Alveta Heimlich, MD  Multiple Vitamin (MULTI-VITAMINS) TABS Take 1 tablet by mouth daily.    Yes [provider]  Oxycodone HCl 10 MG TABS Take 1 tablet (10 mg total) by mouth every 6 (six) hours as needed. 09/05/17  Yes Earlie Server, MD  polyethylene glycol [facility] / Floria Raveling) packet Take 17 g by mouth daily. 08/09/17  Yes Verlon Au, NP  potassium chloride (K-DUR) 10 MEQ tablet Take 1 tablet (10 mEq total) by mouth daily. 07/31/17  Yes Sudini, Alveta Heimlich, MD  ranitidine (ZANTAC) 300 MG tablet Take 300 mg by mouth at bedtime.   Yes [provider]  rOPINIRole (REQUIP) 1 MG tablet Take 1 mg by mouth at bedtime.    Yes [provider]  senna (SENOKOT) 8.6 MG TABS tablet Take 1 tablet (8.6 mg total) by mouth daily. 08/09/17  Yes Verlon Au, NP  temazepam (RESTORIL) 15 MG capsule Take 15-30 mg by mouth at bedtime as needed for sleep.    Yes [provider]  amoxicillin (AMOXIL) 500 MG capsule Take 1 capsule (500 mg total) by mouth 3 (three) times daily. Patient not taking: Reported on 09/09/2017 08/31/17   Cammie Sickle, MD  docusate sodium (COLACE) 100 MG capsule Take 1 capsule (100 mg total) by mouth daily as needed. 07/18/17 07/18/18  Schuyler Amor, MD  dronabinol (MARINOL) 5 MG capsule Take 1 capsule (5 mg total) by mouth 2 (two) times daily before a meal. Patient not taking: Reported on 09/09/2017 08/04/17   Earlie Server, MD  fexofenadine-pseudoephedrine (ALLEGRA-D 24) 180-240 MG per 24 hr tablet Take 1 tablet by mouth daily as needed (for allergies).     [provider]   fluticasone (FLONASE) 50 MCG/ACT nasal spray Place 1 spray into the nose daily as needed for allergies or rhinitis.     [provider]  lidocaine-prilocaine (EMLA) cream Apply to affected area once 08/04/17   Earlie Server, MD  ondansetron (ZOFRAN) 8 MG tablet Take 1 tablet (8 mg total) by mouth 2 (two) times daily as needed for refractory nausea / vomiting. Start on day 3 after carboplatin chemo. 08/04/17   Earlie Server, MD  prochlorperazine (COMPAZINE) 10 MG tablet Take 1 tablet (10 mg total) by mouth every 6 (six) hours as needed for nausea or vomiting. 08/10/17   Cammie Sickle, MD  PHYSICAL EXAMINATION:   VITAL SIGNS: Blood pressure (!) 142/73, pulse 84, temperature 97.8 F (36.6 C), temperature source Axillary, resp. rate 18, height 5\' 10"  (1.778 m), weight 81.6 kg (180 lb), SpO2 100 %.  GENERAL:  84 y.o.-year-old patient lying in the bed with no acute distress.  EYES: Pupils equal, round, reactive to light and accommodation. No scleral icterus. Extraocular muscles intact.  HEENT: Head atraumatic, normocephalic. Oropharynx and nasopharynx clear.  NECK:  Supple, no jugular venous distention. No thyroid enlargement, no tenderness.  LUNGS: Normal breath sounds bilaterally, no wheezing, b/l crepitation. No use of accessory muscles of respiration. On bipap use. CARDIOVASCULAR: S1, S2 normal. No murmurs, rubs, or gallops.  ABDOMEN: Soft, nontender, nondistended. Bowel sounds present. No organomegaly or mass.  EXTREMITIES: b/l pedal edema,no cyanosis, or clubbing.  NEUROLOGIC: Cranial nerves II through XII are intact. Muscle strength 4/5 in all extremities. Sensation intact. Gait not checked.  PSYCHIATRIC: The patient is alert and oriented x 3.  SKIN: No obvious rash, lesion, or ulcer.   LABORATORY PANEL:   CBC Recent Labs  Lab 09/07/17 0905 09/09/17 0801  WBC 13.5* 22.6*  HGB 11.2* 12.4*  HCT 32.4* 36.6*  PLT 567* 654*  MCV 98.8 100.3*  MCH 34.0 34.0  MCHC 34.4 33.9   RDW 14.8* 14.9*  LYMPHSABS 1.1  --   MONOABS 1.3*  --   EOSABS 0.1  --   BASOSABS 0.0  --    ------------------------------------------------------------------------------------------------------------------  Chemistries  Recent Labs  Lab 09/07/17 0905 09/09/17 0801  NA 138 140  K 3.8 4.6  CL 105 105  CO2 23 26  GLUCOSE 174* 191*  BUN 12 23  CREATININE 0.99 1.14  CALCIUM 8.5* 8.4*  AST 39 61*  ALT 18 22  ALKPHOS 101 101  BILITOT 0.6 1.0   ------------------------------------------------------------------------------------------------------------------ estimated creatinine clearance is 46.2 mL/min (by C-G formula based on SCr of 1.14 mg/dL). ------------------------------------------------------------------------------------------------------------------ Recent Labs    09/07/17 0905  TSH 0.773     Coagulation profile No results for input(s): INR, PROTIME in the last 168 hours. ------------------------------------------------------------------------------------------------------------------- No results for input(s): DDIMER in the last 72 hours. -------------------------------------------------------------------------------------------------------------------  Cardiac Enzymes Recent Labs  Lab 09/09/17 0801  TROPONINI 0.03*   ------------------------------------------------------------------------------------------------------------------ Invalid input(s): POCBNP  ---------------------------------------------------------------------------------------------------------------  Urinalysis    Component Value Date/Time   COLORURINE YELLOW (A) 08/10/2017 1033   APPEARANCEUR CLEAR (A) 08/10/2017 1033   APPEARANCEUR Clear 10/20/2013 1526   LABSPEC 1.016 08/10/2017 1033   LABSPEC 1.025 10/20/2013 1526   PHURINE 5.0 08/10/2017 1033   GLUCOSEU NEGATIVE 08/10/2017 1033   GLUCOSEU 50 mg/dL 10/20/2013 1526   HGBUR NEGATIVE 08/10/2017 1033   BILIRUBINUR NEGATIVE  08/10/2017 1033   BILIRUBINUR Negative 10/20/2013 1526   KETONESUR NEGATIVE 08/10/2017 1033   PROTEINUR NEGATIVE 08/10/2017 1033   NITRITE NEGATIVE 08/10/2017 1033   LEUKOCYTESUR NEGATIVE 08/10/2017 1033   LEUKOCYTESUR Negative 10/20/2013 1526     RADIOLOGY: Dg Chest Portable 1 View  Result Date: 09/09/2017 CLINICAL DATA:  Respiratory distress EXAM: PORTABLE CHEST 1 VIEW COMPARISON:  07/28/2017 FINDINGS: Cardiac shadow is stable. Aortic calcifications are again seen. Right chest wall port is now noted. Diffuse vascular congestion with interstitial edema is noted. Left-sided effusion with likely underlying atelectasis is present as well. IMPRESSION: Changes consistent with CHF with left basilar atelectasis and effusion. Electronically Signed   By: Inez Catalina M.D.   On: 09/09/2017 08:16    EKG: Orders placed or performed in visit on 09/09/17  . EKG 12-Lead  IMPRESSION AND PLAN:  *Acute hypoxic respiratory failure Due to acute diastolic congestive heart failure  Continue BiPAP, try to taper to nasal cannula. Meanwhile will monitor in stepdown unit due to bipap use. IV Lasix, intake and output measurement. Cardiology consult. Echo.  *Hypertension Continue home medications and hydralazine injection as needed.  *History of coronary artery disease Continue aspirin, Plavix, carvedilol, atorvastatin.  *Hyperlipidemia Continue atorvastatin.  *Elevated white blood cell count This could be reactive from his recent chemotherapy  All the records are reviewed and case discussed with ED provider. Management plans discussed with the patient, family and they are in agreement.  CODE STATUS: Full. Code Status History    Date Active Date Inactive Code Status Order ID Comments User Context   07/28/2017 1554 07/31/2017 1706 DNR [id number]  Max Sane, MD Inpatient   07/26/2017 1552 07/28/2017 1554 Full Code [id number]  Harrie Foreman, MD Inpatient    Questions for Most Recent  Historical Code Status (Order [id number])    Question Answer Comment   In the event of cardiac or respiratory ARREST Do not call a "code blue"    In the event of cardiac or respiratory ARREST Do not perform Intubation, CPR, defibrillation or ACLS    In the event of cardiac or respiratory ARREST Use medication by any route, position, wound care, and other measures to relive pain and suffering. May use oxygen, suction and manual treatment of airway obstruction as needed for comfort.         Advance Directive Documentation     Most Recent Value  Type of Advance Directive  Living will, Healthcare Power of Attorney  Pre-existing out of facility DNR order (yellow form or pink MOST form)  -  "MOST" Form in Place?  -     Discussed with him, his son, daughter and wife in room.  TOTAL TIME TAKING CARE OF THIS PATIENT: 50 minutes.    Vaughan Basta M.D on 09/09/2017   Between 7am to 6pm - Pager - [phone]  After 6pm go to [email or web address] - password [email or web address] [facility]  Office  [phone]  CC: Primary care physician; Idelle Crouch, MD   Note: This dictation was prepared with Dragon dictation along with smaller phrase technology. Any transcriptional errors that result from this process are unintentional.

## 2017-09-09 NOTE — ED Provider Notes (Signed)
[facility] Emergency Department Provider Note  Time seen: 8:02 AM  I have reviewed the triage vital signs and the nursing notes.   HISTORY  Chief Complaint Respiratory Distress    HPI Phillip Sanchez is a 84 y.o. male with a past medical history of anemia, CHF, hypertension, hyperlipidemia, lung cancer on chemotherapy, presents to the emergency department for difficulty breathing.  According to EMS report patient awoke with difficulty breathing this morning, awoke his wife saying he needed to go to the hospital.  EMS states upon arrival patient is in significant respiratory distress quite diaphoretic.  O2 sats on CPAP in the 60s, the highest they are able to obtain is 84%.  Upon arrival to the emergency department patient remains in moderate respiratory distress, moderate diaphoresis, accessory muscle use.  Patient denies any chest pain or abdominal pain.  Denies any fever.   Past Medical History:  Diagnosis Date  . Anemia   . CHF (congestive heart failure) ([place])   . Colon cancer [facility])    He states they removed cancerous polyps.  . Hypercholesteremia   . Hypertension   . Lung cancer ([place])   . Myocardial infarction ([place])   . Osteoarthritis   . Prostate cancer [facility])    Prostatectomy.  . Small cell lung cancer ([place]) 08/04/2017    Patient Active Problem List   Diagnosis Date Noted  . Small cell lung cancer ([place]) 08/04/2017  . Liver masses   . Goals of care, counseling/discussion   . Palliative care by specialist   . CAP (community acquired pneumonia) 07/26/2017  . Liver metastases ([place]) 07/19/2017  . Cancer of lower lobe of left lung ([place]) 01/16/2016    Past Surgical History:  Procedure Laterality Date  . COLON SURGERY    . CORONARY ANGIOPLASTY WITH STENT PLACEMENT    . herniorrhaphy    . PORTA CATH INSERTION N/A 08/05/2017   Procedure: PORTA CATH INSERTION;  Surgeon: Katha Cabal, MD;  Location: [facility];  Service: Cardiovascular;   Laterality: N/A;    Prior to Admission medications   Medication Sig Start Date End Date Taking? Authorizing Provider  Albuterol Sulfate (PROAIR RESPICLICK) 902 (90 Base) MCG/ACT AEPB Inhale 1 puff into the lungs every 4 (four) hours as needed. 09/07/17   Cammie Sickle, MD  amLODipine-benazepril (LOTREL) 10-40 MG capsule Take 1 capsule by mouth daily.    [provider]  amoxicillin (AMOXIL) 500 MG capsule Take 1 capsule (500 mg total) by mouth 3 (three) times daily. 08/31/17   Cammie Sickle, MD  aspirin EC 81 MG tablet Take 81 mg by mouth daily.     [provider]  atorvastatin (LIPITOR) 20 MG tablet Take 20 mg by mouth at bedtime.     [provider]  calcium carbonate (OS-CAL - DOSED IN MG OF ELEMENTAL CALCIUM) 1250 (500 Ca) MG tablet Take 1 tablet by mouth daily.    [provider]  carvedilol (COREG) 25 MG tablet Take 25 mg by mouth 2 (two) times daily.     [provider]  cetirizine (ZYRTEC) 10 MG tablet Take 1 tablet by mouth daily.    [provider]  clopidogrel (PLAVIX) 75 MG tablet Take 75 mg by mouth daily.     [provider]  docusate sodium (COLACE) 100 MG capsule Take 1 capsule (100 mg total) by mouth daily as needed. 07/18/17 07/18/18  Schuyler Amor, MD  dronabinol (MARINOL) 5 MG capsule Take 1  capsule (5 mg total) by mouth 2 (two) times daily before a meal. 08/04/17   Earlie Server, MD  esomeprazole (NEXIUM) 20 MG capsule Take 1 capsule (20 mg total) by mouth daily. 08/09/17   Verlon Au, NP  fexofenadine-pseudoephedrine (ALLEGRA-D 24) 180-240 MG per 24 hr tablet Take 1 tablet by mouth daily as needed (for allergies).     [provider]  fluticasone (FLONASE) 50 MCG/ACT nasal spray Place 1 spray into the nose daily as needed for allergies or rhinitis.     [provider]  Fluticasone-Salmeterol (ADVAIR DISKUS) 500-50 MCG/DOSE AEPB Inhale 1 puff into the lungs 2 (two) times daily. 09/07/17    Cammie Sickle, MD  furosemide (LASIX) 20 MG tablet Take 1 tablet (20 mg total) by mouth daily. 07/31/17   Hillary Bow, MD  lidocaine-prilocaine (EMLA) cream Apply to affected area once 08/04/17   Earlie Server, MD  Multiple Vitamin (MULTI-VITAMINS) TABS Take 1 tablet by mouth daily.     [provider]  ondansetron (ZOFRAN) 8 MG tablet Take 1 tablet (8 mg total) by mouth 2 (two) times daily as needed for refractory nausea / vomiting. Start on day 3 after carboplatin chemo. 08/04/17   Earlie Server, MD  Oxycodone HCl 10 MG TABS Take 1 tablet (10 mg total) by mouth every 6 (six) hours as needed. 09/05/17   Earlie Server, MD  polyethylene glycol [facility] / Floria Raveling) packet Take 17 g by mouth daily. 08/09/17   Verlon Au, NP  potassium chloride (K-DUR) 10 MEQ tablet Take 1 tablet (10 mEq total) by mouth daily. 07/31/17   Hillary Bow, MD  prochlorperazine (COMPAZINE) 10 MG tablet Take 1 tablet (10 mg total) by mouth every 6 (six) hours as needed for nausea or vomiting. 08/10/17   Cammie Sickle, MD  ranitidine (ZANTAC) 300 MG tablet Take 300 mg by mouth at bedtime.    [provider]  rOPINIRole (REQUIP) 1 MG tablet Take 1 mg by mouth at bedtime.     [provider]  senna (SENOKOT) 8.6 MG TABS tablet Take 1 tablet (8.6 mg total) by mouth daily. 08/09/17   Verlon Au, NP  temazepam (RESTORIL) 15 MG capsule Take 15-30 mg by mouth at bedtime as needed for sleep.     [provider]    No Known Allergies  Family History  Problem Relation Age of Onset  . Cancer Brother 46       unknown orgin  . Cancer Sister 41       unknown type    Social History Social History   Tobacco Use  . Smoking status: Former Smoker    Packs/day: 1.00    Years: 36.00    Pack years: 36.00    Types: Cigarettes    Last attempt to quit: 1987    Years since quitting: 32.5  . Smokeless tobacco: Never Used  Substance Use Topics  . Alcohol use: No    Alcohol/week: 0.0 oz   . Drug use: No    Review of Systems Constitutional: Negative for fever. Cardiovascular: Negative for chest pain. Respiratory: Positive for shortness of breath Gastrointestinal: Negative for abdominal pain, vomiting and diarrhea. Genitourinary: Negative for urinary compaints Musculoskeletal: Mild leg swelling Skin: Diaphoresis Neurological: Negative for headache All other ROS negative  ____________________________________________   PHYSICAL EXAM:  VITAL SIGNS: ED Triage Vitals  Enc Vitals Group     BP 09/09/17 0759 (!) 164/113     Pulse Rate 09/09/17 0758 Marland Kitchen)  127     Resp --      Temp 09/09/17 0758 97.8 F (36.6 C)     Temp Source 09/09/17 0758 Axillary     SpO2 09/09/17 0758 (!) 85 %     Weight 09/09/17 0759 180 lb (81.6 kg)     Height 09/09/17 0759 5\' 10"  (1.778 m)     Head Circumference --      Peak Flow --      Pain Score 09/09/17 0759 0     Pain Loc --      Pain Edu? --      Excl. in [place]? --    Constitutional: Patient is alert, moderate distress due to difficulty breathing. Eyes: Normal exam ENT   Head: Normocephalic and atraumatic.   Mouth/Throat: Mucous membranes are moist. Cardiovascular: Appears to have a regular rhythm rate around 120. Respiratory: Significant tachypnea, moderate respiratory distress, accessory muscle use.  Diffuse rales. Gastrointestinal: Soft and nontender. No distention.   Musculoskeletal: Nontender with normal range of motion in all extremities. Neurologic:  Normal speech and language. No gross focal neurologic deficits Skin:  Skin is warm, dry and intact.  Psychiatric: Mood and affect are normal  ____________________________________________    EKG  EKG reviewed and interpreted by myself shows sinus tachycardia 129 bpm with a widened QRS, left axis deviation, largely normal intervals.  Lateral ST depressions.  Left bundle branch block is old compared to prior EKG  ____________________________________________     RADIOLOGY  Changes consistent with CHF  ____________________________________________   INITIAL IMPRESSION / ASSESSMENT AND PLAN / ED COURSE  Pertinent labs & imaging results that were available during my care of the patient were reviewed by me and considered in my medical decision making (see chart for details).  Patient presents to the emergency department in respiratory distress via emergency traffic.  Upon arrival remains in moderate respiratory distress diaphoretic tachypneic with accessory muscle use tachycardic.  Patient placed immediately on BiPAP, sats in the 80s on 100% BiPAP patient is satting in the upper 90s.  Differential would include CHF exacerbation, ACS, pneumonia or pneumothorax.  We will check labs, stat portable chest x-ray.  Patient's blood pressure currently 164/113.  Patient received IV enalapril as well as 1.5 inches of nitroglycerin paste in route to the hospital.  We will also dose IV hydralazine in an attempt to reduce afterload given the patient's continued moderate respiratory distress even on BiPAP.  Changes are consistent with CHF.  Patient is feeling better on BiPAP.  Blood pressure has reduced currently 140/75, heart rate has reduced to 72.  Highly suspect CHF/flash pulmonary edema.  Patient will be admitted to the hospital service for continued treatment.  We will dose IV Lasix.   CRITICAL CARE Performed by: Harvest Dark   Total critical care time: 45 minutes  Critical care time was exclusive of separately billable procedures and treating other patients.  Critical care was necessary to treat or prevent imminent or life-threatening deterioration.  Critical care was time spent personally by me on the following activities: development of treatment plan with patient and/or surrogate as well as nursing, discussions with consultants, evaluation of patient's response to treatment, examination of patient, obtaining history from patient or surrogate,  ordering and performing treatments and interventions, ordering and review of laboratory studies, ordering and review of radiographic studies, pulse oximetry and re-evaluation of patient's condition.  ____________________________________________   FINAL CLINICAL IMPRESSION(S) / ED DIAGNOSES  CHF exacerbation Dyspnea Pulmonary edema   Harvest Dark, MD  09/09/17 0950  

## 2017-09-09 NOTE — ED Notes (Signed)
Condom cath was placed on pt a this time. RN will monitor.

## 2017-09-09 NOTE — Progress Notes (Signed)
Patients oxygen saturations were 100% on 50% FiO2, decreased to 30% and will continue to monitor.

## 2017-09-09 NOTE — Progress Notes (Signed)
Family Meeting Note  Advance Directive:yes  Today a meeting took place with the Patient, spouse and son and daughter.  The following clinical team members were present during this meeting:MD  The following were discussed:Patient's diagnosis: Ac respi failure, CHF, Lung cancer , Patient's progosis: Unable to determine and Goals for treatment: Full Code  Additional follow-up to be provided: Cardiology  Time spent during discussion:20 minutes  Vaughan Basta, MD

## 2017-09-09 NOTE — ED Notes (Signed)
Pt is sleeping at this time. VSS pt is NAD and family is at bedside. RN will monitor.

## 2017-09-09 NOTE — Consult Note (Signed)
Cardiology Consultation Note    Patient ID: Phillip Sanchez, MRN: [id number], DOB/AGE: 82-Jan-1929 82 y.o. Admit date: 09/09/2017   Date of Consult: 09/09/2017 Primary Physician: Idelle Crouch, MD Primary Cardiologist: Dr. Ubaldo Glassing  Chief Complaint: sob Reason for Consultation: Duanne Limerick Requesting MD: Dr. Doy Hutching         HPI: Phillip Sanchez is a 84 y.o. male with history of hypertension, hyperlipidemia, coronary artery disease status post PCI of the RCA in 2006. Relook heart cath in 2010 after a non-ST elevation myocardial infarction revealed normal PDA with a small PL occlusion with collateral filling. The RCA stent was widely patent. His LAD had diffuse heavily calcified disease. His left circumflex had a proximal 70% which extended back to the ostium of the left main. He had small OM1and O2. Left main was 20% stenosis. Medical management was recommended. In April 2017, he developed severe chest pain and on his electrocardiogram was noted again to have left bundle branch block. He was transferred to [facility] where he had extensive noninvasive workup including a functional study which revealed fixed infero-basilar to mid inferior as well as inferolateral infarct.  He underwent cardiac MRI at [facility] in 1/19 showing subendocardial myocardial infarction in this region.  Cardiac catheterization in 2/19 revealed no significant change in his anatomy. Medical management was recommended. He now presents with progressive sob and fatigue. CXR revealed chf with left basilar atelectasis. EKG revealed sinus tach with lbbb. He has ruled out for an mi with no significant troponin elevation.       Past Medical History:  Diagnosis Date  . Anemia   . CHF (congestive heart failure) ([place])   . Colon cancer [facility])    He states they removed cancerous polyps.  . Hypercholesteremia   . Hypertension   . Lung cancer ([place])   . Myocardial infarction ([place])   . Osteoarthritis   . Prostate cancer  [facility])    Prostatectomy.  . Small cell lung cancer ([place]) 08/04/2017      Surgical History:  Past Surgical History:  Procedure Laterality Date  . COLON SURGERY    . CORONARY ANGIOPLASTY WITH STENT PLACEMENT    . herniorrhaphy    . PORTA CATH INSERTION N/A 08/05/2017   Procedure: PORTA CATH INSERTION;  Surgeon: Katha Cabal, MD;  Location: [place] [facility];  Service: Cardiovascular;  Laterality: N/A;     Home Meds: Prior to Admission medications   Medication Sig Start Date End Date Taking? Authorizing Provider  amLODipine-benazepril (LOTREL) 10-40 MG capsule Take 1 capsule by mouth daily.   Yes [provider]  aspirin EC 81 MG tablet Take 81 mg by mouth daily.    Yes [provider]  atorvastatin (LIPITOR) 20 MG tablet Take 20 mg by mouth at bedtime.    Yes [provider]  calcium carbonate (OS-CAL - DOSED IN MG OF ELEMENTAL CALCIUM) 1250 (500 Ca) MG tablet Take 1 tablet by mouth daily.   Yes [provider]  carvedilol (COREG) 25 MG tablet Take 25 mg by mouth 2 (two) times daily.    Yes [provider]  cetirizine (ZYRTEC) 10 MG tablet Take 1 tablet by mouth daily.   Yes [provider]  clopidogrel (PLAVIX) 75 MG tablet Take 75 mg by mouth daily.    Yes [provider]  esomeprazole (NEXIUM) 20 MG capsule Take 1 capsule (20 mg total) by mouth daily. 08/09/17  Yes Verlon Au, NP  Fluticasone-Salmeterol (  ADVAIR DISKUS) 500-50 MCG/DOSE AEPB Inhale 1 puff into the lungs 2 (two) times daily. 09/07/17  Yes Cammie Sickle, MD  furosemide (LASIX) 20 MG tablet Take 1 tablet (20 mg total) by mouth daily. 07/31/17  Yes Sudini, Alveta Heimlich, MD  Multiple Vitamin (MULTI-VITAMINS) TABS Take 1 tablet by mouth daily.    Yes [provider]  Oxycodone HCl 10 MG TABS Take 1 tablet (10 mg total) by mouth every 6 (six) hours as needed. 09/05/17  Yes Earlie Server, MD  polyethylene glycol [facility] Floria Raveling) packet Take 17 g by  mouth daily. 08/09/17  Yes Verlon Au, NP  potassium chloride (K-DUR) 10 MEQ tablet Take 1 tablet (10 mEq total) by mouth daily. 07/31/17  Yes Sudini, Alveta Heimlich, MD  ranitidine (ZANTAC) 300 MG tablet Take 300 mg by mouth at bedtime.   Yes [provider]  rOPINIRole (REQUIP) 1 MG tablet Take 1 mg by mouth at bedtime.    Yes [provider]  senna (SENOKOT) 8.6 MG TABS tablet Take 1 tablet (8.6 mg total) by mouth daily. 08/09/17  Yes Verlon Au, NP  temazepam (RESTORIL) 15 MG capsule Take 15-30 mg by mouth at bedtime as needed for sleep.    Yes [provider]  amoxicillin (AMOXIL) 500 MG capsule Take 1 capsule (500 mg total) by mouth 3 (three) times daily. Patient not taking: Reported on 09/09/2017 08/31/17   Cammie Sickle, MD  docusate sodium (COLACE) 100 MG capsule Take 1 capsule (100 mg total) by mouth daily as needed. 07/18/17 07/18/18  Schuyler Amor, MD  dronabinol (MARINOL) 5 MG capsule Take 1 capsule (5 mg total) by mouth 2 (two) times daily before a meal. Patient not taking: Reported on 09/09/2017 08/04/17   Earlie Server, MD  fexofenadine-pseudoephedrine (ALLEGRA-D 24) 180-240 MG per 24 hr tablet Take 1 tablet by mouth daily as needed (for allergies).     [provider]  fluticasone (FLONASE) 50 MCG/ACT nasal spray Place 1 spray into the nose daily as needed for allergies or rhinitis.     [provider]  lidocaine-prilocaine (EMLA) cream Apply to affected area once 08/04/17   Earlie Server, MD  ondansetron (ZOFRAN) 8 MG tablet Take 1 tablet (8 mg total) by mouth 2 (two) times daily as needed for refractory nausea / vomiting. Start on day 3 after carboplatin chemo. 08/04/17   Earlie Server, MD  prochlorperazine (COMPAZINE) 10 MG tablet Take 1 tablet (10 mg total) by mouth every 6 (six) hours as needed for nausea or vomiting. 08/10/17   Cammie Sickle, MD    Inpatient Medications:  . amLODipine  10 mg Oral Daily   And  . benazepril  40 mg Oral  Daily  . aspirin EC  81 mg Oral Daily  . atorvastatin  20 mg Oral QHS  . [START ON 09/10/2017] calcium carbonate  500 mg of elemental calcium Oral Daily  . carvedilol  25 mg Oral BID  . clopidogrel  75 mg Oral Daily  . famotidine  10 mg Oral Daily  . furosemide  20 mg Intravenous Q8H  . heparin  5,000 Units Subcutaneous Q8H  . loratadine  10 mg Oral Daily  . mouth rinse  15 mL Mouth Rinse BID  . mometasone-formoterol  2 puff Inhalation BID  . multivitamin with minerals   Oral Daily  . pantoprazole  40 mg Oral Daily  . polyethylene glycol  17 g Oral Daily  . senna  1 tablet Oral Daily  Allergies: No Known Allergies  Social History   Socioeconomic History  . Marital status: Married    Spouse name: Not on file  . Number of children: Not on file  . Years of education: Not on file  . Highest education level: Not on file  Occupational History  . Not on file  Social Needs  . Financial resource strain: Not on file  . Food insecurity:    Worry: Not on file    Inability: Not on file  . Transportation needs:    Medical: Not on file    Non-medical: Not on file  Tobacco Use  . Smoking status: Former Smoker    Packs/day: 1.00    Years: 36.00    Pack years: 36.00    Types: Cigarettes    Last attempt to quit: 1987    Years since quitting: 32.5  . Smokeless tobacco: Never Used  Substance and Sexual Activity  . Alcohol use: No    Alcohol/week: 0.0 oz  . Drug use: No  . Sexual activity: Not Currently  Lifestyle  . Physical activity:    Days per week: Not on file    Minutes per session: Not on file  . Stress: Not on file  Relationships  . Social connections:    Talks on phone: Not on file    Gets together: Not on file    Attends religious service: Not on file    Active member of club or organization: Not on file    Attends meetings of clubs or organizations: Not on file    Relationship status: Not on file  . Intimate partner violence:    Fear of current or ex partner:  Not on file    Emotionally abused: Not on file    Physically abused: Not on file    Forced sexual activity: Not on file  Other Topics Concern  . Not on file  Social History Narrative  . Not on file     Family History  Problem Relation Age of Onset  . Cancer Brother 75       unknown orgin  . Cancer Sister 67       unknown type     Review of Systems: A 12-system review of systems was performed and is negative except as noted in the HPI.  Labs: Recent Labs    09/09/17 0801  TROPONINI 0.03*   Lab Results  Component Value Date   WBC 22.6 (H) 09/09/2017   HGB 12.4 (L) 09/09/2017   HCT 36.6 (L) 09/09/2017   MCV 100.3 (H) 09/09/2017   PLT 654 (H) 09/09/2017    Recent Labs  Lab 09/09/17 0801  NA 140  K 4.6  CL 105  CO2 26  BUN 23  CREATININE 1.14  CALCIUM 8.4*  PROT 7.3  BILITOT 1.0  ALKPHOS 101  ALT 22  AST 61*  GLUCOSE 191*   Lab Results  Component Value Date   CHOL 92 10/21/2013   HDL 28 (L) 10/21/2013   LDLCALC 43 10/21/2013   TRIG 104 10/21/2013   No results found for: DDIMER  Radiology/Studies:  Mr Jeri Cos TK Contrast  Result Date: 08/17/2017 CLINICAL DATA:  Small cell lung cancer. Staging. No neurologic symptoms. EXAM: MRI HEAD WITHOUT AND WITH CONTRAST TECHNIQUE: Multiplanar, multiecho pulse sequences of the brain and surrounding structures were obtained without and with intravenous contrast. CONTRAST:  61mL MULTIHANCE GADOBENATE DIMEGLUMINE 529 MG/ML IV SOLN COMPARISON:  PET scan 01/23/2014. CT abdomen and pelvis 07/26/2017. MR  head 09/03/2008. FINDINGS: Brain: No evidence for acute infarction, hemorrhage, hydrocephalus, or extra-axial fluid. Generalized atrophy, not unexpected for age. Moderate T2 and FLAIR hyperintensities throughout periventricular and subcortical white matter, likely small vessel disease. Post infusion, there are two visible metastatic lesions. There is a 5 mm metastasis to the LEFT cerebellar peduncle. Only minimal surrounding  edema. No midline shift. No mass effect on the fourth ventricle. A second metastatic lesion, 4 mm, is observed in the inferior temporal lobe, just lateral and inferior to the ventricle. Again no significant mass effect, and only minimal surrounding edema. No other definite metastatic lesions can be seen. Vascular: Normal flow voids. Skull and upper cervical spine: No definite skull base or upper cervical lesions. Mild pannus surrounds the odontoid. Sinuses/Orbits: Negative Other: None. 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 No significant mass effect or edema. Mild atrophy.  Moderate small vessel disease. A call has been placed to the ordering provider. Electronically Signed   By: Staci Righter M.D.   On: 08/17/2017 12:43   Dg Chest Portable 1 View  Result Date: 09/09/2017 CLINICAL DATA:  Respiratory distress EXAM: PORTABLE CHEST 1 VIEW COMPARISON:  07/28/2017 FINDINGS: Cardiac shadow is stable. Aortic calcifications are again seen. Right chest wall port is now noted. Diffuse vascular congestion with interstitial edema is noted. Left-sided effusion with likely underlying atelectasis is present as well. IMPRESSION: Changes consistent with CHF with left basilar atelectasis and effusion. Electronically Signed   By: Inez Catalina M.D.   On: 09/09/2017 08:16    Wt Readings from Last 3 Encounters:  09/09/17 80.8 kg (178 lb 2.1 oz)  09/07/17 82 kg (180 lb 12.8 oz)  08/31/17 80.7 kg (178 lb)    EKG: sinus tachycardia with lbbb  Physical Exam:acutely ill male  Blood pressure (!) 143/76, pulse 88, temperature 98.6 F (37 C), temperature source Axillary, resp. rate (!) 21, height 5\' 10"  (1.778 m), weight 80.8 kg (178 lb 2.1 oz), SpO2 97 %. Body mass index is 25.56 kg/m. General: Well developed, well nourished, in no acute distress. Head: Normocephalic, atraumatic, sclera non-icteric, no  xanthomas, nares are without discharge.  Neck: Negative for carotid bruits. JVD not elevated. Lungs: Bilateral rales.  Heart: RRR with S1 S2. No murmurs, rubs, or gallops appreciated. Abdomen: Soft, non-tender, non-distended with normoactive bowel sounds. No hepatomegaly. No rebound/guarding. No obvious abdominal masses. Msk:  Strength and tone appear normal for age. Extremities: No clubbing or cyanosis. No edema.  Distal pedal pulses are 2+ and equal bilaterally. Neuro: Alert and oriented X 3. No facial asymmetry. No focal deficit. Moves all extremities spontaneously. Psych:  Responds to questions appropriately with a normal affect.     Assessment and Plan  chf-hypoxic requiring bipap. CXR reveals pulmonary edema. Has preserved lv funciton by cardac mri recently. He has rued out for an mi thus far. Will agressivly diurese and follow output and electrolytes.   CAD-no evidence of ischmia on ekg. Will continue to rule out.  Continue with asa, plavix, carvedilol.  HTNp continue carvedilol, amlodipine.   Signed, Teodoro Spray MD 09/09/2017, 9:00 PM Pager: [phone]

## 2017-09-09 NOTE — ED Notes (Signed)
All labs sent at this time. RN will monitor.

## 2017-09-09 NOTE — ED Notes (Signed)
Pt alert and oriented, pt moved to room 34 in the ER awaiting admission assignment. Pt requesting water at this time

## 2017-09-09 NOTE — ED Notes (Signed)
Date and time results received: 09/09/17 "8:52 AM" (use smartphrase ".now" to insert current time)  Test: Trop Critical Value: 0.03 Name of Provider Notified: Alphonzo Dublin

## 2017-09-09 NOTE — Progress Notes (Signed)
Attempted to place patient on 3l [place] and patient last only a few minutes before asking to go back on bipap due to increased sob. Placed patient back on at previous settings.

## 2017-09-09 NOTE — ED Triage Notes (Addendum)
55 male presents today via [facility] from  home resp distress since  3am  crackles through  18g RAC 20 g   77%   on arrival pitting edema 120  ST  84% on CPAP  1.5 nitro  1.25 analpril 208 palpates  Lung CA tx today at 1300   Pt presented with [facility] a Cath accessed already.

## 2017-09-09 NOTE — ED Notes (Signed)
Respiratory called to transport pt to ICU

## 2017-09-09 NOTE — ED Notes (Signed)
Dr. Ubaldo Glassing at bedside

## 2017-09-10 ENCOUNTER — Inpatient Hospital Stay
Admit: 2017-09-10 | Discharge: 2017-09-10 | Disposition: A | Discharge status: Home / Self Care | Payer: Medicare Other | Attending: Internal Medicine | Admitting: Internal Medicine

## 2017-09-10 DIAGNOSIS — J810 Acute pulmonary edema: Secondary | ICD-10-CM

## 2017-09-10 LAB — BASIC METABOLIC PANEL
Anion gap: 8 (ref 5–15)
BUN: 27 mg/dL — ABNORMAL HIGH (ref 8–23)
CHLORIDE: 101 mmol/L (ref 98–111)
CO2: 31 mmol/L (ref 22–32)
CREATININE: 1.08 mg/dL (ref 0.61–1.24)
Calcium: 8.3 mg/dL — ABNORMAL LOW (ref 8.9–10.3)
GFR, EST NON AFRICAN AMERICAN: 59 mL/min — AB (ref >60)
Glucose, Bld: 106 mg/dL — ABNORMAL HIGH (ref 70–99)
POTASSIUM: 4.3 mmol/L (ref 3.5–5.1)
Sodium: 140 mmol/L (ref 135–145)

## 2017-09-10 LAB — TROPONIN I: Troponin I: 0.29 ng/mL (ref <0.03)

## 2017-09-10 LAB — CBC
HCT: 30.9 % — ABNORMAL LOW (ref 40.0–52.0)
Hemoglobin: 10.8 g/dL — ABNORMAL LOW (ref 13.0–18.0)
MCH: 34.2 pg — ABNORMAL HIGH (ref 26.0–34.0)
MCHC: 34.8 g/dL (ref 32.0–36.0)
MCV: 98.1 fL (ref 80.0–100.0)
PLATELETS: 479 10*3/uL — AB (ref 150–440)
RBC: 3.15 MIL/uL — AB (ref 4.40–5.90)
RDW: 14.5 % (ref 11.5–14.5)
WBC: 9.1 10*3/uL (ref 3.8–10.6)

## 2017-09-10 LAB — PROCALCITONIN: PROCALCITONIN: 0.2 ng/mL

## 2017-09-10 MED ORDER — VANCOMYCIN HCL IN DEXTROSE 1-5 GM/200ML-% IV SOLN
1000.0000 mg | Freq: Once | INTRAVENOUS | Status: AC
  Administered 2017-09-10: 1000 mg via INTRAVENOUS
  Filled 2017-09-10: qty 200

## 2017-09-10 MED ORDER — MORPHINE SULFATE (PF) 2 MG/ML IV SOLN: 2.0000 mg | INTRAVENOUS | Status: DC | PRN

## 2017-09-10 MED ORDER — SODIUM CHLORIDE 0.9 % IV SOLN
1.0000 g | Freq: Two times a day (BID) | INTRAVENOUS | Status: DC
  Administered 2017-09-10 – 2017-09-11 (×3): 1 g via INTRAVENOUS
  Filled 2017-09-10 (×6): qty 1

## 2017-09-10 MED ORDER — VANCOMYCIN HCL IN DEXTROSE 1-5 GM/200ML-% IV SOLN
1000.0000 mg | INTRAVENOUS | Status: DC
  Administered 2017-09-10: 1000 mg via INTRAVENOUS
  Filled 2017-09-10 (×2): qty 200

## 2017-09-10 MED ORDER — IPRATROPIUM-ALBUTEROL 0.5-2.5 (3) MG/3ML IN SOLN
3.0000 mL | Freq: Four times a day (QID) | RESPIRATORY_TRACT | Status: DC
  Administered 2017-09-10 (×2): 3 mL via RESPIRATORY_TRACT
  Filled 2017-09-10 (×3): qty 3

## 2017-09-10 NOTE — Progress Notes (Signed)
[place] [facility] [place] NAME: Phillip Sanchez    MR#:  [id number]  DATE OF BIRTH:  [birth year]  SUBJECTIVE:  CHIEF COMPLAINT:   Chief Complaint  Patient presents with  . Respiratory Distress  Came with respi distress, CHF. Was on Bipap initially, now on nasal canula, improved.  REVIEW OF SYSTEMS:  CONSTITUTIONAL: No fever, fatigue or weakness.  EYES: No blurred or double vision.  EARS, NOSE, AND THROAT: No tinnitus or ear pain.  RESPIRATORY: No cough, have shortness of breath, no wheezing or hemoptysis.  CARDIOVASCULAR: No chest pain, orthopnea, edema.  GASTROINTESTINAL: No nausea, vomiting, diarrhea or abdominal pain.  GENITOURINARY: No dysuria, hematuria.  ENDOCRINE: No polyuria, nocturia,  HEMATOLOGY: No anemia, easy bruising or bleeding SKIN: No rash or lesion. MUSCULOSKELETAL: No joint pain or arthritis.   NEUROLOGIC: No tingling, numbness, weakness.  PSYCHIATRY: No anxiety or depression.   ROS  DRUG ALLERGIES:  No Known Allergies  VITALS:  Blood pressure 129/65, pulse 90, temperature 99.4 F (37.4 C), temperature source Oral, resp. rate 18, height 5\' 10"  (1.778 m), weight 80.8 kg (178 lb 2.1 oz), SpO2 94 %.  PHYSICAL EXAMINATION:  GENERAL:  84 y.o.-year-old patient lying in the bed with no acute distress.  EYES: Pupils equal, round, reactive to light and accommodation. No scleral icterus. Extraocular muscles intact.  HEENT: Head atraumatic, normocephalic. Oropharynx and nasopharynx clear.  NECK:  Supple, no jugular venous distention. No thyroid enlargement, no tenderness.  LUNGS: Normal breath sounds bilaterally, no wheezing, some crepitation. No use of accessory muscles of respiration.  CARDIOVASCULAR: S1, S2 normal. No murmurs, rubs, or gallops.  ABDOMEN: Soft, nontender, nondistended. Bowel sounds present. No organomegaly or mass.  EXTREMITIES: No pedal edema, cyanosis, or clubbing.  NEUROLOGIC: Cranial nerves II through XII  are intact. Muscle strength 5/5 in all extremities. Sensation intact. Gait not checked.  PSYCHIATRIC: The patient is alert and oriented x 3.  SKIN: No obvious rash, lesion, or ulcer.   Physical Exam LABORATORY PANEL:   CBC Recent Labs  Lab 09/10/17 0639  WBC 9.1  HGB 10.8*  HCT 30.9*  PLT 479*   ------------------------------------------------------------------------------------------------------------------  Chemistries  Recent Labs  Lab 09/09/17 0801 09/10/17 0639  NA 140 140  K 4.6 4.3  CL 105 101  CO2 26 31  GLUCOSE 191* 106*  BUN 23 27*  CREATININE 1.14 1.08  CALCIUM 8.4* 8.3*  AST 61*  --   ALT 22  --   ALKPHOS 101  --   BILITOT 1.0  --    ------------------------------------------------------------------------------------------------------------------  Cardiac Enzymes Recent Labs  Lab 09/09/17 0801 09/10/17 0639  TROPONINI 0.03* 0.29*   ------------------------------------------------------------------------------------------------------------------  RADIOLOGY:  Dg Chest Portable 1 View  Result Date: 09/09/2017 CLINICAL DATA:  Respiratory distress EXAM: PORTABLE CHEST 1 VIEW COMPARISON:  07/28/2017 FINDINGS: Cardiac shadow is stable. Aortic calcifications are again seen. Right chest wall port is now noted. Diffuse vascular congestion with interstitial edema is noted. Left-sided effusion with likely underlying atelectasis is present as well. IMPRESSION: Changes consistent with CHF with left basilar atelectasis and effusion. Electronically Signed   By: Inez Catalina M.D.   On: 09/09/2017 08:16    ASSESSMENT AND PLAN:   Principal Problem:   Acute respiratory failure (HCC) Active Problems:   Acute diastolic CHF (congestive heart failure) (HCC)   Acute diastolic (congestive) heart failure (HCC)   Acute pulmonary edema (HCC)   *Acute hypoxic respiratory failure Due to acute diastolic congestive heart failure  required  BiPAP, taper to nasal  cannula. IV Lasix, intake and output measurement. Cardiology consult. Echo- awaited result.  *Hypertension Continue home medications and hydralazine injection as needed.  *History of coronary artery disease Continue aspirin, Plavix, carvedilol, atorvastatin.  *Hyperlipidemia Continue atorvastatin.  *Elevated white blood cell count This could be reactive from his recent chemotherapy Intensivist suggested to treat with Abx as post chemo and immune compromised.  All the records are reviewed and case discussed with Care Management/Social Workerr. Management plans discussed with the patient, family and they are in agreement.  CODE STATUS:full  TOTAL TIME TAKING CARE OF THIS PATIENT: 35 minutes.     POSSIBLE D/C IN 1 DAYS, DEPENDING ON CLINICAL CONDITION.   Vaughan Basta M.D on 09/10/2017   Between 7am to 6pm - Pager - [phone]  After 6pm go to [email or web address] - password [email or web address] [facility]  Office  [phone]  CC: Primary care physician; Idelle Crouch, MD  Note: This dictation was prepared with Dragon dictation along with smaller phrase technology. Any transcriptional errors that result from this process are unintentional.

## 2017-09-10 NOTE — Progress Notes (Signed)
Gave report to MAI, RN

## 2017-09-10 NOTE — Progress Notes (Signed)
Pt A&O Denies pain and SOB at this time.

## 2017-09-10 NOTE — Progress Notes (Signed)
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  Cardiac cath revealed no significant progression of his disease that would require intervention.  Continue with medical management.  He has a history of an ejection fraction of 50%.  He presented to the emergency room with progressive shortness of breath and was found to be hypoxic initially placed on BiPAP.  He has improved overnight is on nasal cannula oxygen today.  Is ruled out for myocardial infarction.  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  This was noted on brain MRI earlier this month.  Subjective   I feel a lot better.  Inpatient Medications    . amLODipine  10 mg Oral Daily   And  . benazepril  40 mg Oral Daily  . aspirin EC  81 mg Oral Daily  . atorvastatin  20 mg Oral QHS  . calcium carbonate  500 mg of elemental calcium Oral Daily  . carvedilol  25 mg Oral BID  . clopidogrel  75 mg Oral Daily  . famotidine  10 mg Oral Daily  . furosemide  20 mg Intravenous Q8H  . heparin  5,000 Units Subcutaneous Q8H  . ipratropium-albuterol  3 mL Nebulization Q6H  . loratadine  10 mg Oral Daily  . mouth rinse  15 mL Mouth Rinse BID  . mometasone-formoterol  2 puff Inhalation BID  . multivitamin with minerals   Oral Daily  . pantoprazole  40 mg Oral Daily  . polyethylene glycol  17 g Oral Daily  . senna  1 tablet Oral Daily    Vital Signs    Vitals:   09/10/17 1000 09/10/17 1100 09/10/17 1200 09/10/17 1300  BP: 115/62 133/66 123/66 (!) 114/59  Pulse: 84 82 83 83  Resp: 19 16 16  18   Temp:      TempSrc:      SpO2: 97% 96% 98% 96%  Weight:      Height:        Intake/Output Summary (Last 24 hours) at 09/10/2017 1401 Last data filed at 09/10/2017 1300 Gross per 24 hour  Intake 33.33 ml  Output 4600 ml  Net -4566.67 ml   Filed Weights   09/09/17 0759 09/09/17 1845  Weight: 81.6 kg (180 lb) 80.8 kg (178 lb 2.1 oz)    Physical Exam    GEN: Well nourished, well developed, in no acute distress.  HEENT: normal.  Neck: Supple, no JVD, carotid bruits, or masses. Cardiac: RRR, no murmurs, rubs, or gallops. No clubbing, cyanosis, edema.  Radials/DP/PT 2+ and equal bilaterally.  Respiratory: Somewhat labored respirations with rhonchi bilaterally. GI: Soft, nontender, nondistended, BS + x 4. MS: no deformity or atrophy. Skin: warm and dry, no rash. Neuro:  Strength and sensation are intact. Psych: Normal affect.  Labs    CBC Recent Labs    09/09/17 0801 09/10/17 0639  WBC 22.6* 9.1  HGB 12.4* 10.8*  HCT 36.6* 30.9*  MCV 100.3* 98.1  PLT 654* 176*   Basic Metabolic Panel Recent Labs    09/09/17 0801  09/10/17 0639  NA 140 140  K 4.6 4.3  CL 105 101  CO2 26 31  GLUCOSE 191* 106*  BUN 23 27*  CREATININE 1.14 1.08  CALCIUM 8.4* 8.3*   Liver Function Tests Recent Labs    09/09/17 0801  AST 61*  ALT 22  ALKPHOS 101  BILITOT 1.0  PROT 7.3  ALBUMIN 3.4*   No results for input(s): LIPASE, AMYLASE in the last 72 hours. Cardiac Enzymes Recent Labs    09/09/17 0801 09/10/17 0639  TROPONINI 0.03* 0.29*   BNP Recent Labs    09/09/17 0802  BNP 458.0*   D-Dimer No results for input(s): DDIMER in the last 72 hours. Hemoglobin A1C No results for input(s): HGBA1C in the last 72 hours. Fasting Lipid Panel No results for input(s): CHOL, HDL, LDLCALC, TRIG, CHOLHDL, LDLDIRECT in the last 72 hours. Thyroid Function Tests No results for input(s): TSH, T4TOTAL, T3FREE, THYROIDAB in the last 72 hours.  Invalid input(s): FREET3  Telemetry     Sinus tachycardia with left bundle branch block  ECG    Sinus tachycardia with left bundle branch block  Radiology    Mr Jeri Cos Wo Contrast  Result Date: 08/17/2017 CLINICAL DATA:  Small cell lung cancer. Staging. No neurologic symptoms. EXAM: MRI HEAD WITHOUT AND WITH CONTRAST TECHNIQUE: Multiplanar, multiecho pulse sequences of the brain and surrounding structures were obtained without and with intravenous contrast. CONTRAST:  38mL MULTIHANCE GADOBENATE DIMEGLUMINE 529 MG/ML IV SOLN COMPARISON:  PET scan 01/23/2014. CT abdomen and pelvis 07/26/2017. MR head 09/03/2008. FINDINGS: Brain: No evidence for acute infarction, hemorrhage, hydrocephalus, or extra-axial fluid. Generalized atrophy, not unexpected for age. Moderate T2 and FLAIR hyperintensities throughout periventricular and subcortical white matter, likely small vessel disease. Post infusion, there are two visible metastatic lesions. There is a 5 mm metastasis to the LEFT cerebellar peduncle. Only minimal surrounding edema. No midline shift. No mass effect on the fourth ventricle. A second metastatic lesion, 4 mm, is observed in the inferior temporal lobe, just lateral and inferior to the ventricle. Again no significant mass effect, and only minimal surrounding edema. No other definite metastatic lesions can be seen. Vascular: Normal flow voids. Skull and upper cervical spine: No definite skull base or upper cervical lesions. Mild pannus surrounds the odontoid. Sinuses/Orbits: Negative Other: None. 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 No significant mass effect or edema. Mild atrophy.  Moderate small vessel disease. A call has been placed to the ordering provider. Electronically Signed   By: Staci Righter M.D.   On: 08/17/2017 12:43   Dg Chest Portable 1 View  Result Date: 09/09/2017 CLINICAL DATA:  Respiratory distress EXAM:  PORTABLE CHEST 1 VIEW COMPARISON:  07/28/2017 FINDINGS: Cardiac shadow is stable. Aortic calcifications are again seen. Right chest wall port is now noted. Diffuse vascular congestion with interstitial edema is noted. Left-sided effusion with likely underlying atelectasis is present as well. IMPRESSION: Changes consistent with CHF with left basilar atelectasis and effusion. Electronically Signed   By: Inez Catalina M.D.   On: 09/09/2017 08:16    Assessment & Plan     CHF-clinically improved off of BiPAP on nasal cannula oxygen.  Ruled out for myocardial infarction.  Will recommend transfer to telemetry continue follow-up.  Echocardiogram today reveals near normal LV function EF to 50 to 55%.  No appreciable change from previous evaluation.  Coronary artery disease-appears stable clinically.  Is ruled  out for myocardial infarction.  We will continue to follow on current regimen  Lung carcinoma.  Status post 2 courses of chemotherapy scheduled to have his third dose on day of admission.  Hypoxia has cleared.  Continue careful diuresis and bronchodilators.      Signed, Javier Docker Fath MD 09/10/2017, 2:01 PM  Pager: [phone]

## 2017-09-10 NOTE — Progress Notes (Signed)
Agree with previous assessment. Assumed care of pt. Refer to focused assessment. C/o abd cramping/constipation. MD informed of pt's wish for enema. Ordered soap suds. Tolerated procedure well. Large hard BM noted. No complaints afterwards.

## 2017-09-10 NOTE — Progress Notes (Signed)
Pharmacy Antibiotic Note  Phillip Sanchez is a 84 y.o. male with a h/o stage IV lung cancer admitted on 09/09/2017 with acute respiratory failure.  Pharmacy has been consulted for vancomycin dosing. Patient is also ordered cefepime.   Plan: Vancomycin 1000 mg iv once followed by 1000 mg iv q 18 hours with stacked dosing.   Cefepime renally adjusted from 1 g iv q 8 hours to 1 g iv q 12 hours.   Height: 5\' 10"  (177.8 cm) Weight: 178 lb 2.1 oz (80.8 kg) IBW/kg (Calculated) : 73  Temp (24hrs), Avg:98.4 F (36.9 C), Min:98.1 F (36.7 C), Max:98.7 F (37.1 C)  Recent Labs  Lab 09/07/17 0905 09/09/17 0801 09/10/17 0639  WBC 13.5* 22.6* 9.1  CREATININE 0.99 1.14 1.08    Estimated Creatinine Clearance: 48.8 mL/min (by C-G formula based on SCr of 1.08 mg/dL).    No Known Allergies  Antimicrobials this admission: cefepime 7/24 >>  vancomycin 7/27 >>   Dose adjustments this admission:   Microbiology results: 7/27 BCx:  7/26 MRSA PCR: negative  Thank you for allowing pharmacy to be a part of this patient's care.  Ulice Dash D 09/10/2017 9:52 AM

## 2017-09-11 LAB — ECHOCARDIOGRAM COMPLETE
Height: 70 in
WEIGHTICAEL: 2850.11 [oz_av]

## 2017-09-11 MED ORDER — IPRATROPIUM-ALBUTEROL 0.5-2.5 (3) MG/3ML IN SOLN: 3.0000 mL | Freq: Two times a day (BID) | RESPIRATORY_TRACT | Status: DC

## 2017-09-11 MED ORDER — IPRATROPIUM-ALBUTEROL 0.5-2.5 (3) MG/3ML IN SOLN: 3.0000 mL | Freq: Four times a day (QID) | RESPIRATORY_TRACT | Status: DC | PRN

## 2017-09-11 MED ORDER — AMOXICILLIN-POT CLAVULANATE 875-125 MG PO TABS: 1.0000 | ORAL_TABLET | Freq: Two times a day (BID) | ORAL | 0 refills | Status: AC

## 2017-09-11 MED ORDER — IPRATROPIUM-ALBUTEROL 0.5-2.5 (3) MG/3ML IN SOLN
3.0000 mL | Freq: Three times a day (TID) | RESPIRATORY_TRACT | Status: DC
  Administered 2017-09-11: 3 mL via RESPIRATORY_TRACT
  Filled 2017-09-11: qty 3

## 2017-09-11 NOTE — Progress Notes (Signed)
Discharge instructions reviewed with patient and family. Printed prescription given to patient. All questions answered. AVS given to patient. IV removed. Patient was escorted out via wheelchair.

## 2017-09-11 NOTE — Care Management Note (Signed)
Case Management Note  Patient Details  Name: Phillip Sanchez MRN: [id number] Date of Birth: 1928/01/11  Subjective/Objective:    Patient to be discharged per MD order. Patient currently open with [facility]. Referral placed with Jermaine who agrees to accept the case for resumption. Orders in for PT and nursing services. Patient in agreement with this discharge planning. Family to provide transport.               Ines Bloomer RN BSN RNCM [phone]   Action/Plan:   Expected Discharge Date:  09/11/17               Expected Discharge Plan:     In-House Referral:     Discharge planning Services  CM Consult  Post Acute Care Choice:  Resumption of Svcs/PTA Provider Choice offered to:     DME Arranged:    DME Agency:     HH Arranged:  RN, PT [place] Agency:  [facility]  Status of Service:  Completed, signed off  If discussed at [place] of Stay Meetings, dates discussed:    Additional Comments:  Beautifull Cisar A Ronna Herskowitz, RN 09/11/2017, 11:02 AM

## 2017-09-12 ENCOUNTER — Inpatient Hospital Stay: Payer: Medicare Other

## 2017-09-12 DIAGNOSIS — C3490 Malignant neoplasm of unspecified part of unspecified bronchus or lung: Secondary | ICD-10-CM

## 2017-09-12 MED ORDER — PEGFILGRASTIM-CBQV 6 MG/0.6ML ~~LOC~~ SOSY: 6.0000 mg | PREFILLED_SYRINGE | Freq: Once | SUBCUTANEOUS | Status: DC

## 2017-09-13 NOTE — Discharge Summary (Signed)
[facility] [place] NAME: Phillip Sanchez    MR#:  [id number]  DATE OF BIRTH:  [birth year]  DATE OF ADMISSION:  09/09/2017 ADMITTING PHYSICIAN: Vaughan Basta, MD  DATE OF DISCHARGE: 09/11/2017 12:00 PM  PRIMARY CARE PHYSICIAN: Idelle Crouch, MD    ADMISSION DIAGNOSIS:  Acute pulmonary edema ([place]) [J81.0] Acute on chronic congestive heart failure, unspecified heart failure type ([place]) [I50.9]  DISCHARGE DIAGNOSIS:  Principal Problem:   Acute respiratory failure (HCC) Active Problems:   Acute diastolic CHF (congestive heart failure) (HCC)   Acute diastolic (congestive) heart failure (HCC)   Acute pulmonary edema ([place])   SECONDARY DIAGNOSIS:   Past Medical History:  Diagnosis Date  . Anemia   . CHF (congestive heart failure) ([place])   . Colon cancer [facility])    He states they removed cancerous polyps.  . Hypercholesteremia   . Hypertension   . Lung cancer ([place])   . Myocardial infarction ([place])   . Osteoarthritis   . Prostate cancer [facility])    Prostatectomy.  . Small cell lung cancer ([place]) 08/04/2017    HOSPITAL COURSE:    *Acute hypoxic respiratory failure Due to acute diastolic congestive heart failure  required BiPAP, taper to nasal cannula. IV Lasix, intake and output measurement. Cardiology consult.Echo- EF 45%.  *Hypertension Continue home medications and hydralazine injection as needed.  *History of coronary artery disease Continue aspirin, Plavix, carvedilol, atorvastatin.  *Hyperlipidemia Continue atorvastatin.  *Elevated white blood cell count This could be reactive from his recent chemotherapy Intensivist suggested to treat with Abx as post chemo and immune compromised.    DISCHARGE CONDITIONS:   Stable.  CONSULTS OBTAINED:  Treatment Team:  Teodoro Spray, MD  DRUG ALLERGIES:  No Known Allergies  DISCHARGE MEDICATIONS:   Allergies as of 09/11/2017   No Known Allergies      Medication List    STOP taking these medications   amoxicillin 500 MG capsule Commonly known as:  AMOXIL   dronabinol 5 MG capsule Commonly known as:  MARINOL     TAKE these medications   amLODipine-benazepril 10-40 MG capsule Commonly known as:  LOTREL Take 1 capsule by mouth daily.   amoxicillin-clavulanate 875-125 MG tablet Commonly known as:  AUGMENTIN Take 1 tablet by mouth 2 (two) times daily for 3 days.   aspirin EC 81 MG tablet Take 81 mg by mouth daily.   atorvastatin 20 MG tablet Commonly known as:  LIPITOR Take 20 mg by mouth at bedtime.   calcium carbonate 1250 (500 Ca) MG tablet Commonly known as:  OS-CAL - dosed in mg of elemental calcium Take 1 tablet by mouth daily.   carvedilol 25 MG tablet Commonly known as:  COREG Take 25 mg by mouth 2 (two) times daily.   cetirizine 10 MG tablet Commonly known as:  ZYRTEC Take 1 tablet by mouth daily.   clopidogrel 75 MG tablet Commonly known as:  PLAVIX Take 75 mg by mouth daily.   docusate sodium 100 MG capsule Commonly known as:  COLACE Take 1 capsule (100 mg total) by mouth daily as needed.   esomeprazole 20 MG capsule Commonly known as:  NEXIUM Take 1 capsule (20 mg total) by mouth daily.   fexofenadine-pseudoephedrine 180-240 MG 24 hr tablet Commonly known as:  ALLEGRA-D 24 Take 1 tablet by mouth daily as needed (for allergies).   fluticasone 50 MCG/ACT nasal spray Commonly known as:  FLONASE Place 1 spray into the nose daily  as needed for allergies or rhinitis.   Fluticasone-Salmeterol 500-50 MCG/DOSE Aepb Commonly known as:  ADVAIR DISKUS Inhale 1 puff into the lungs 2 (two) times daily.   furosemide 20 MG tablet Commonly known as:  LASIX Take 1 tablet (20 mg total) by mouth daily.   lidocaine-prilocaine cream Commonly known as:  EMLA Apply to affected area once   MULTI-VITAMINS Tabs Take 1 tablet by mouth daily.   ondansetron 8 MG tablet Commonly known as:  ZOFRAN Take 1  tablet (8 mg total) by mouth 2 (two) times daily as needed for refractory nausea / vomiting. Start on day 3 after carboplatin chemo.   Oxycodone HCl 10 MG Tabs Take 1 tablet (10 mg total) by mouth every 6 (six) hours as needed.   polyethylene glycol packet Commonly known as:  MIRALAX / GLYCOLAX Take 17 g by mouth daily.   potassium chloride 10 MEQ tablet Commonly known as:  K-DUR Take 1 tablet (10 mEq total) by mouth daily.   prochlorperazine 10 MG tablet Commonly known as:  COMPAZINE Take 1 tablet (10 mg total) by mouth every 6 (six) hours as needed for nausea or vomiting.   ranitidine 300 MG tablet Commonly known as:  ZANTAC Take 300 mg by mouth at bedtime.   rOPINIRole 1 MG tablet Commonly known as:  REQUIP Take 1 mg by mouth at bedtime.   senna 8.6 MG Tabs tablet Commonly known as:  SENOKOT Take 1 tablet (8.6 mg total) by mouth daily.   temazepam 15 MG capsule Commonly known as:  RESTORIL Take 15-30 mg by mouth at bedtime as needed for sleep.        DISCHARGE INSTRUCTIONS:    Follow with cardiology.  If you experience worsening of your admission symptoms, develop shortness of breath, life threatening emergency, suicidal or homicidal thoughts you must seek medical attention immediately by calling 911 or calling your MD immediately  if symptoms less severe.  You Must read complete instructions/literature along with all the possible adverse reactions/side effects for all the Medicines you take and that have been prescribed to you. Take any new Medicines after you have completely understood and accept all the possible adverse reactions/side effects.   Please note  You were cared for by a hospitalist during your hospital stay. If you have any questions about your discharge medications or the care you received while you were in the hospital after you are discharged, you can call the unit and asked to speak with the hospitalist on call if the hospitalist that took care of  you is not available. Once you are discharged, your primary care physician will handle any further medical issues. Please note that NO REFILLS for any discharge medications will be authorized once you are discharged, as it is imperative that you return to your primary care physician (or establish a relationship with a primary care physician if you do not have one) for your aftercare needs so that they can reassess your need for medications and monitor your lab values.    Today   CHIEF COMPLAINT:   Chief Complaint  Patient presents with  . Respiratory Distress    HISTORY OF PRESENT ILLNESS:  Phillip Sanchez  is a 84 y.o. male with a known history of anemia, CHF, colon cancer, hypercholesterolemia, hypertension, lung cancer, MI, prostate cancer, small cell lung cancer-has been feeling progressively shortness of breath for last few weeks also tried increasing dose of Lasix by primary care physician. He has to use 2-3 pillows at  night and have worsening swelling on the legs. He received 2 course of his chemotherapy and today was supposed to go to cancer center to have a third dose of chemo but was significantly short of breath and having chest tightness so came to emergency room. Noted to have CHF and Hypoxia up to 70% in ER, started on BiPAP and felt better so ER suggested to admit to medical services for acute CHF.   VITAL SIGNS:  Blood pressure 130/66, pulse 86, temperature 99.2 F (37.3 C), temperature source Oral, resp. rate 18, height 5\' 10"  (1.778 m), weight 80.8 kg (178 lb 2.1 oz), SpO2 95 %.  I/O:  No intake or output data in the 24 hours ending 09/13/17 0004  PHYSICAL EXAMINATION:   GENERAL:  84 y.o.-year-old patient lying in the bed with no acute distress.  EYES: Pupils equal, round, reactive to light and accommodation. No scleral icterus. Extraocular muscles intact.  HEENT: Head atraumatic, normocephalic. Oropharynx and nasopharynx clear.  NECK:  Supple, no jugular venous  distention. No thyroid enlargement, no tenderness.  LUNGS: Normal breath sounds bilaterally, no wheezing, some crepitation. No use of accessory muscles of respiration.  CARDIOVASCULAR: S1, S2 normal. No murmurs, rubs, or gallops.  ABDOMEN: Soft, nontender, nondistended. Bowel sounds present. No organomegaly or mass.  EXTREMITIES: No pedal edema, cyanosis, or clubbing.  NEUROLOGIC: Cranial nerves II through XII are intact. Muscle strength 5/5 in all extremities. Sensation intact. Gait not checked.  PSYCHIATRIC: The patient is alert and oriented x 3.  SKIN: No obvious rash, lesion, or ulcer.     DATA REVIEW:   CBC Recent Labs  Lab 09/10/17 0639  WBC 9.1  HGB 10.8*  HCT 30.9*  PLT 479*    Chemistries  Recent Labs  Lab 09/09/17 0801 09/10/17 0639  NA 140 140  K 4.6 4.3  CL 105 101  CO2 26 31  GLUCOSE 191* 106*  BUN 23 27*  CREATININE 1.14 1.08  CALCIUM 8.4* 8.3*  AST 61*  --   ALT 22  --   ALKPHOS 101  --   BILITOT 1.0  --     Cardiac Enzymes Recent Labs  Lab 09/10/17 0639  TROPONINI 0.29*    Microbiology Results  Results for orders placed or performed during the hospital encounter of 09/09/17  MRSA PCR Screening     Status: None   Collection Time: 09/09/17  6:44 PM  Result Value Ref Range Status   MRSA by PCR NEGATIVE NEGATIVE Final    Comment:        The GeneXpert MRSA Assay (FDA approved for NASAL specimens only), is one component of a comprehensive MRSA colonization surveillance program. It is not intended to diagnose MRSA infection nor to guide or monitor treatment for MRSA infections. Performed at [facility], [address] [place], [place] [address]   Culture, blood (Routine X 2) w Reflex to ID Panel     Status: None (Preliminary result)   Collection Time: 09/10/17  8:47 AM  Result Value Ref Range Status   Specimen Description BLOOD [facility]  Final   Special Requests   Final    BOTTLES DRAWN AEROBIC AND ANAEROBIC Blood Culture  adequate volume   Culture   Final    NO GROWTH 2 DAYS Performed at [facility], [address] [place], [place] [address]    Report Status PENDING  Incomplete  Culture, blood (Routine X 2) w Reflex to ID Panel     Status: None (Preliminary  result)   Collection Time: 09/10/17  8:47 AM  Result Value Ref Range Status   Specimen Description BLOOD LAC  Final   Special Requests   Final    BOTTLES DRAWN AEROBIC AND ANAEROBIC Blood Culture results may not be optimal due to an excessive volume of blood received in culture bottles   Culture   Final    NO GROWTH 2 DAYS Performed at [facility], [address], [place], [place] [address]    Report Status PENDING  Incomplete    RADIOLOGY:  No results found.  EKG:   Orders placed or performed during the hospital encounter of 09/09/17  . EKG      Management plans discussed with the patient, family and they are in agreement.  CODE STATUS:  Code Status History    Date Active Date Inactive Code Status Order ID Comments User Context   09/09/2017 1639 09/11/2017 1536 Full Code [id number]  Vaughan Basta, MD ED   07/28/2017 1554 07/31/2017 1706 DNR [id number]  Max Sane, MD Inpatient   07/26/2017 1552 07/28/2017 1554 Full Code [id number]  Harrie Foreman, MD Inpatient    Advance Directive Documentation     Most Recent Value  Type of Advance Directive  Living will, Healthcare Power of Attorney  Pre-existing out of facility DNR order (yellow form or pink MOST form)  -  "MOST" Form in Place?  -      TOTAL TIME TAKING CARE OF THIS PATIENT: 35 minutes.    Vaughan Basta M.D on 09/13/2017 at 12:04 AM  Between 7am to 6pm - Pager - [phone]  After 6pm go to [email or web address] - password [email or web address]  Office  [phone]  CC: Primary care physician; Idelle Crouch, MD   Note: This dictation was prepared with Dragon dictation along with smaller phrase technology. Any  transcriptional errors that result from this process are unintentional.

## 2017-09-14 ENCOUNTER — Telehealth: Payer: Self-pay

## 2017-09-14 NOTE — Telephone Encounter (Signed)
EMMI Follow-up: Noted on the report that the patient hadn't read discharge paperwork yet and didn't know who to contact if there was a change in condition.  I talked with Mr. Kerce (and daughter via speaker phone) and she said her parents don't answer automated calls due to hearing issues. I explained the reason for the automated calls but if they didn't answer and still have questions to feel free to call me with any questions.  Mr. Lasch said he was still having trouble breathing but using the inhaler prescribed and it is helping. Mr. Weisel has a follow-up appointment on Friday and I advised him to talk with his PCP regarding the breathing issue. He is aware of lab work and other appointments. No other needs noted for today.

## 2017-09-15 LAB — CULTURE, BLOOD (ROUTINE X 2)
CULTURE: NO GROWTH
Culture: NO GROWTH
Special Requests: ADEQUATE

## 2017-09-16 ENCOUNTER — Other Ambulatory Visit: Payer: Self-pay

## 2017-09-16 ENCOUNTER — Inpatient Hospital Stay (HOSPITAL_BASED_OUTPATIENT_CLINIC_OR_DEPARTMENT_OTHER): Payer: Medicare Other | Admitting: Internal Medicine

## 2017-09-16 ENCOUNTER — Inpatient Hospital Stay: Payer: Medicare Other | Attending: Internal Medicine

## 2017-09-16 ENCOUNTER — Inpatient Hospital Stay: Payer: Medicare Other

## 2017-09-16 ENCOUNTER — Other Ambulatory Visit: Payer: Self-pay | Admitting: *Deleted

## 2017-09-16 VITALS — BP 129/84 | HR 75 | Temp 97.2°F | Resp 20 | Ht 70.0 in | Wt 178.9 lb

## 2017-09-16 DIAGNOSIS — C3490 Malignant neoplasm of unspecified part of unspecified bronchus or lung: Secondary | ICD-10-CM

## 2017-09-16 DIAGNOSIS — I509 Heart failure, unspecified: Secondary | ICD-10-CM | POA: Diagnosis not present

## 2017-09-16 DIAGNOSIS — Z5189 Encounter for other specified aftercare: Secondary | ICD-10-CM | POA: Diagnosis not present

## 2017-09-16 DIAGNOSIS — C801 Malignant (primary) neoplasm, unspecified: Secondary | ICD-10-CM

## 2017-09-16 DIAGNOSIS — C3432 Malignant neoplasm of lower lobe, left bronchus or lung: Secondary | ICD-10-CM | POA: Diagnosis not present

## 2017-09-16 DIAGNOSIS — Z85038 Personal history of other malignant neoplasm of large intestine: Secondary | ICD-10-CM

## 2017-09-16 DIAGNOSIS — Z5112 Encounter for antineoplastic immunotherapy: Secondary | ICD-10-CM | POA: Insufficient documentation

## 2017-09-16 DIAGNOSIS — Z8546 Personal history of malignant neoplasm of prostate: Secondary | ICD-10-CM

## 2017-09-16 DIAGNOSIS — Z87891 Personal history of nicotine dependence: Secondary | ICD-10-CM

## 2017-09-16 DIAGNOSIS — C787 Secondary malignant neoplasm of liver and intrahepatic bile duct: Secondary | ICD-10-CM | POA: Insufficient documentation

## 2017-09-16 DIAGNOSIS — Z5111 Encounter for antineoplastic chemotherapy: Secondary | ICD-10-CM | POA: Diagnosis not present

## 2017-09-16 LAB — BASIC METABOLIC PANEL
Anion gap: 8 (ref 5–15)
BUN: 20 mg/dL (ref 8–23)
CHLORIDE: 104 mmol/L (ref 98–111)
CO2: 24 mmol/L (ref 22–32)
Calcium: 8.6 mg/dL — ABNORMAL LOW (ref 8.9–10.3)
Creatinine, Ser: 0.81 mg/dL (ref 0.61–1.24)
GFR calc non Af Amer: >60 mL/min (ref >60)
Glucose, Bld: 165 mg/dL — ABNORMAL HIGH (ref 70–99)
Potassium: 3.8 mmol/L (ref 3.5–5.1)
Sodium: 136 mmol/L (ref 135–145)

## 2017-09-16 LAB — CBC WITH DIFFERENTIAL/PLATELET
BASOS PCT: 10 %
Band Neutrophils: 0 %
Basophils Absolute: 0.4 10*3/uL — ABNORMAL HIGH (ref 0–0.1)
Blasts: 0 %
EOS PCT: 4 %
Eosinophils Absolute: 0.2 10*3/uL (ref 0–0.7)
HCT: 28.6 % — ABNORMAL LOW (ref 40.0–52.0)
Hemoglobin: 9.9 g/dL — ABNORMAL LOW (ref 13.0–18.0)
LYMPHS ABS: 0.7 10*3/uL — AB (ref 1.0–3.6)
Lymphocytes Relative: 18 %
MCH: 34.1 pg — ABNORMAL HIGH (ref 26.0–34.0)
MCHC: 34.7 g/dL (ref 32.0–36.0)
MCV: 98.3 fL (ref 80.0–100.0)
MONO ABS: 0.2 10*3/uL (ref 0.2–1.0)
MONOS PCT: 4 %
Metamyelocytes Relative: 0 %
Myelocytes: 0 %
NEUTROS ABS: 2.5 10*3/uL (ref 1.4–6.5)
NEUTROS PCT: 64 %
NRBC: 0 /100{WBCs}
Other: 0 %
PLATELETS: 242 10*3/uL (ref 150–440)
Promyelocytes Relative: 0 %
RBC: 2.91 MIL/uL — ABNORMAL LOW (ref 4.40–5.90)
RDW: 14.5 % (ref 11.5–14.5)
WBC: 4 10*3/uL (ref 3.8–10.6)

## 2017-09-16 MED ORDER — SODIUM CHLORIDE 0.9% FLUSH
10.0000 mL | INTRAVENOUS | Status: DC | PRN
  Administered 2017-09-16: 10 mL via INTRAVENOUS
  Filled 2017-09-16: qty 10

## 2017-09-16 MED ORDER — HEPARIN SOD (PORK) LOCK FLUSH 100 UNIT/ML IV SOLN
500.0000 [IU] | Freq: Once | INTRAVENOUS | Status: AC
  Administered 2017-09-16: 500 [IU] via INTRAVENOUS
  Filled 2017-09-16: qty 5

## 2017-09-16 MED ORDER — POTASSIUM CHLORIDE ER 20 MEQ PO TBCR: 20.0000 meq | EXTENDED_RELEASE_TABLET | Freq: Every day | ORAL | 3 refills | Status: AC

## 2017-09-16 MED ORDER — FUROSEMIDE 20 MG PO TABS: 20.0000 mg | ORAL_TABLET | Freq: Two times a day (BID) | ORAL | 3 refills | Status: AC

## 2017-09-16 NOTE — Progress Notes (Signed)
noted improvement of lower extremity with compression stocking and use of lasix.

## 2017-09-16 NOTE — Progress Notes (Signed)
[facility] OFFICE PROGRESS NOTE  Patient Care Team: Idelle Crouch, MD as PCP - General (Internal Medicine) Telford Nab, RN as Registered Nurse Fath, Javier Docker, MD as Consulting Physician (Cardiology) Cammie Sickle, MD as Medical Oncologist (Medical Oncology)  Cancer Staging No matching staging information was found for the patient.   Oncology History   # 2015- [facility] [place] ca stage I s/p SBRT [Dr.Crystal]  # 3rd June 2019- Multiple liver lesions- s/p Bx- SMALL CELL of lung origin; STAGE IV;  # June 26th- carbo-etop-atezo  # Prostate cancer [ 35 years ago]; s/p surgery  # colon cancer [s/p resection of malignant polyps; Dr/Elliot; No colectomy]  # hx CAD [asprin/plavix]; hx PVD  -----------------------------------------------   DIAGNOSIS: Small cell lung cancer  STAGE:  IV     ;GOALS: Palliative  CURRENT/MOST RECENT THERAPY: carbo-etop-Atezo      [name]    Small cell lung cancer ([place])   08/04/2017 Initial Diagnosis    Small cell lung cancer ([place])    08/04/2017 -  Chemotherapy    The patient had palonosetron (ALOXI) injection 0.25 mg, 0.25 mg, Intravenous,  Once, 2 of 4 cycles Administration: 0.25 mg (08/10/2017), 0.25 mg (09/07/2017) pegfilgrastim-cbqv (UDENYCA) injection 6 mg, 6 mg, Subcutaneous, Once, 2 of 4 cycles Administration: 6 mg (08/15/2017) CARBOplatin (PARAPLATIN) 340 mg in sodium chloride 0.9 % 250 mL chemo infusion, 340 mg (100 % of original dose 340.5 mg), Intravenous,  Once, 2 of 4 cycles Dose modification:   (original dose 340.5 mg, Cycle 1),   (original dose 431 mg, Cycle 2) Administration: 340 mg (08/10/2017), 430 mg (09/07/2017) etoposide (VEPESID) 200 mg in sodium chloride 0.9 % 500 mL chemo infusion, 210 mg, Intravenous,  Once, 2 of 4 cycles Administration: 200 mg (08/10/2017), 200 mg (08/11/2017), 200 mg (08/12/2017), 200 mg (09/07/2017), 200 mg (09/08/2017) fosaprepitant (EMEND) 150 mg,  dexamethasone (DECADRON) 12 mg in sodium chloride 0.9 % 145 mL IVPB, , Intravenous,  Once, 2 of 4 cycles Administration:  (08/10/2017),  (09/07/2017) atezolizumab (TECENTRIQ) 1,200 mg in sodium chloride 0.9 % 250 mL chemo infusion, 1,200 mg, Intravenous, Once, 2 of 4 cycles Administration: 1,200 mg (08/10/2017), 1,200 mg (09/07/2017)  for chemotherapy treatment.        INTERVAL HISTORY:  Phillip Sanchez 84 y.o.  male pleasant patient above history of metastatic small cell lung cancer is here for follow-up.  Patient had cycle #2 approximately 10 days ago.  Cycle #2 was interrupted because of recent admission to hospital for CHF.  Complains of swelling in the legs.  Shortness of breath.  Review of Systems  Constitutional: Positive for malaise/fatigue. Negative for chills, diaphoresis, fever and weight loss.  HENT: Negative for nosebleeds and sore throat.   Eyes: Negative for double vision.  Respiratory: Positive for cough and shortness of breath. Negative for hemoptysis, sputum production and wheezing.   Cardiovascular: Positive for leg swelling. Negative for chest pain, palpitations and orthopnea.  Gastrointestinal: Negative for abdominal pain, blood in stool, constipation, diarrhea, heartburn, melena, nausea and vomiting.  Genitourinary: Negative for dysuria, frequency and urgency.  Musculoskeletal: Negative for back pain and joint pain.  Skin: Negative.  Negative for itching and rash.  Neurological: Negative for dizziness, tingling, focal weakness, weakness and headaches.  Endo/Heme/Allergies: Does not bruise/bleed easily.  Psychiatric/Behavioral: Negative for depression. The patient is not nervous/anxious and does not have insomnia.       PAST MEDICAL HISTORY :  Past Medical History:  Diagnosis Date  . Anemia   . CHF (congestive heart failure) ([place])   . Colon cancer [facility])    He states they removed cancerous polyps.  . Hypercholesteremia   . Hypertension   . Lung cancer ([place])   .  Myocardial infarction ([place])   . Osteoarthritis   . Prostate cancer [facility])    Prostatectomy.  . Small cell lung cancer ([place]) 08/04/2017    PAST SURGICAL HISTORY :   Past Surgical History:  Procedure Laterality Date  . COLON SURGERY    . CORONARY ANGIOPLASTY WITH STENT PLACEMENT    . herniorrhaphy    . PORTA CATH INSERTION N/A 08/05/2017   Procedure: PORTA CATH INSERTION;  Surgeon: Katha Cabal, MD;  Location: [facility];  Service: Cardiovascular;  Laterality: N/A;    FAMILY HISTORY :   Family History  Problem Relation Age of Onset  . Cancer Brother 78       unknown orgin  . Cancer Sister 32       unknown type    SOCIAL HISTORY:   Social History   Tobacco Use  . Smoking status: Former Smoker    Packs/day: 1.00    Years: 36.00    Pack years: 36.00    Types: Cigarettes    Last attempt to quit: 1987    Years since quitting: 32.6  . Smokeless tobacco: Never Used  Substance Use Topics  . Alcohol use: No    Alcohol/week: 0.0 standard drinks  . Drug use: No    ALLERGIES:  has No Known Allergies.  MEDICATIONS:  Current Outpatient Medications  Medication Sig Dispense Refill  . calcium carbonate (OS-CAL - DOSED IN MG OF ELEMENTAL CALCIUM) 1250 (500 Ca) MG tablet Take 1 tablet by mouth daily.    . carvedilol (COREG) 25 MG tablet Take 25 mg by mouth 2 (two) times daily.     . cetirizine (ZYRTEC) 10 MG tablet Take 1 tablet by mouth daily.    . clopidogrel (PLAVIX) 75 MG tablet Take 75 mg by mouth daily.     Marland Kitchen docusate sodium (COLACE) 100 MG capsule Take 1 capsule (100 mg total) by mouth daily as needed. 30 capsule 2  . esomeprazole (NEXIUM) 20 MG capsule Take 1 capsule (20 mg total) by mouth daily. 90 capsule 1  . fexofenadine-pseudoephedrine (ALLEGRA-D 24) 180-240 MG per 24 hr tablet Take 1 tablet by mouth daily as needed (for allergies).     . fluticasone (FLONASE) 50 MCG/ACT nasal spray Place 1 spray into the nose daily as needed for allergies or rhinitis.      . Fluticasone-Salmeterol (ADVAIR DISKUS) 500-50 MCG/DOSE AEPB Inhale 1 puff into the lungs 2 (two) times daily. 1 each 3  . furosemide (LASIX) 20 MG tablet Take 1 tablet (20 mg total) by mouth 2 (two) times daily. 60 tablet 3  . lidocaine-prilocaine (EMLA) cream Apply to affected area once 30 g 3  . Multiple Vitamin (MULTI-VITAMINS) TABS Take 1 tablet by mouth daily.     . ondansetron (ZOFRAN) 8 MG tablet Take 1 tablet (8 mg total) by mouth 2 (two) times daily as needed for refractory nausea / vomiting. Start on day 3 after carboplatin chemo. 30 tablet 1  . Oxycodone HCl 10 MG TABS Take 1 tablet (10 mg total) by mouth every 6 (six) hours as needed. 30 tablet 0  . polyethylene glycol (MIRALAX / GLYCOLAX) packet Take 17 g by mouth daily. 30 each 0  . potassium chloride 20 MEQ  TBCR Take 20 mEq by mouth daily. 60 tablet 3  . prochlorperazine (COMPAZINE) 10 MG tablet Take 1 tablet (10 mg total) by mouth every 6 (six) hours as needed for nausea or vomiting. 40 tablet 1  . ranitidine (ZANTAC) 300 MG tablet Take 300 mg by mouth at bedtime.    Marland Kitchen rOPINIRole (REQUIP) 1 MG tablet Take 1 mg by mouth at bedtime.     . senna (SENOKOT) 8.6 MG TABS tablet Take 1 tablet (8.6 mg total) by mouth daily. 120 each 1  . temazepam (RESTORIL) 15 MG capsule Take 15-30 mg by mouth at bedtime as needed for sleep.     Marland Kitchen amLODipine-benazepril (LOTREL) 10-40 MG capsule Take 1 capsule by mouth daily.    Marland Kitchen aspirin EC 81 MG tablet Take 81 mg by mouth daily.     Marland Kitchen atorvastatin (LIPITOR) 20 MG tablet Take 20 mg by mouth at bedtime.      No current facility-administered medications for this visit.    Facility-Administered Medications Ordered in Other Visits  Medication Dose Route Frequency Provider Last Rate Last Dose  . etoposide (VEPESID) 200 mg in sodium chloride 0.9 % 500 mL chemo infusion  200 mg Intravenous Once Cammie Sickle, MD      . pegfilgrastim-cbqv [facility]) injection 6 mg  6 mg Subcutaneous Once Cammie Sickle, MD        PHYSICAL EXAMINATION: ECOG PERFORMANCE STATUS: 2 - Symptomatic, <50% confined to bed  BP 129/84 (BP Location: Right Arm, Patient Position: Sitting)   Pulse 75   Temp (!) 97.2 F (36.2 C) (Tympanic)   Resp 20   Ht 5\' 10"  (1.778 m)   Wt 178 lb 14.4 oz (81.1 kg)   BMI 25.67 kg/m   Filed Weights   09/16/17 0924  Weight: 178 lb 14.4 oz (81.1 kg)    GENERAL: Well-nourished well-developed; Alert, no distress and comfortable.  *Accompanied by son.  He is in a wheelchair. EYES: no pallor or icterus OROPHARYNX: no thrush or ulceration; NECK: supple; no lymph nodes felt. LYMPH:  no palpable lymphadenopathy in the axillary or inguinal regions LUNGS: Decreased breath sounds auscultation bilaterally. No wheeze or crackles HEART/CVS: regular rate & rhythm and no murmurs; positive for 2+ bilateral lower extremity edema ABDOMEN:abdomen soft, non-tender and normal bowel sounds. No hepatomegaly or splenomegaly.  Musculoskeletal:no cyanosis of digits and no clubbing  PSYCH: alert & oriented x 3 with fluent speech NEURO: no focal motor/sensory deficits SKIN:  no rashes or significant lesions    LABORATORY DATA:  I have reviewed the data as listed    Component Value Date/Time   NA 140 09/28/2017 0849   NA 143 01/24/2014 1057   K 3.6 09/28/2017 0849   K 3.9 01/24/2014 1057   CL 105 09/28/2017 0849   CL 106 01/24/2014 1057   CO2 25 09/28/2017 0849   CO2 27 01/24/2014 1057   GLUCOSE 168 (H) 09/28/2017 0849   GLUCOSE 105 (H) 01/24/2014 1057   BUN 15 09/28/2017 0849   BUN 24 (H) 01/24/2014 1057   CREATININE 0.95 09/28/2017 0849   CREATININE 1.19 01/24/2014 1057   CALCIUM 8.4 (L) 09/28/2017 0849   CALCIUM 8.9 01/24/2014 1057   PROT 6.0 (L) 09/28/2017 0849   PROT 6.8 01/24/2014 1057   ALBUMIN 3.2 (L) 09/28/2017 0849   ALBUMIN 3.5 01/24/2014 1057   AST 27 09/28/2017 0849   AST 19 01/24/2014 1057   ALT 16 09/28/2017 0849   ALT 25 01/24/2014 1057  ALKPHOS 67  09/28/2017 0849   ALKPHOS 73 01/24/2014 1057   BILITOT 0.5 09/28/2017 0849   BILITOT 0.6 01/24/2014 1057   GFRNONAA >60 09/28/2017 0849   GFRNONAA >60 01/24/2014 1057   GFRNONAA 50 (L) 10/21/2013 0447   GFRAA >60 09/28/2017 0849   GFRAA >60 01/24/2014 1057   GFRAA 58 (L) 10/21/2013 0447    No results found for: SPEP, UPEP  Lab Results  Component Value Date   WBC 2.8 (L) 09/28/2017   NEUTROABS 1.0 (L) 09/28/2017   HGB 10.8 (L) 09/28/2017   HCT 31.9 (L) 09/28/2017   MCV 102.3 (H) 09/28/2017   PLT 454 (H) 09/28/2017      Chemistry      Component Value Date/Time   NA 140 09/28/2017 0849   NA 143 01/24/2014 1057   K 3.6 09/28/2017 0849   K 3.9 01/24/2014 1057   CL 105 09/28/2017 0849   CL 106 01/24/2014 1057   CO2 25 09/28/2017 0849   CO2 27 01/24/2014 1057   BUN 15 09/28/2017 0849   BUN 24 (H) 01/24/2014 1057   CREATININE 0.95 09/28/2017 0849   CREATININE 1.19 01/24/2014 1057      Component Value Date/Time   CALCIUM 8.4 (L) 09/28/2017 0849   CALCIUM 8.9 01/24/2014 1057   ALKPHOS 67 09/28/2017 0849   ALKPHOS 73 01/24/2014 1057   AST 27 09/28/2017 0849   AST 19 01/24/2014 1057   ALT 16 09/28/2017 0849   ALT 25 01/24/2014 1057   BILITOT 0.5 09/28/2017 0849   BILITOT 0.6 01/24/2014 1057       RADIOGRAPHIC STUDIES: I have personally reviewed the radiological images as listed and agreed with the findings in the report. No results found.   ASSESSMENT & PLAN:  [name] #Extensive stage small cell lung cancer-currently status post carboplatin and etoposide plus Tecentriq second #2 approximately 10 days- ago.   #  Plan start chemo # 3; on 8/14; will get CT prior Chest.   # Recent acute CHF s/p admission EF- 40-45%. Recommend lasix 20 BID; added K 20 .   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   # follow up on 8/14;labs; chmeo; undeyca on Monday. CT 1-2 days prior. [place].    Orders Placed This  Encounter  Procedures  . CT CHEST W CONTRAST    Standing Status:   Future    Number of Occurrences:   1    Standing Expiration Date:   09/17/2018    Order Specific Question:   If indicated for the ordered procedure, I authorize the administration of contrast media per Radiology protocol    Answer:   Yes    Order Specific Question:   Preferred imaging location?    Answer:   [facility]    Order Specific Question:   Radiology Contrast Protocol - do NOT remove file path    Answer:   [email or web address]    Order Specific Question:   ** REASON FOR EXAM (FREE TEXT)    Answer:   lung cancer on chemo  . Comprehensive metabolic panel    Standing Status:   Future    Number of Occurrences:   1    Standing Expiration Date:   09/17/2018  . CBC with Differential    Standing Status:   Future    Number of Occurrences:   1    Standing Expiration Date:   09/17/2018  . Lactate dehydrogenase  Standing Status:   Future    Number of Occurrences:   1    Standing Expiration Date:   09/17/2018  . Brain natriuretic peptide    Standing Status:   Future    Number of Occurrences:   1    Standing Expiration Date:   09/17/2018   All questions were answered. The patient knows to call the clinic with any problems, questions or concerns.      Cammie Sickle, MD 09/30/2017 6:33 AM

## 2017-09-16 NOTE — Assessment & Plan Note (Addendum)
#  Extensive stage small cell lung cancer-currently status post carboplatin and etoposide plus Tecentriq second #2 approximately 10 days- ago.  Stable.  #  Plan start chemo # 3; on 8/14; will get CT prior Chest.   # Recent acute CHF s/p admission EF- 40-45%.  Worsening.  Recommend lasix 20 BID; added K 20 .   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 CT 1-2 days prior. [place].

## 2017-09-22 ENCOUNTER — Encounter: Payer: Self-pay | Admitting: Family

## 2017-09-22 ENCOUNTER — Ambulatory Visit
Admission: RE | Admit: 2017-09-22 | Discharge: 2017-09-22 | Disposition: A | Discharge status: Home / Self Care | Payer: Medicare Other | Source: Ambulatory Visit | Attending: Internal Medicine | Admitting: Internal Medicine

## 2017-09-22 ENCOUNTER — Ambulatory Visit: Payer: Medicare Other | Attending: Family | Admitting: Family

## 2017-09-22 VITALS — BP 124/59 | HR 73 | Resp 18 | Ht 70.0 in | Wt 170.0 lb

## 2017-09-22 DIAGNOSIS — I700 Atherosclerosis of aorta: Secondary | ICD-10-CM | POA: Diagnosis not present

## 2017-09-22 DIAGNOSIS — Z87891 Personal history of nicotine dependence: Secondary | ICD-10-CM | POA: Diagnosis not present

## 2017-09-22 DIAGNOSIS — C3490 Malignant neoplasm of unspecified part of unspecified bronchus or lung: Secondary | ICD-10-CM | POA: Diagnosis not present

## 2017-09-22 DIAGNOSIS — I5022 Chronic systolic (congestive) heart failure: Secondary | ICD-10-CM | POA: Insufficient documentation

## 2017-09-22 DIAGNOSIS — Z955 Presence of coronary angioplasty implant and graft: Secondary | ICD-10-CM | POA: Insufficient documentation

## 2017-09-22 DIAGNOSIS — J90 Pleural effusion, not elsewhere classified: Secondary | ICD-10-CM | POA: Diagnosis not present

## 2017-09-22 DIAGNOSIS — Z85038 Personal history of other malignant neoplasm of large intestine: Secondary | ICD-10-CM | POA: Diagnosis not present

## 2017-09-22 DIAGNOSIS — I509 Heart failure, unspecified: Secondary | ICD-10-CM | POA: Insufficient documentation

## 2017-09-22 DIAGNOSIS — C787 Secondary malignant neoplasm of liver and intrahepatic bile duct: Secondary | ICD-10-CM | POA: Insufficient documentation

## 2017-09-22 DIAGNOSIS — I110 Hypertensive heart disease with heart failure: Secondary | ICD-10-CM | POA: Insufficient documentation

## 2017-09-22 DIAGNOSIS — C3432 Malignant neoplasm of lower lobe, left bronchus or lung: Secondary | ICD-10-CM

## 2017-09-22 DIAGNOSIS — Z8546 Personal history of malignant neoplasm of prostate: Secondary | ICD-10-CM | POA: Insufficient documentation

## 2017-09-22 DIAGNOSIS — Z79899 Other long term (current) drug therapy: Secondary | ICD-10-CM | POA: Diagnosis not present

## 2017-09-22 DIAGNOSIS — I10 Essential (primary) hypertension: Secondary | ICD-10-CM | POA: Insufficient documentation

## 2017-09-22 DIAGNOSIS — Z7982 Long term (current) use of aspirin: Secondary | ICD-10-CM | POA: Diagnosis not present

## 2017-09-22 MED ORDER — IOHEXOL 300 MG/ML  SOLN
75.0000 mL | Freq: Once | INTRAMUSCULAR | Status: AC | PRN
  Administered 2017-09-22: 75 mL via INTRAVENOUS

## 2017-09-22 NOTE — Progress Notes (Signed)
Patient ID: Phillip Sanchez, male    DOB: Mar 29, 1927, 84 y.o.   MRN: [id number]  HPI  Mr Kichline is a 84 y/o male with a history of lung cancer, hyperlipidemia, HTN, MI, anemia, previous tobacco use and chronic heart failure.   Echo report from 09/10/17 reviewed and showed an EF of 40-45%.  Admitted 09/09/17 due to acute HF exacerbation. Initially needed bipap and IV lasix. Transitioned to nasal cannula and oral diuretics. Cardiology and pulmonology consults obtained. Discharged after 2 days. Admitted 07/26/17 due to pulmonary edema along with small pleural effusions. Initially given IV lasix with good response. Abdominal CT showed multiple liver lesions. Elevated troponin thought to be due to demand ischemia. Oncology and palliative care consults obtained. Discharged after 5 days.   He presents today for his initial visit with a chief complaint of moderate fatigue upon minimal exertion. He describes this as having been present for several months although he feels like it's improving. He has associated shortness of breath, light-headedness, chest pain and difficulty sleeping along with this. He denies any abdominal distention, palpitations, pedal edema or weight gain.   Past Medical History:  Diagnosis Date  . Anemia   . CHF (congestive heart failure) ([place])   . Colon cancer [facility])    He states they removed cancerous polyps.  . Hypercholesteremia   . Hypertension   . Lung cancer ([place])   . Myocardial infarction ([place])   . Osteoarthritis   . Prostate cancer [facility])    Prostatectomy.  . Small cell lung cancer ([place]) 08/04/2017   Past Surgical History:  Procedure Laterality Date  . COLON SURGERY    . CORONARY ANGIOPLASTY WITH STENT PLACEMENT    . herniorrhaphy    . PORTA CATH INSERTION N/A 08/05/2017   Procedure: PORTA CATH INSERTION;  Surgeon: Katha Cabal, MD;  Location: [place] [facility];  Service: Cardiovascular;  Laterality: N/A;   Family History  Problem Relation Age of Onset  .  Cancer Brother 15       unknown orgin  . Cancer Sister 87       unknown type   Social History   Tobacco Use  . Smoking status: Former Smoker    Packs/day: 1.00    Years: 36.00    Pack years: 36.00    Types: Cigarettes    Last attempt to quit: 1987    Years since quitting: 32.6  . Smokeless tobacco: Never Used  Substance Use Topics  . Alcohol use: No    Alcohol/week: 0.0 standard drinks   No Known Allergies Prior to Admission medications   Medication Sig Start Date End Date Taking? Authorizing Provider  amLODipine-benazepril (LOTREL) 10-40 MG capsule Take 1 capsule by mouth daily.   Yes [provider]  aspirin EC 81 MG tablet Take 81 mg by mouth daily.    Yes [provider]  atorvastatin (LIPITOR) 20 MG tablet Take 20 mg by mouth at bedtime.    Yes [provider]  calcium carbonate (OS-CAL - DOSED IN MG OF ELEMENTAL CALCIUM) 1250 (500 Ca) MG tablet Take 1 tablet by mouth daily.   Yes [provider]  carvedilol (COREG) 25 MG tablet Take 25 mg by mouth 2 (two) times daily.    Yes [provider]  cetirizine (ZYRTEC) 10 MG tablet Take 1 tablet by mouth daily.   Yes [provider]  clopidogrel (PLAVIX) 75 MG tablet Take 75 mg by mouth daily.    Yes [provider]  docusate sodium (COLACE) 100 MG capsule Take 1 capsule (100 mg total) by mouth daily as needed. 07/18/17 07/18/18 Yes Schuyler Amor, MD  esomeprazole (NEXIUM) 20 MG capsule Take 1 capsule (20 mg total) by mouth daily. 08/09/17  Yes Verlon Au, NP  fexofenadine-pseudoephedrine (ALLEGRA-D 24) 180-240 MG per 24 hr tablet Take 1 tablet by mouth daily as needed (for allergies).    Yes [provider]  fluticasone (FLONASE) 50 MCG/ACT nasal spray Place 1 spray into the nose daily as needed for allergies or rhinitis.    Yes [provider]  Fluticasone-Salmeterol (ADVAIR DISKUS) 500-50 MCG/DOSE AEPB Inhale 1 puff into the lungs 2 (two) times  daily. 09/07/17  Yes Cammie Sickle, MD  furosemide (LASIX) 20 MG tablet Take 1 tablet (20 mg total) by mouth 2 (two) times daily. 09/16/17  Yes Cammie Sickle, MD  lidocaine-prilocaine (EMLA) cream Apply to affected area once 08/04/17  Yes Earlie Server, MD  Multiple Vitamin (MULTI-VITAMINS) TABS Take 1 tablet by mouth daily.    Yes [provider]  ondansetron (ZOFRAN) 8 MG tablet Take 1 tablet (8 mg total) by mouth 2 (two) times daily as needed for refractory nausea / vomiting. Start on day 3 after carboplatin chemo. 08/04/17  Yes Earlie Server, MD  Oxycodone HCl 10 MG TABS Take 1 tablet (10 mg total) by mouth every 6 (six) hours as needed. 09/05/17  Yes Earlie Server, MD  polyethylene glycol [facility]) packet Take 17 g by mouth daily. 08/09/17  Yes Verlon Au, NP  potassium chloride 20 MEQ TBCR Take 20 mEq by mouth daily. 09/16/17  Yes Cammie Sickle, MD  prochlorperazine (COMPAZINE) 10 MG tablet Take 1 tablet (10 mg total) by mouth every 6 (six) hours as needed for nausea or vomiting. 08/10/17  Yes Cammie Sickle, MD  ranitidine (ZANTAC) 300 MG tablet Take 300 mg by mouth at bedtime.   Yes [provider]  rOPINIRole (REQUIP) 1 MG tablet Take 1 mg by mouth at bedtime.    Yes [provider]  senna (SENOKOT) 8.6 MG TABS tablet Take 1 tablet (8.6 mg total) by mouth daily. 08/09/17  Yes Verlon Au, NP  temazepam (RESTORIL) 15 MG capsule Take 15-30 mg by mouth at bedtime as needed for sleep.    Yes [provider]    Review of Systems  Constitutional: Positive for fatigue (improving). Negative for appetite change.  HENT: Negative for congestion, postnasal drip and sore throat.   Eyes: Negative.   Respiratory: Positive for shortness of breath (minimal). Negative for cough and chest tightness.   Cardiovascular: Positive for chest pain (occasionally on right side). Negative for palpitations and leg swelling.  Gastrointestinal: Negative for  abdominal distention and abdominal pain.  Endocrine: Negative.   Genitourinary: Negative.   Musculoskeletal: Negative for back pain and neck pain.  Skin: Negative.   Allergic/Immunologic: Negative.   Neurological: Positive for light-headedness (on occasion especially when changing positions too quickly). Negative for dizziness.  Hematological: Negative for adenopathy. Does not bruise/bleed easily.  Psychiatric/Behavioral: Positive for sleep disturbance (trouble falling asleep). Negative for dysphoric mood. The patient is not nervous/anxious.     Vitals:   09/22/17 1109  BP: (!) 124/59  Pulse: 73  Resp: 18  SpO2: 99%  Weight: 170 lb (77.1 kg)  Height: 5\' 10"  (1.778 m)   Wt Readings from Last 3 Encounters:  09/22/17 170 lb (77.1 kg)  09/16/17 178 lb 14.4 oz (81.1 kg)  09/09/17 178 lb 2.1 oz (80.8 kg)   Lab Results  Component Value Date   CREATININE 0.81 09/16/2017   CREATININE 1.08 09/10/2017   CREATININE 1.14 09/09/2017   Physical Exam  Constitutional: He is oriented to person, place, and time. He appears well-developed and well-nourished.  HENT:  Head: Normocephalic and atraumatic.  Neck: Normal range of motion. Neck supple. No JVD present.  Cardiovascular: Normal rate and regular rhythm.  Pulmonary/Chest: Effort normal. No respiratory distress. He has no wheezes. He has no rales.  Abdominal: Soft. He exhibits no distension. There is no tenderness.  Musculoskeletal: He exhibits no edema or tenderness.  Neurological: He is alert and oriented to person, place, and time.  Skin: Skin is warm and dry.  Psychiatric: He has a normal mood and affect. His behavior is normal. Thought content normal.  Nursing note and vitals reviewed.  Assessment & Plan:  1: Chronic heart failure with reduced ejection fraction- - NYHA class III - euvolemic today - hasn't been weighing daily and he was instructed to call for an overnight weight gain of >2 pounds or a weekly weight gain - not  adding salt and family has been reading food labels. Reviewed the importance of closely following a 2000mg  sodium diet and written dietary information was given to him about this - wearing compression socks - does elevate them during the day - on max carvedilol - consider changing lotrel to entresto at future visits if BP allows - saw cardiology ([facility]) 04/18/17 - BNP 09/09/17 was 458.0  2: HTN- - BP looks good today - saw PCP ([place]) 08/26/17 - BMP 09/16/17 reviewed and showed sodium 136, potassium 3.8 and GFR >60  3: Lung cancer- - saw oncologist Rogue Bussing) 09/16/17 - will be getting chemotherapy next week  Patient did not bring his medications nor a list. Each medication was verbally reviewed with the patient and he was encouraged to bring the bottles to every visit to confirm accuracy of list.  Return in 6 weeks or sooner for any questions/problems before then.

## 2017-09-22 NOTE — Patient Instructions (Addendum)
Begin weighing daily and call for an overnight weight gain of > 2 pounds or a weekly weight gain of >5 pounds. 

## 2017-09-28 ENCOUNTER — Inpatient Hospital Stay (HOSPITAL_BASED_OUTPATIENT_CLINIC_OR_DEPARTMENT_OTHER): Payer: Medicare Other | Admitting: Internal Medicine

## 2017-09-28 ENCOUNTER — Inpatient Hospital Stay: Payer: Medicare Other

## 2017-09-28 VITALS — BP 147/74 | HR 79 | Temp 98.1°F | Resp 16 | Wt 168.8 lb

## 2017-09-28 DIAGNOSIS — C3432 Malignant neoplasm of lower lobe, left bronchus or lung: Secondary | ICD-10-CM

## 2017-09-28 DIAGNOSIS — Z5111 Encounter for antineoplastic chemotherapy: Secondary | ICD-10-CM | POA: Diagnosis not present

## 2017-09-28 DIAGNOSIS — C787 Secondary malignant neoplasm of liver and intrahepatic bile duct: Secondary | ICD-10-CM

## 2017-09-28 DIAGNOSIS — Z87891 Personal history of nicotine dependence: Secondary | ICD-10-CM

## 2017-09-28 DIAGNOSIS — Z85038 Personal history of other malignant neoplasm of large intestine: Secondary | ICD-10-CM

## 2017-09-28 DIAGNOSIS — I509 Heart failure, unspecified: Secondary | ICD-10-CM | POA: Diagnosis not present

## 2017-09-28 DIAGNOSIS — Z8546 Personal history of malignant neoplasm of prostate: Secondary | ICD-10-CM

## 2017-09-28 LAB — COMPREHENSIVE METABOLIC PANEL
ALK PHOS: 67 U/L (ref 38–126)
ALT: 16 U/L (ref 0–44)
AST: 27 U/L (ref 15–41)
Albumin: 3.2 g/dL — ABNORMAL LOW (ref 3.5–5.0)
Anion gap: 10 (ref 5–15)
BILIRUBIN TOTAL: 0.5 mg/dL (ref 0.3–1.2)
BUN: 15 mg/dL (ref 8–23)
CALCIUM: 8.4 mg/dL — AB (ref 8.9–10.3)
CO2: 25 mmol/L (ref 22–32)
CREATININE: 0.95 mg/dL (ref 0.61–1.24)
Chloride: 105 mmol/L (ref 98–111)
GFR calc non Af Amer: >60 mL/min (ref >60)
Glucose, Bld: 168 mg/dL — ABNORMAL HIGH (ref 70–99)
Potassium: 3.6 mmol/L (ref 3.5–5.1)
Sodium: 140 mmol/L (ref 135–145)
TOTAL PROTEIN: 6 g/dL — AB (ref 6.5–8.1)

## 2017-09-28 LAB — CBC WITH DIFFERENTIAL/PLATELET
BASOS ABS: 0.4 10*3/uL — AB (ref 0–0.1)
Basophils Relative: 15 %
Eosinophils Absolute: 0.2 10*3/uL (ref 0–0.7)
Eosinophils Relative: 8 %
HEMATOCRIT: 31.9 % — AB (ref 40.0–52.0)
Hemoglobin: 10.8 g/dL — ABNORMAL LOW (ref 13.0–18.0)
LYMPHS ABS: 0.8 10*3/uL — AB (ref 1.0–3.6)
LYMPHS PCT: 27 %
MCH: 34.6 pg — AB (ref 26.0–34.0)
MCHC: 33.8 g/dL (ref 32.0–36.0)
MCV: 102.3 fL — AB (ref 80.0–100.0)
Monocytes Absolute: 0.5 10*3/uL (ref 0.2–1.0)
Monocytes Relative: 17 %
NEUTROS ABS: 1 10*3/uL — AB (ref 1.4–6.5)
Neutrophils Relative %: 33 %
Platelets: 454 10*3/uL — ABNORMAL HIGH (ref 150–440)
RBC: 3.12 MIL/uL — AB (ref 4.40–5.90)
RDW: 17.6 % — ABNORMAL HIGH (ref 11.5–14.5)
WBC: 2.8 10*3/uL — AB (ref 3.8–10.6)

## 2017-09-28 LAB — LACTATE DEHYDROGENASE: LDH: 174 U/L (ref 98–192)

## 2017-09-28 LAB — BRAIN NATRIURETIC PEPTIDE: B NATRIURETIC PEPTIDE 5: 241 pg/mL — AB (ref 0.0–100.0)

## 2017-09-28 MED ORDER — HEPARIN SOD (PORK) LOCK FLUSH 100 UNIT/ML IV SOLN
500.0000 [IU] | Freq: Once | INTRAVENOUS | Status: DC
  Filled 2017-09-28: qty 5

## 2017-09-28 MED ORDER — SODIUM CHLORIDE 0.9% FLUSH
10.0000 mL | Freq: Once | INTRAVENOUS | Status: AC
  Administered 2017-09-28: 10 mL via INTRAVENOUS
  Filled 2017-09-28: qty 10

## 2017-09-28 NOTE — Assessment & Plan Note (Addendum)
#  Extensive stage small cell lung cancer-currently status post carboplatin and etoposide plus Tecentriq second #2 approximately 3 weeks-Aug 8th CR-PR.  #Hold chemotherapy today because of absolute neutrophil count of 1.  Will postpone chemotherapy by 1 week.  Will reduce the dose of etoposide by 20%.  # Recent acute CHF s/p admission EF- 40-45%. Recommend lasix 20 BID; added K 20 .   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   # HOLD chemo; de-[place]; 8/19 or 8/20-cbc/Carbo-Atezo-Etop day-1; day-2 & d-3 etop; day-4 udenyca.   Follow up with MD/labs-cbc/cmp-10 days pots chemo.   # I reviewed the blood work- with the patient in detail; also reviewed the imaging independently [as summarized above]; and with the patient in detail.

## 2017-09-28 NOTE — Progress Notes (Signed)
[place] OFFICE PROGRESS NOTE  Patient Care Team: Idelle Crouch, MD as PCP - General (Internal Medicine) Telford Nab, RN as Registered Nurse Fath, Javier Docker, MD as Consulting Physician (Cardiology) Cammie Sickle, MD as Medical Oncologist (Medical Oncology)  Cancer Staging No matching staging information was found for the patient.   Oncology History   # 2015- [facility] [place] ca stage I s/p SBRT [Dr.Crystal]  # 3rd June 2019- Multiple liver lesions- s/p Bx- SMALL CELL of lung origin; STAGE IV;  # June 26th- carbo-etop-atezo  # Prostate cancer [ 66 years ago]; s/p surgery  # colon cancer [s/p resection of malignant polyps; Dr/Elliot; No colectomy]  # hx CAD [asprin/plavix]; hx PVD  -----------------------------------------------   DIAGNOSIS: Small cell lung cancer  STAGE:  IV     ;GOALS: Palliative  CURRENT/MOST RECENT THERAPY: carbo-etop-Atezo      [name]    Small cell lung cancer ([place])   08/04/2017 Initial Diagnosis    Small cell lung cancer ([place])    08/04/2017 -  Chemotherapy    The patient had palonosetron (ALOXI) injection 0.25 mg, 0.25 mg, Intravenous,  Once, 3 of 4 cycles Administration: 0.25 mg (08/10/2017), 0.25 mg (09/07/2017), 0.25 mg (10/04/2017) pegfilgrastim-cbqv (UDENYCA) injection 6 mg, 6 mg, Subcutaneous, Once, 3 of 4 cycles Administration: 6 mg (08/15/2017) CARBOplatin (PARAPLATIN) 340 mg in sodium chloride 0.9 % 250 mL chemo infusion, 340 mg (100 % of original dose 340.5 mg), Intravenous,  Once, 3 of 4 cycles Dose modification:   (original dose 340.5 mg, Cycle 1),   (original dose 431 mg, Cycle 2) Administration: 340 mg (08/10/2017), 430 mg (09/07/2017), 430 mg (10/04/2017) etoposide (VEPESID) 200 mg in sodium chloride 0.9 % 500 mL chemo infusion, 210 mg, Intravenous,  Once, 3 of 4 cycles Dose modification: 80 mg/m2 (original dose 100 mg/m2, Cycle 3, Reason: Provider Judgment) Administration: 200 mg  (08/10/2017), 200 mg (08/11/2017), 200 mg (08/12/2017), 200 mg (09/07/2017), 200 mg (09/08/2017), 160 mg (10/04/2017) fosaprepitant (EMEND) 150 mg, dexamethasone (DECADRON) 12 mg in sodium chloride 0.9 % 145 mL IVPB, , Intravenous,  Once, 3 of 4 cycles Administration:  (08/10/2017),  (09/07/2017),  (10/04/2017) atezolizumab (TECENTRIQ) 1,200 mg in sodium chloride 0.9 % 250 mL chemo infusion, 1,200 mg, Intravenous, Once, 3 of 4 cycles Administration: 1,200 mg (08/10/2017), 1,200 mg (09/07/2017), 1,200 mg (10/04/2017)  for chemotherapy treatment.        INTERVAL HISTORY:  Phillip Sanchez 84 y.o.  male pleasant patient above history of extensive stage small cell lung cancer status post 2 cycles of carboplatin etoposide with Gildardo Pounds is here to review the results of his restaging CAT scan.  Patient appetite is improved.  Weight loss is improving.  Not back to baseline.  Complains of mild to moderate fatigue.  Continues to have shortness of breath cough with exertion.  Mild leg swelling.  Review of Systems  Constitutional: Positive for malaise/fatigue and weight loss. Negative for chills, diaphoresis and fever.  HENT: Negative for nosebleeds and sore throat.   Eyes: Negative for double vision.  Respiratory: Positive for cough and shortness of breath. Negative for hemoptysis, sputum production and wheezing.   Cardiovascular: Positive for leg swelling. Negative for chest pain, palpitations and orthopnea.  Gastrointestinal: Negative for abdominal pain, blood in stool, constipation, diarrhea, heartburn, melena, nausea and vomiting.  Genitourinary: Negative for dysuria, frequency and urgency.  Musculoskeletal: Negative for back pain and joint pain.  Skin: Negative.  Negative for itching  and rash.  Neurological: Negative for dizziness, tingling, focal weakness, weakness and headaches.  Endo/Heme/Allergies: Does not bruise/bleed easily.  Psychiatric/Behavioral: Negative for depression. The patient is not  nervous/anxious and does not have insomnia.       PAST MEDICAL HISTORY :  Past Medical History:  Diagnosis Date  . Anemia   . CHF (congestive heart failure) ([place])   . Colon cancer [facility])    He states they removed cancerous polyps.  . Hypercholesteremia   . Hypertension   . Lung cancer ([place])   . Myocardial infarction ([place])   . Osteoarthritis   . Prostate cancer [facility])    Prostatectomy.  . Small cell lung cancer ([place]) 08/04/2017    PAST SURGICAL HISTORY :   Past Surgical History:  Procedure Laterality Date  . COLON SURGERY    . CORONARY ANGIOPLASTY WITH STENT PLACEMENT    . herniorrhaphy    . PORTA CATH INSERTION N/A 08/05/2017   Procedure: PORTA CATH INSERTION;  Surgeon: Katha Cabal, MD;  Location: [place] CV LAB;  Service: Cardiovascular;  Laterality: N/A;    FAMILY HISTORY :   Family History  Problem Relation Age of Onset  . Cancer Brother 51       unknown orgin  . Cancer Sister 62       unknown type    SOCIAL HISTORY:   Social History   Tobacco Use  . Smoking status: Former Smoker    Packs/day: 1.00    Years: 36.00    Pack years: 36.00    Types: Cigarettes    Last attempt to quit: 1987    Years since quitting: 32.6  . Smokeless tobacco: Never Used  Substance Use Topics  . Alcohol use: No    Alcohol/week: 0.0 standard drinks  . Drug use: No    ALLERGIES:  has No Known Allergies.  MEDICATIONS:  Current Outpatient Medications  Medication Sig Dispense Refill  . amLODipine-benazepril (LOTREL) 10-40 MG capsule Take 1 capsule by mouth daily.    Marland Kitchen aspirin EC 81 MG tablet Take 81 mg by mouth daily.     Marland Kitchen atorvastatin (LIPITOR) 20 MG tablet Take 20 mg by mouth at bedtime.     . calcium carbonate (OS-CAL - DOSED IN MG OF ELEMENTAL CALCIUM) 1250 (500 Ca) MG tablet Take 1 tablet by mouth daily.    . carvedilol (COREG) 25 MG tablet Take 25 mg by mouth 2 (two) times daily.     . cetirizine (ZYRTEC) 10 MG tablet Take 1 tablet by mouth daily.    .  clopidogrel (PLAVIX) 75 MG tablet Take 75 mg by mouth daily.     Marland Kitchen docusate sodium (COLACE) 100 MG capsule Take 1 capsule (100 mg total) by mouth daily as needed. 30 capsule 2  . esomeprazole (NEXIUM) 20 MG capsule Take 1 capsule (20 mg total) by mouth daily. 90 capsule 1  . fexofenadine-pseudoephedrine (ALLEGRA-D 24) 180-240 MG per 24 hr tablet Take 1 tablet by mouth daily as needed (for allergies).     . fluticasone (FLONASE) 50 MCG/ACT nasal spray Place 1 spray into the nose daily as needed for allergies or rhinitis.     . Fluticasone-Salmeterol (ADVAIR DISKUS) 500-50 MCG/DOSE AEPB Inhale 1 puff into the lungs 2 (two) times daily. 1 each 3  . furosemide (LASIX) 20 MG tablet Take 1 tablet (20 mg total) by mouth 2 (two) times daily. 60 tablet 3  . lidocaine-prilocaine (EMLA) cream Apply to affected area once 30 g 3  .  Multiple Vitamin (MULTI-VITAMINS) TABS Take 1 tablet by mouth daily.     . ondansetron (ZOFRAN) 8 MG tablet Take 1 tablet (8 mg total) by mouth 2 (two) times daily as needed for refractory nausea / vomiting. Start on day 3 after carboplatin chemo. 30 tablet 1  . Oxycodone HCl 10 MG TABS Take 1 tablet (10 mg total) by mouth every 6 (six) hours as needed. 30 tablet 0  . polyethylene glycol (MIRALAX / GLYCOLAX) packet Take 17 g by mouth daily. 30 each 0  . potassium chloride 20 MEQ TBCR Take 20 mEq by mouth daily. 60 tablet 3  . prochlorperazine (COMPAZINE) 10 MG tablet Take 1 tablet (10 mg total) by mouth every 6 (six) hours as needed for nausea or vomiting. 40 tablet 1  . ranitidine (ZANTAC) 300 MG tablet Take 300 mg by mouth at bedtime.    Marland Kitchen rOPINIRole (REQUIP) 1 MG tablet Take 1 mg by mouth at bedtime.     . senna (SENOKOT) 8.6 MG TABS tablet Take 1 tablet (8.6 mg total) by mouth daily. 120 each 1  . temazepam (RESTORIL) 15 MG capsule Take 15-30 mg by mouth at bedtime as needed for sleep.      No current facility-administered medications for this visit.    Facility-Administered  Medications Ordered in Other Visits  Medication Dose Route Frequency Provider Last Rate Last Dose  . etoposide (VEPESID) 200 mg in sodium chloride 0.9 % 500 mL chemo infusion  200 mg Intravenous Once Cammie Sickle, MD      . pegfilgrastim-cbqv [facility]) injection 6 mg  6 mg Subcutaneous Once Cammie Sickle, MD        PHYSICAL EXAMINATION: ECOG PERFORMANCE STATUS: 1 - Symptomatic but completely ambulatory  BP (!) 147/74 (BP Location: Left Arm, Patient Position: Sitting)   Pulse 79   Temp 98.1 F (36.7 C) (Tympanic)   Resp 16   Wt 168 lb 12.8 oz (76.6 kg)   BMI 24.22 kg/m   Filed Weights   09/28/17 0900  Weight: 168 lb 12.8 oz (76.6 kg)    Physical Exam  Constitutional: He is oriented to person, place, and time and well-developed, well-nourished, and in no distress.  Elderly male patient; accompanied by his son.  He is in a wheelchair.  HENT:  Head: Normocephalic and atraumatic.  Mouth/Throat: Oropharynx is clear and moist. No oropharyngeal exudate.  Eyes: Pupils are equal, round, and reactive to light.  Neck: Normal range of motion. Neck supple.  Cardiovascular: Normal rate and regular rhythm.  Pulmonary/Chest: No respiratory distress. He has no wheezes.  Decreased breath sounds bilaterally.  Abdominal: Soft. Bowel sounds are normal. He exhibits no distension and no mass. There is no tenderness. There is no rebound and no guarding.  Musculoskeletal: Normal range of motion. He exhibits no edema or tenderness.  Neurological: He is alert and oriented to person, place, and time.  Skin: Skin is warm.  Psychiatric: Affect normal.       LABORATORY DATA:  I have reviewed the data as listed    Component Value Date/Time   NA 139 10/04/2017 0856   NA 143 01/24/2014 1057   K 3.5 10/04/2017 0856   K 3.9 01/24/2014 1057   CL 106 10/04/2017 0856   CL 106 01/24/2014 1057   CO2 25 10/04/2017 0856   CO2 27 01/24/2014 1057   GLUCOSE 162 (H) 10/04/2017 0856    GLUCOSE 105 (H) 01/24/2014 1057   BUN 17 10/04/2017 0856   BUN  24 (H) 01/24/2014 1057   CREATININE 0.99 10/04/2017 0856   CREATININE 1.19 01/24/2014 1057   CALCIUM 8.6 (L) 10/04/2017 0856   CALCIUM 8.9 01/24/2014 1057   PROT 6.1 (L) 10/04/2017 0856   PROT 6.8 01/24/2014 1057   ALBUMIN 3.2 (L) 10/04/2017 0856   ALBUMIN 3.5 01/24/2014 1057   AST 28 10/04/2017 0856   AST 19 01/24/2014 1057   ALT 16 10/04/2017 0856   ALT 25 01/24/2014 1057   ALKPHOS 65 10/04/2017 0856   ALKPHOS 73 01/24/2014 1057   BILITOT 0.8 10/04/2017 0856   BILITOT 0.6 01/24/2014 1057   GFRNONAA >60 10/04/2017 0856   GFRNONAA >60 01/24/2014 1057   GFRNONAA 50 (L) 10/21/2013 0447   GFRAA >60 10/04/2017 0856   GFRAA >60 01/24/2014 1057   GFRAA 58 (L) 10/21/2013 0447    No results found for: SPEP, UPEP  Lab Results  Component Value Date   WBC 5.9 10/04/2017   NEUTROABS 3.7 10/04/2017   HGB 11.1 (L) 10/04/2017   HCT 32.2 (L) 10/04/2017   MCV 101.8 (H) 10/04/2017   PLT 275 10/04/2017      Chemistry      Component Value Date/Time   NA 139 10/04/2017 0856   NA 143 01/24/2014 1057   K 3.5 10/04/2017 0856   K 3.9 01/24/2014 1057   CL 106 10/04/2017 0856   CL 106 01/24/2014 1057   CO2 25 10/04/2017 0856   CO2 27 01/24/2014 1057   BUN 17 10/04/2017 0856   BUN 24 (H) 01/24/2014 1057   CREATININE 0.99 10/04/2017 0856   CREATININE 1.19 01/24/2014 1057      Component Value Date/Time   CALCIUM 8.6 (L) 10/04/2017 0856   CALCIUM 8.9 01/24/2014 1057   ALKPHOS 65 10/04/2017 0856   ALKPHOS 73 01/24/2014 1057   AST 28 10/04/2017 0856   AST 19 01/24/2014 1057   ALT 16 10/04/2017 0856   ALT 25 01/24/2014 1057   BILITOT 0.8 10/04/2017 0856   BILITOT 0.6 01/24/2014 1057       RADIOGRAPHIC STUDIES: I have personally reviewed the radiological images as listed and agreed with the findings in the report. No results found.   ASSESSMENT & PLAN:  [name] #Extensive stage  small cell lung cancer-currently status post carboplatin and etoposide plus Tecentriq second #2 approximately 3 weeks-Aug 8th CR-PR.  #Hold chemotherapy today because of absolute neutrophil count of 1.  Will postpone chemotherapy by 1 week.  Will reduce the dose of etoposide by 20%.  # Recent acute CHF s/p admission EF- 40-45%. Recommend lasix 20 BID; added K 20 .   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   # HOLD chemo; de-acess; 8/19 or 8/20-cbc/Carbo-Atezo-Etop day-1; day-2 & d-3 etop; day-4 udenyca.   Follow up with MD/labs-cbc/cmp-10 days pots chemo.   # I reviewed the blood work- with the patient in detail; also reviewed the imaging independently [as summarized above]; and with the patient in detail.     Orders Placed This Encounter  Procedures  . CBC with Differential    Standing Status:   Future    Standing Expiration Date:   09/28/2018  . CBC with Differential    Standing Status:   Future    Standing Expiration Date:   09/28/2018  . Comprehensive metabolic panel    Standing Status:   Future    Standing Expiration Date:   09/28/2018   All questions were answered. The  patient knows to call the clinic with any problems, questions or concerns.      Cammie Sickle, MD 10/04/2017 8:28 PM

## 2017-09-29 ENCOUNTER — Inpatient Hospital Stay: Payer: Medicare Other

## 2017-09-30 ENCOUNTER — Inpatient Hospital Stay: Payer: Medicare Other

## 2017-10-03 ENCOUNTER — Ambulatory Visit: Payer: Medicare Other

## 2017-10-04 ENCOUNTER — Inpatient Hospital Stay: Payer: Medicare Other

## 2017-10-04 ENCOUNTER — Other Ambulatory Visit: Payer: Self-pay | Admitting: Internal Medicine

## 2017-10-04 VITALS — BP 131/67 | HR 67 | Temp 98.0°F | Resp 18

## 2017-10-04 DIAGNOSIS — C3490 Malignant neoplasm of unspecified part of unspecified bronchus or lung: Secondary | ICD-10-CM

## 2017-10-04 DIAGNOSIS — Z5111 Encounter for antineoplastic chemotherapy: Secondary | ICD-10-CM | POA: Diagnosis not present

## 2017-10-04 LAB — CBC WITH DIFFERENTIAL/PLATELET
BASOS ABS: 0.5 10*3/uL — AB (ref 0–0.1)
Basophils Relative: 9 %
EOS ABS: 0.2 10*3/uL (ref 0–0.7)
Eosinophils Relative: 3 %
HCT: 32.2 % — ABNORMAL LOW (ref 40.0–52.0)
Hemoglobin: 11.1 g/dL — ABNORMAL LOW (ref 13.0–18.0)
LYMPHS PCT: 15 %
Lymphs Abs: 0.9 10*3/uL — ABNORMAL LOW (ref 1.0–3.6)
MCH: 35 pg — ABNORMAL HIGH (ref 26.0–34.0)
MCHC: 34.4 g/dL (ref 32.0–36.0)
MCV: 101.8 fL — ABNORMAL HIGH (ref 80.0–100.0)
Monocytes Absolute: 0.7 10*3/uL (ref 0.2–1.0)
Monocytes Relative: 11 %
Neutro Abs: 3.7 10*3/uL (ref 1.4–6.5)
Neutrophils Relative %: 62 %
Platelets: 275 10*3/uL (ref 150–440)
RBC: 3.16 MIL/uL — AB (ref 4.40–5.90)
RDW: 17.1 % — ABNORMAL HIGH (ref 11.5–14.5)
WBC: 5.9 10*3/uL (ref 3.8–10.6)

## 2017-10-04 LAB — COMPREHENSIVE METABOLIC PANEL
ALBUMIN: 3.2 g/dL — AB (ref 3.5–5.0)
ALK PHOS: 65 U/L (ref 38–126)
ALT: 16 U/L (ref 0–44)
ANION GAP: 8 (ref 5–15)
AST: 28 U/L (ref 15–41)
BUN: 17 mg/dL (ref 8–23)
CHLORIDE: 106 mmol/L (ref 98–111)
CO2: 25 mmol/L (ref 22–32)
Calcium: 8.6 mg/dL — ABNORMAL LOW (ref 8.9–10.3)
Creatinine, Ser: 0.99 mg/dL (ref 0.61–1.24)
GFR calc non Af Amer: >60 mL/min (ref >60)
Glucose, Bld: 162 mg/dL — ABNORMAL HIGH (ref 70–99)
Potassium: 3.5 mmol/L (ref 3.5–5.1)
SODIUM: 139 mmol/L (ref 135–145)
TOTAL PROTEIN: 6.1 g/dL — AB (ref 6.5–8.1)
Total Bilirubin: 0.8 mg/dL (ref 0.3–1.2)

## 2017-10-04 LAB — BLOOD GAS, VENOUS
Acid-base deficit: 3.4 mmol/L — ABNORMAL HIGH (ref 0.0–2.0)
Bicarbonate: 26.5 mmol/L (ref 20.0–28.0)
O2 SAT: 50.7 %
PATIENT TEMPERATURE: 37
PO2 VEN: 35 mmHg (ref 32.0–45.0)
pH, Ven: 7.18 — CL (ref 7.250–7.430)

## 2017-10-04 LAB — TSH: TSH: 0.973 u[IU]/mL (ref 0.350–4.500)

## 2017-10-04 MED ORDER — SODIUM CHLORIDE 0.9 % IV SOLN
431.0000 mg | Freq: Once | INTRAVENOUS | Status: AC
  Administered 2017-10-04: 430 mg via INTRAVENOUS
  Filled 2017-10-04: qty 43

## 2017-10-04 MED ORDER — SODIUM CHLORIDE 0.9 % IV SOLN
1200.0000 mg | Freq: Once | INTRAVENOUS | Status: AC
  Administered 2017-10-04: 1200 mg via INTRAVENOUS
  Filled 2017-10-04: qty 20

## 2017-10-04 MED ORDER — SODIUM CHLORIDE 0.9% FLUSH
10.0000 mL | Freq: Once | INTRAVENOUS | Status: AC
  Administered 2017-10-04: 10 mL via INTRAVENOUS
  Filled 2017-10-04: qty 10

## 2017-10-04 MED ORDER — SODIUM CHLORIDE 0.9 % IV SOLN
Freq: Once | INTRAVENOUS | Status: AC
  Administered 2017-10-04: 11:00:00 via INTRAVENOUS
  Filled 2017-10-04: qty 5

## 2017-10-04 MED ORDER — PALONOSETRON HCL INJECTION 0.25 MG/5ML
0.2500 mg | Freq: Once | INTRAVENOUS | Status: AC
  Administered 2017-10-04: 0.25 mg via INTRAVENOUS
  Filled 2017-10-04: qty 5

## 2017-10-04 MED ORDER — SODIUM CHLORIDE 0.9 % IV SOLN
80.0000 mg/m2 | Freq: Once | INTRAVENOUS | Status: AC
  Administered 2017-10-04: 160 mg via INTRAVENOUS
  Filled 2017-10-04: qty 8

## 2017-10-04 MED ORDER — HEPARIN SOD (PORK) LOCK FLUSH 100 UNIT/ML IV SOLN
500.0000 [IU] | Freq: Once | INTRAVENOUS | Status: AC
  Administered 2017-10-04: 500 [IU] via INTRAVENOUS
  Filled 2017-10-04: qty 5

## 2017-10-04 MED ORDER — SODIUM CHLORIDE 0.9 % IV SOLN
Freq: Once | INTRAVENOUS | Status: AC
  Administered 2017-10-04: 10:00:00 via INTRAVENOUS
  Filled 2017-10-04: qty 1000

## 2017-10-05 ENCOUNTER — Inpatient Hospital Stay: Payer: Medicare Other

## 2017-10-05 VITALS — BP 123/63 | HR 74 | Temp 97.4°F | Resp 18

## 2017-10-05 DIAGNOSIS — C3490 Malignant neoplasm of unspecified part of unspecified bronchus or lung: Secondary | ICD-10-CM

## 2017-10-05 DIAGNOSIS — Z5111 Encounter for antineoplastic chemotherapy: Secondary | ICD-10-CM | POA: Diagnosis not present

## 2017-10-05 MED ORDER — DEXAMETHASONE SODIUM PHOSPHATE 10 MG/ML IJ SOLN
10.0000 mg | Freq: Once | INTRAMUSCULAR | Status: AC
  Administered 2017-10-05: 10 mg via INTRAVENOUS

## 2017-10-05 MED ORDER — SODIUM CHLORIDE 0.9% FLUSH
10.0000 mL | INTRAVENOUS | Status: DC | PRN
  Administered 2017-10-05: 10 mL
  Filled 2017-10-05: qty 10

## 2017-10-05 MED ORDER — SODIUM CHLORIDE 0.9 % IV SOLN: 10.0000 mg | Freq: Once | INTRAVENOUS | Status: DC

## 2017-10-05 MED ORDER — SODIUM CHLORIDE 0.9 % IV SOLN
Freq: Once | INTRAVENOUS | Status: AC
  Administered 2017-10-05: 14:00:00 via INTRAVENOUS
  Filled 2017-10-05: qty 250

## 2017-10-05 MED ORDER — HEPARIN SOD (PORK) LOCK FLUSH 100 UNIT/ML IV SOLN
500.0000 [IU] | Freq: Once | INTRAVENOUS | Status: AC | PRN
  Administered 2017-10-05: 500 [IU]
  Filled 2017-10-05: qty 5

## 2017-10-05 MED ORDER — SODIUM CHLORIDE 0.9 % IV SOLN
80.0000 mg/m2 | Freq: Once | INTRAVENOUS | Status: AC
  Administered 2017-10-05: 160 mg via INTRAVENOUS
  Filled 2017-10-05: qty 8

## 2017-10-06 ENCOUNTER — Inpatient Hospital Stay: Payer: Medicare Other

## 2017-10-06 VITALS — BP 122/58 | HR 66 | Temp 98.6°F | Resp 18

## 2017-10-06 DIAGNOSIS — Z5111 Encounter for antineoplastic chemotherapy: Secondary | ICD-10-CM | POA: Diagnosis not present

## 2017-10-06 DIAGNOSIS — C3490 Malignant neoplasm of unspecified part of unspecified bronchus or lung: Secondary | ICD-10-CM

## 2017-10-06 MED ORDER — HEPARIN SOD (PORK) LOCK FLUSH 100 UNIT/ML IV SOLN
500.0000 [IU] | Freq: Once | INTRAVENOUS | Status: AC | PRN
  Administered 2017-10-06: 500 [IU]
  Filled 2017-10-06: qty 5

## 2017-10-06 MED ORDER — DEXAMETHASONE SODIUM PHOSPHATE 10 MG/ML IJ SOLN
10.0000 mg | Freq: Once | INTRAMUSCULAR | Status: AC
  Administered 2017-10-06: 10 mg via INTRAVENOUS
  Filled 2017-10-06: qty 1

## 2017-10-06 MED ORDER — SODIUM CHLORIDE 0.9 % IV SOLN
80.0000 mg/m2 | Freq: Once | INTRAVENOUS | Status: AC
  Administered 2017-10-06: 160 mg via INTRAVENOUS
  Filled 2017-10-06: qty 8

## 2017-10-06 MED ORDER — SODIUM CHLORIDE 0.9 % IV SOLN
Freq: Once | INTRAVENOUS | Status: AC
  Administered 2017-10-06: 14:00:00 via INTRAVENOUS
  Filled 2017-10-06: qty 250

## 2017-10-06 MED ORDER — SODIUM CHLORIDE 0.9% FLUSH
10.0000 mL | INTRAVENOUS | Status: DC | PRN
  Administered 2017-10-06: 10 mL
  Filled 2017-10-06: qty 10

## 2017-10-06 MED ORDER — SODIUM CHLORIDE 0.9 % IV SOLN: 10.0000 mg | Freq: Once | INTRAVENOUS | Status: DC

## 2017-10-07 ENCOUNTER — Inpatient Hospital Stay: Payer: Medicare Other

## 2017-10-07 DIAGNOSIS — C3490 Malignant neoplasm of unspecified part of unspecified bronchus or lung: Secondary | ICD-10-CM

## 2017-10-07 DIAGNOSIS — Z5111 Encounter for antineoplastic chemotherapy: Secondary | ICD-10-CM | POA: Diagnosis not present

## 2017-10-07 MED ORDER — PEGFILGRASTIM-CBQV 6 MG/0.6ML ~~LOC~~ SOSY
6.0000 mg | PREFILLED_SYRINGE | Freq: Once | SUBCUTANEOUS | Status: AC
  Administered 2017-10-07: 6 mg via SUBCUTANEOUS

## 2017-10-11 ENCOUNTER — Telehealth: Payer: Self-pay | Admitting: *Deleted

## 2017-10-11 NOTE — Telephone Encounter (Signed)
Christy from Palliative care called and reports that patient/son has declined Palliative referral at this time

## 2017-10-19 ENCOUNTER — Inpatient Hospital Stay: Payer: Medicare Other | Attending: Oncology

## 2017-10-19 ENCOUNTER — Other Ambulatory Visit: Payer: Self-pay

## 2017-10-19 ENCOUNTER — Inpatient Hospital Stay (HOSPITAL_BASED_OUTPATIENT_CLINIC_OR_DEPARTMENT_OTHER): Payer: Medicare Other | Admitting: Oncology

## 2017-10-19 ENCOUNTER — Encounter: Payer: Self-pay | Admitting: Oncology

## 2017-10-19 DIAGNOSIS — Z8546 Personal history of malignant neoplasm of prostate: Secondary | ICD-10-CM | POA: Diagnosis not present

## 2017-10-19 DIAGNOSIS — C7951 Secondary malignant neoplasm of bone: Secondary | ICD-10-CM | POA: Diagnosis not present

## 2017-10-19 DIAGNOSIS — I509 Heart failure, unspecified: Secondary | ICD-10-CM

## 2017-10-19 DIAGNOSIS — C3490 Malignant neoplasm of unspecified part of unspecified bronchus or lung: Secondary | ICD-10-CM

## 2017-10-19 DIAGNOSIS — Z87891 Personal history of nicotine dependence: Secondary | ICD-10-CM | POA: Diagnosis not present

## 2017-10-19 DIAGNOSIS — Z5111 Encounter for antineoplastic chemotherapy: Secondary | ICD-10-CM | POA: Diagnosis present

## 2017-10-19 DIAGNOSIS — Z5189 Encounter for other specified aftercare: Secondary | ICD-10-CM | POA: Diagnosis not present

## 2017-10-19 DIAGNOSIS — C3432 Malignant neoplasm of lower lobe, left bronchus or lung: Secondary | ICD-10-CM | POA: Insufficient documentation

## 2017-10-19 DIAGNOSIS — Z85038 Personal history of other malignant neoplasm of large intestine: Secondary | ICD-10-CM | POA: Diagnosis not present

## 2017-10-19 DIAGNOSIS — Z5112 Encounter for antineoplastic immunotherapy: Secondary | ICD-10-CM | POA: Diagnosis present

## 2017-10-19 DIAGNOSIS — C7931 Secondary malignant neoplasm of brain: Secondary | ICD-10-CM | POA: Diagnosis not present

## 2017-10-19 LAB — CBC WITH DIFFERENTIAL/PLATELET
BASOS ABS: 0.3 10*3/uL — AB (ref 0–0.1)
Basophils Relative: 3 %
EOS PCT: 3 %
Eosinophils Absolute: 0.3 10*3/uL (ref 0–0.7)
HEMATOCRIT: 31.5 % — AB (ref 40.0–52.0)
Hemoglobin: 10.7 g/dL — ABNORMAL LOW (ref 13.0–18.0)
LYMPHS PCT: 11 %
Lymphs Abs: 1 10*3/uL (ref 1.0–3.6)
MCH: 35.1 pg — ABNORMAL HIGH (ref 26.0–34.0)
MCHC: 34 g/dL (ref 32.0–36.0)
MCV: 103.1 fL — AB (ref 80.0–100.0)
Monocytes Absolute: 0.7 10*3/uL (ref 0.2–1.0)
Monocytes Relative: 8 %
NEUTROS ABS: 7.4 10*3/uL — AB (ref 1.4–6.5)
NEUTROS PCT: 77 %
Platelets: 132 10*3/uL — ABNORMAL LOW (ref 150–440)
RBC: 3.06 MIL/uL — AB (ref 4.40–5.90)
RDW: 16.4 % — ABNORMAL HIGH (ref 11.5–14.5)
WBC: 9.7 10*3/uL (ref 3.8–10.6)

## 2017-10-19 LAB — COMPREHENSIVE METABOLIC PANEL
ALBUMIN: 3.5 g/dL (ref 3.5–5.0)
ALK PHOS: 82 U/L (ref 38–126)
ALT: 14 U/L (ref 0–44)
AST: 24 U/L (ref 15–41)
Anion gap: 7 (ref 5–15)
BILIRUBIN TOTAL: 0.5 mg/dL (ref 0.3–1.2)
BUN: 11 mg/dL (ref 8–23)
CALCIUM: 8.8 mg/dL — AB (ref 8.9–10.3)
CO2: 27 mmol/L (ref 22–32)
Chloride: 105 mmol/L (ref 98–111)
Creatinine, Ser: 1.06 mg/dL (ref 0.61–1.24)
GFR calc Af Amer: >60 mL/min (ref >60)
GFR calc non Af Amer: >60 mL/min (ref >60)
GLUCOSE: 125 mg/dL — AB (ref 70–99)
POTASSIUM: 4.3 mmol/L (ref 3.5–5.1)
Sodium: 139 mmol/L (ref 135–145)
TOTAL PROTEIN: 6.5 g/dL (ref 6.5–8.1)

## 2017-10-19 LAB — TSH: TSH: 0.626 u[IU]/mL (ref 0.350–4.500)

## 2017-10-19 NOTE — Progress Notes (Signed)
[facility] OFFICE PROGRESS NOTE  Patient Care Team: Idelle Crouch, MD as PCP - General (Internal Medicine) Telford Nab, RN as Registered Nurse Fath, Javier Docker, MD as Consulting Physician (Cardiology) Cammie Sickle, MD as Medical Oncologist (Medical Oncology)  Cancer Staging No matching staging information was found for the patient.   Oncology History   # 2015- [facility] [place] ca stage I s/p SBRT [Dr.Crystal]  # 3rd June 2019- Multiple liver lesions- s/p Bx- SMALL CELL of lung origin; STAGE IV;  # June 26th- carbo-etop-atezo  # Prostate cancer [ 55 years ago]; s/p surgery  # colon cancer [s/p resection of malignant polyps; Dr/Elliot; No colectomy]  # hx CAD [asprin/plavix]; hx PVD  -----------------------------------------------   DIAGNOSIS: Small cell lung cancer  STAGE:  IV     ;GOALS: Palliative  CURRENT/MOST RECENT THERAPY: carbo-etop-Atezo      [name]    Small cell lung cancer ([place])   08/04/2017 Initial Diagnosis    Small cell lung cancer ([place])    08/04/2017 -  Chemotherapy    The patient had palonosetron (ALOXI) injection 0.25 mg, 0.25 mg, Intravenous,  Once, 3 of 4 cycles Administration: 0.25 mg (08/10/2017), 0.25 mg (09/07/2017), 0.25 mg (10/04/2017) pegfilgrastim-cbqv (UDENYCA) injection 6 mg, 6 mg, Subcutaneous, Once, 3 of 4 cycles Administration: 6 mg (08/15/2017), 6 mg (10/07/2017) CARBOplatin (PARAPLATIN) 340 mg in sodium chloride 0.9 % 250 mL chemo infusion, 340 mg (100 % of original dose 340.5 mg), Intravenous,  Once, 3 of 4 cycles Dose modification:   (original dose 340.5 mg, Cycle 1),   (original dose 431 mg, Cycle 2) Administration: 340 mg (08/10/2017), 430 mg (09/07/2017), 430 mg (10/04/2017) etoposide (VEPESID) 200 mg in sodium chloride 0.9 % 500 mL chemo infusion, 210 mg, Intravenous,  Once, 3 of 4 cycles Dose modification: 80 mg/m2 (original dose 100 mg/m2, Cycle 3, Reason: Provider  Judgment) Administration: 200 mg (08/10/2017), 200 mg (08/11/2017), 200 mg (08/12/2017), 200 mg (09/07/2017), 200 mg (09/08/2017), 160 mg (10/04/2017), 160 mg (10/05/2017), 160 mg (10/06/2017) fosaprepitant (EMEND) 150 mg, dexamethasone (DECADRON) 12 mg in sodium chloride 0.9 % 145 mL IVPB, , Intravenous,  Once, 3 of 4 cycles Administration:  (08/10/2017),  (09/07/2017),  (10/04/2017) atezolizumab (TECENTRIQ) 1,200 mg in sodium chloride 0.9 % 250 mL chemo infusion, 1,200 mg, Intravenous, Once, 3 of 4 cycles Administration: 1,200 mg (08/10/2017), 1,200 mg (09/07/2017), 1,200 mg (10/04/2017)  for chemotherapy treatment.        INTERVAL HISTORY:  MONTEZ CUDA 84 y.o.  male pleasant patient with above history of extensive stage small cell lung cancer status post 3 cycles of carboplatin-etoposide with Tecentriq, who returns to clinic for follow-up after most recent cycle of chemotherapy.   He was last seen in clinic on 09/28/2017 by Dr. Rogue Bussing.  At that time, ANC of 1 chemotherapy was postponed 1 week.  Etoposide was dose reduced by 20%.   Had recent admission (09/09/17-09/11/17) to the hospital for acute heart failure exacerbation.  Presented with hypoxia with oxygen saturations of 70% in the emergency room.  He was placed on a BiPAP and admitted for medical services.  He was started on Lasix 20 mg twice daily and potassium supplementation at discharge.  Today, patient is doing well.  He has maintained his appetite and weight loss is improving.  He denies any further swelling in bilateral lower extremities.  His breathing is better.  Continues to complain of moderate fatigue.  Has recently self  decreased Lasix dose from 40 mg daily to 20 mg daily.  States his swelling had improved and he was worried about his blood pressure being too low.  Review of Systems  Constitutional: Positive for malaise/fatigue. Negative for chills, fever and weight loss.  HENT: Negative for congestion and ear pain.   Eyes: Negative.   Negative for blurred vision and double vision.  Respiratory: Negative.  Negative for cough, sputum production and shortness of breath.   Cardiovascular: Negative.  Negative for chest pain, palpitations and leg swelling.  Gastrointestinal: Negative.  Negative for abdominal pain, constipation, diarrhea, nausea and vomiting.  Genitourinary: Negative for dysuria, frequency and urgency.  Musculoskeletal: Negative for back pain and falls.  Skin: Negative.  Negative for rash.  Neurological: Negative.  Negative for weakness and headaches.  Endo/Heme/Allergies: Negative.  Does not bruise/bleed easily.  Psychiatric/Behavioral: Negative.  Negative for depression. The patient is not nervous/anxious and does not have insomnia.       PAST MEDICAL HISTORY :  Past Medical History:  Diagnosis Date  . Anemia   . CHF (congestive heart failure) ([place])   . Colon cancer [facility])    He states they removed cancerous polyps.  . Hypercholesteremia   . Hypertension   . Lung cancer ([place])   . Myocardial infarction ([place])   . Osteoarthritis   . Prostate cancer [facility])    Prostatectomy.  . Small cell lung cancer ([place]) 08/04/2017    PAST SURGICAL HISTORY :   Past Surgical History:  Procedure Laterality Date  . COLON SURGERY    . CORONARY ANGIOPLASTY WITH STENT PLACEMENT    . herniorrhaphy    . PORTA CATH INSERTION N/A 08/05/2017   Procedure: PORTA CATH INSERTION;  Surgeon: Katha Cabal, MD;  Location: [place] [facility];  Service: Cardiovascular;  Laterality: N/A;    FAMILY HISTORY :   Family History  Problem Relation Age of Onset  . Cancer Brother 79       unknown orgin  . Cancer Sister 31       unknown type    SOCIAL HISTORY:   Social History   Tobacco Use  . Smoking status: Former Smoker    Packs/day: 1.00    Years: 36.00    Pack years: 36.00    Types: Cigarettes    Last attempt to quit: 1987    Years since quitting: 32.6  . Smokeless tobacco: Never Used  Substance Use Topics  .  Alcohol use: No    Alcohol/week: 0.0 standard drinks  . Drug use: No    ALLERGIES:  has No Known Allergies.  MEDICATIONS:  Current Outpatient Medications  Medication Sig Dispense Refill  . amLODipine-benazepril (LOTREL) 10-40 MG capsule Take 1 capsule by mouth daily.    Marland Kitchen aspirin EC 81 MG tablet Take 81 mg by mouth daily.     Marland Kitchen atorvastatin (LIPITOR) 20 MG tablet Take 20 mg by mouth at bedtime.     . calcium carbonate (OS-CAL - DOSED IN MG OF ELEMENTAL CALCIUM) 1250 (500 Ca) MG tablet Take 1 tablet by mouth daily.    . carvedilol (COREG) 25 MG tablet Take 25 mg by mouth 2 (two) times daily.     . cetirizine (ZYRTEC) 10 MG tablet Take 1 tablet by mouth daily.    . ciprofloxacin (CIPRO) 500 MG tablet Take 500 mg by mouth 2 (two) times daily.     . clopidogrel (PLAVIX) 75 MG tablet Take 75 mg by mouth daily.     Marland Kitchen  docusate sodium (COLACE) 100 MG capsule Take 1 capsule (100 mg total) by mouth daily as needed. 30 capsule 2  . esomeprazole (NEXIUM) 20 MG capsule Take 1 capsule (20 mg total) by mouth daily. 90 capsule 1  . fluticasone (FLONASE) 50 MCG/ACT nasal spray Place 1 spray into the nose daily as needed for allergies or rhinitis.     . Fluticasone-Salmeterol (ADVAIR DISKUS) 500-50 MCG/DOSE AEPB Inhale 1 puff into the lungs 2 (two) times daily. 1 each 3  . furosemide (LASIX) 20 MG tablet Take 1 tablet (20 mg total) by mouth 2 (two) times daily. (Patient taking differently: Take 20 mg by mouth daily. ) 60 tablet 3  . lidocaine-prilocaine (EMLA) cream Apply to affected area once 30 g 3  . Multiple Vitamin (MULTI-VITAMINS) TABS Take 1 tablet by mouth daily.     . ondansetron (ZOFRAN) 8 MG tablet Take 1 tablet (8 mg total) by mouth 2 (two) times daily as needed for refractory nausea / vomiting. Start on day 3 after carboplatin chemo. 30 tablet 1  . Oxycodone HCl 10 MG TABS Take 1 tablet (10 mg total) by mouth every 6 (six) hours as needed. 30 tablet 0  . polyethylene glycol (MIRALAX / GLYCOLAX)  packet Take 17 g by mouth daily. 30 each 0  . potassium chloride 20 MEQ TBCR Take 20 mEq by mouth daily. 60 tablet 3  . prochlorperazine (COMPAZINE) 10 MG tablet Take 1 tablet (10 mg total) by mouth every 6 (six) hours as needed for nausea or vomiting. 40 tablet 1  . ranitidine (ZANTAC) 300 MG tablet Take 300 mg by mouth at bedtime.    Marland Kitchen rOPINIRole (REQUIP) 1 MG tablet Take 1 mg by mouth at bedtime.     . senna (SENOKOT) 8.6 MG TABS tablet Take 1 tablet (8.6 mg total) by mouth daily. 120 each 1  . temazepam (RESTORIL) 15 MG capsule Take 15-30 mg by mouth at bedtime as needed for sleep.     . fexofenadine-pseudoephedrine (ALLEGRA-D 24) 180-240 MG per 24 hr tablet Take 1 tablet by mouth daily as needed (for allergies).      No current facility-administered medications for this visit.    Facility-Administered Medications Ordered in Other Visits  Medication Dose Route Frequency Provider Last Rate Last Dose  . etoposide (VEPESID) 200 mg in sodium chloride 0.9 % 500 mL chemo infusion  200 mg Intravenous Once Cammie Sickle, MD      . pegfilgrastim-cbqv [facility]) injection 6 mg  6 mg Subcutaneous Once Cammie Sickle, MD        PHYSICAL EXAMINATION: ECOG PERFORMANCE STATUS: 1 - Symptomatic but completely ambulatory  BP (!) 160/71   Pulse 80   Temp 98.1 F (36.7 C) (Oral)   Resp 18   There were no vitals filed for this visit.  Physical Exam  Constitutional: He is oriented to person, place, and time and well-developed, well-nourished, and in no distress. Vital signs are normal.  Elderly male  HENT:  Head: Normocephalic and atraumatic.  Eyes: Pupils are equal, round, and reactive to light.  Neck: Normal range of motion.  Cardiovascular: Normal rate, regular rhythm and normal heart sounds.  No murmur heard. Pulmonary/Chest: Effort normal and breath sounds normal. He has no wheezes.  Abdominal: Soft. Normal appearance and bowel sounds are normal. He exhibits no distension.  There is no tenderness.  Musculoskeletal: Normal range of motion. He exhibits no edema.  Neurological: He is alert and oriented to person, place, and time.  Gait normal.  Skin: Skin is warm and dry. No rash noted.  Psychiatric: Mood, memory, affect and judgment normal.    LABORATORY DATA:  I have reviewed the data as listed    Component Value Date/Time   NA 139 10/19/2017 0925   NA 143 01/24/2014 1057   K 4.3 10/19/2017 0925   K 3.9 01/24/2014 1057   CL 105 10/19/2017 0925   CL 106 01/24/2014 1057   CO2 27 10/19/2017 0925   CO2 27 01/24/2014 1057   GLUCOSE 125 (H) 10/19/2017 0925   GLUCOSE 105 (H) 01/24/2014 1057   BUN 11 10/19/2017 0925   BUN 24 (H) 01/24/2014 1057   CREATININE 1.06 10/19/2017 0925   CREATININE 1.19 01/24/2014 1057   CALCIUM 8.8 (L) 10/19/2017 0925   CALCIUM 8.9 01/24/2014 1057   PROT 6.5 10/19/2017 0925   PROT 6.8 01/24/2014 1057   ALBUMIN 3.5 10/19/2017 0925   ALBUMIN 3.5 01/24/2014 1057   AST 24 10/19/2017 0925   AST 19 01/24/2014 1057   ALT 14 10/19/2017 0925   ALT 25 01/24/2014 1057   ALKPHOS 82 10/19/2017 0925   ALKPHOS 73 01/24/2014 1057   BILITOT 0.5 10/19/2017 0925   BILITOT 0.6 01/24/2014 1057   GFRNONAA >60 10/19/2017 0925   GFRNONAA >60 01/24/2014 1057   GFRNONAA 50 (L) 10/21/2013 0447   GFRAA >60 10/19/2017 0925   GFRAA >60 01/24/2014 1057   GFRAA 58 (L) 10/21/2013 0447    No results found for: SPEP, UPEP  Lab Results  Component Value Date   WBC 9.7 10/19/2017   NEUTROABS 7.4 (H) 10/19/2017   HGB 10.7 (L) 10/19/2017   HCT 31.5 (L) 10/19/2017   MCV 103.1 (H) 10/19/2017   PLT 132 (L) 10/19/2017      Chemistry      Component Value Date/Time   NA 139 10/19/2017 0925   NA 143 01/24/2014 1057   K 4.3 10/19/2017 0925   K 3.9 01/24/2014 1057   CL 105 10/19/2017 0925   CL 106 01/24/2014 1057   CO2 27 10/19/2017 0925   CO2 27 01/24/2014 1057   BUN 11 10/19/2017 0925   BUN 24 (H) 01/24/2014 1057   CREATININE 1.06 10/19/2017  0925   CREATININE 1.19 01/24/2014 1057      Component Value Date/Time   CALCIUM 8.8 (L) 10/19/2017 0925   CALCIUM 8.9 01/24/2014 1057   ALKPHOS 82 10/19/2017 0925   ALKPHOS 73 01/24/2014 1057   AST 24 10/19/2017 0925   AST 19 01/24/2014 1057   ALT 14 10/19/2017 0925   ALT 25 01/24/2014 1057   BILITOT 0.5 10/19/2017 0925   BILITOT 0.6 01/24/2014 1057       RADIOGRAPHIC STUDIES: I have personally reviewed the radiological images as listed and agreed with the findings in the report. No results found.   ASSESSMENT & PLAN:  [name] #Extensive stage small cell lung cancer-currently status post carboplatin and etoposide plus Tecentriq second #2 approximately 3 weeks-Aug 8th CR-PR.  #Hold chemotherapy today because of absolute neutrophil count of 1.  Will postpone chemotherapy by 1 week.  Will reduce the dose of etoposide by 20%.  # Recent acute CHF s/p admission EF- 40-45%. Recommend lasix 20 BID; added K 20 .    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   # HOLD chemo; de-acess; 8/19 or 8/20-cbc/Carbo-Atezo-Etop day-1; day-2 & d-3 etop; day-4 udenyca.   Follow up with MD/labs-cbc/cmp-10 days pots  chemo.   # I reviewed the blood work- with the patient in detail; also reviewed the imaging independently [as summarized above]; and with the patient in detail.     No orders of the defined types were placed in this encounter.  All questions were answered. The patient knows to call the clinic with any problems, questions or concerns.      Jacquelin Hawking, NP 10/19/2017 10:21 AM

## 2017-10-19 NOTE — Assessment & Plan Note (Addendum)
Extensive stage small cell lung cancer-s/p cycle 3 carbo/etopiside/tecentriq given on 10/07/2017.  Cycle 2 was delayed d/t ANC of 1.  CHF: Had recent admission to the hospital for acute heart failure exacerbation.  He was started on IV Lasix and placed on BiPAP.  EF 40 to 45%.  He was started on p.o. Lasix 40 mg daily.  He has self adjusted his medications to 20 mg daily.  Swelling has resolved.  Blood pressure elevated today.  Hypertension: Patient encouraged to continue hypertensive medications as prescribed.  He is also encouraged to keep blood pressure log and report blood pressure readings.  If blood pressure continues to stay elevated, patient is to call clinic that we can adjust his medications.  Brain metastasis: 2 brain lesions were identified.  No significant edema noted.  Appears to be stable.  RTC in 1 week for labs/MD assessment and cycle 4.

## 2017-10-26 ENCOUNTER — Encounter: Payer: Self-pay | Admitting: Internal Medicine

## 2017-10-26 ENCOUNTER — Inpatient Hospital Stay: Payer: Medicare Other

## 2017-10-26 ENCOUNTER — Inpatient Hospital Stay (HOSPITAL_BASED_OUTPATIENT_CLINIC_OR_DEPARTMENT_OTHER): Payer: Medicare Other | Admitting: Internal Medicine

## 2017-10-26 ENCOUNTER — Other Ambulatory Visit: Payer: Self-pay

## 2017-10-26 VITALS — BP 162/72 | HR 79 | Temp 96.7°F | Resp 20 | Ht 70.0 in | Wt 170.0 lb

## 2017-10-26 DIAGNOSIS — I509 Heart failure, unspecified: Secondary | ICD-10-CM | POA: Diagnosis not present

## 2017-10-26 DIAGNOSIS — C3490 Malignant neoplasm of unspecified part of unspecified bronchus or lung: Secondary | ICD-10-CM

## 2017-10-26 DIAGNOSIS — C7931 Secondary malignant neoplasm of brain: Secondary | ICD-10-CM | POA: Diagnosis not present

## 2017-10-26 DIAGNOSIS — I10 Essential (primary) hypertension: Secondary | ICD-10-CM | POA: Diagnosis not present

## 2017-10-26 DIAGNOSIS — C3432 Malignant neoplasm of lower lobe, left bronchus or lung: Secondary | ICD-10-CM

## 2017-10-26 DIAGNOSIS — Z5111 Encounter for antineoplastic chemotherapy: Secondary | ICD-10-CM | POA: Diagnosis not present

## 2017-10-26 DIAGNOSIS — Z8546 Personal history of malignant neoplasm of prostate: Secondary | ICD-10-CM

## 2017-10-26 DIAGNOSIS — Z85038 Personal history of other malignant neoplasm of large intestine: Secondary | ICD-10-CM

## 2017-10-26 DIAGNOSIS — Z87891 Personal history of nicotine dependence: Secondary | ICD-10-CM

## 2017-10-26 LAB — COMPREHENSIVE METABOLIC PANEL
ALBUMIN: 3.5 g/dL (ref 3.5–5.0)
ALT: 18 U/L (ref 0–44)
AST: 29 U/L (ref 15–41)
Alkaline Phosphatase: 65 U/L (ref 38–126)
Anion gap: 7 (ref 5–15)
BUN: 14 mg/dL (ref 8–23)
CHLORIDE: 107 mmol/L (ref 98–111)
CO2: 27 mmol/L (ref 22–32)
Calcium: 8.9 mg/dL (ref 8.9–10.3)
Creatinine, Ser: 0.98 mg/dL (ref 0.61–1.24)
GFR calc Af Amer: >60 mL/min (ref >60)
GFR calc non Af Amer: >60 mL/min (ref >60)
GLUCOSE: 144 mg/dL — AB (ref 70–99)
Potassium: 3.7 mmol/L (ref 3.5–5.1)
Sodium: 141 mmol/L (ref 135–145)
Total Bilirubin: 0.6 mg/dL (ref 0.3–1.2)
Total Protein: 6.4 g/dL — ABNORMAL LOW (ref 6.5–8.1)

## 2017-10-26 LAB — CBC WITH DIFFERENTIAL/PLATELET
Basophils Absolute: 0.2 10*3/uL — ABNORMAL HIGH (ref 0–0.1)
Basophils Relative: 4 %
EOS PCT: 2 %
Eosinophils Absolute: 0.1 10*3/uL (ref 0–0.7)
HCT: 31.3 % — ABNORMAL LOW (ref 40.0–52.0)
Hemoglobin: 10.7 g/dL — ABNORMAL LOW (ref 13.0–18.0)
LYMPHS ABS: 0.9 10*3/uL — AB (ref 1.0–3.6)
LYMPHS PCT: 16 %
MCH: 35.1 pg — AB (ref 26.0–34.0)
MCHC: 34.1 g/dL (ref 32.0–36.0)
MCV: 103 fL — AB (ref 80.0–100.0)
MONO ABS: 0.6 10*3/uL (ref 0.2–1.0)
MONOS PCT: 11 %
Neutro Abs: 4 10*3/uL (ref 1.4–6.5)
Neutrophils Relative %: 67 %
Platelets: 387 10*3/uL (ref 150–440)
RBC: 3.04 MIL/uL — ABNORMAL LOW (ref 4.40–5.90)
RDW: 17 % — AB (ref 11.5–14.5)
WBC: 5.9 10*3/uL (ref 3.8–10.6)

## 2017-10-26 LAB — TSH: TSH: 0.587 u[IU]/mL (ref 0.350–4.500)

## 2017-10-26 MED ORDER — SODIUM CHLORIDE 0.9 % IV SOLN
430.0000 mg | Freq: Once | INTRAVENOUS | Status: AC
  Administered 2017-10-26: 430 mg via INTRAVENOUS
  Filled 2017-10-26: qty 43

## 2017-10-26 MED ORDER — SODIUM CHLORIDE 0.9 % IV SOLN
Freq: Once | INTRAVENOUS | Status: AC
  Administered 2017-10-26: 09:00:00 via INTRAVENOUS
  Filled 2017-10-26: qty 250

## 2017-10-26 MED ORDER — SODIUM CHLORIDE 0.9% FLUSH
10.0000 mL | INTRAVENOUS | Status: DC | PRN
  Administered 2017-10-26: 10 mL via INTRAVENOUS
  Filled 2017-10-26: qty 10

## 2017-10-26 MED ORDER — PALONOSETRON HCL INJECTION 0.25 MG/5ML
0.2500 mg | Freq: Once | INTRAVENOUS | Status: AC
  Administered 2017-10-26: 0.25 mg via INTRAVENOUS
  Filled 2017-10-26: qty 5

## 2017-10-26 MED ORDER — SODIUM CHLORIDE 0.9 % IV SOLN
Freq: Once | INTRAVENOUS | Status: AC
  Administered 2017-10-26: 09:00:00 via INTRAVENOUS
  Filled 2017-10-26: qty 5

## 2017-10-26 MED ORDER — HEPARIN SOD (PORK) LOCK FLUSH 100 UNIT/ML IV SOLN: 500.0000 [IU] | Freq: Once | INTRAVENOUS | Status: DC

## 2017-10-26 MED ORDER — SODIUM CHLORIDE 0.9 % IV SOLN
1200.0000 mg | Freq: Once | INTRAVENOUS | Status: AC
  Administered 2017-10-26: 1200 mg via INTRAVENOUS
  Filled 2017-10-26: qty 20

## 2017-10-26 MED ORDER — SODIUM CHLORIDE 0.9 % IV SOLN
80.0000 mg/m2 | Freq: Once | INTRAVENOUS | Status: AC
  Administered 2017-10-26: 160 mg via INTRAVENOUS
  Filled 2017-10-26: qty 8

## 2017-10-26 MED ORDER — HEPARIN SOD (PORK) LOCK FLUSH 100 UNIT/ML IV SOLN
500.0000 [IU] | Freq: Once | INTRAVENOUS | Status: AC | PRN
  Administered 2017-10-26: 500 [IU]

## 2017-10-26 NOTE — Assessment & Plan Note (Addendum)
#  Extensive stage small cell lung cancer-currently on carboplatin and etoposide plus Tecentriq. Aug 8th CT CR-PR.  Currently status post 3 cycles.  Clinically stable.  # proceed with cycle #4 today. Labs today reviewed;  acceptable for treatment today.   # Recent acute CHF s/p admission EF- 40-45%. continue lasix 20 BID/ K 20 . STABLE.   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 MRI in 4 weeks.   # treatment today; follow up in 4 weeks/labs; CT scan prior/MRI prior; Tecentriq alone.

## 2017-10-26 NOTE — Progress Notes (Signed)
[place] OFFICE PROGRESS NOTE  Patient Care Team: Idelle Crouch, MD as PCP - General (Internal Medicine) Telford Nab, RN as Registered Nurse Fath, Javier Docker, MD as Consulting Physician (Cardiology) Cammie Sickle, MD as Medical Oncologist (Medical Oncology)  Cancer Staging No matching staging information was found for the patient.   Oncology History   # 2015- [facility] [place] ca stage I s/p SBRT [Dr.Crystal]  # 3rd June 2019- Multiple liver lesions- s/p Bx- SMALL CELL of lung origin; STAGE IV;  # June 26th- carbo-etop-atezo  # Prostate cancer [ 53 years ago]; s/p surgery  # colon cancer [s/p resection of malignant polyps; Dr/Elliot; No colectomy]  # hx CAD [asprin/plavix]; hx PVD  -----------------------------------------------   DIAGNOSIS: Small cell lung cancer  STAGE:  IV     ;GOALS: Palliative  CURRENT/MOST RECENT THERAPY: carbo-etop-Atezo      [name]    Small cell lung cancer ([place])   08/04/2017 Initial Diagnosis    Small cell lung cancer ([place])    08/04/2017 -  Chemotherapy    The patient had palonosetron (ALOXI) injection 0.25 mg, 0.25 mg, Intravenous,  Once, 4 of 4 cycles Administration: 0.25 mg (08/10/2017), 0.25 mg (09/07/2017), 0.25 mg (10/04/2017) pegfilgrastim-cbqv (UDENYCA) injection 6 mg, 6 mg, Subcutaneous, Once, 4 of 4 cycles Administration: 6 mg (08/15/2017), 6 mg (10/07/2017) CARBOplatin (PARAPLATIN) 340 mg in sodium chloride 0.9 % 250 mL chemo infusion, 340 mg (100 % of original dose 340.5 mg), Intravenous,  Once, 4 of 4 cycles Dose modification:   (original dose 340.5 mg, Cycle 1),   (original dose 431 mg, Cycle 2) Administration: 340 mg (08/10/2017), 430 mg (09/07/2017), 430 mg (10/04/2017) etoposide (VEPESID) 200 mg in sodium chloride 0.9 % 500 mL chemo infusion, 210 mg, Intravenous,  Once, 4 of 4 cycles Dose modification: 80 mg/m2 (original dose 100 mg/m2, Cycle 3, Reason: Provider  Judgment) Administration: 200 mg (08/10/2017), 200 mg (08/11/2017), 200 mg (08/12/2017), 200 mg (09/07/2017), 200 mg (09/08/2017), 160 mg (10/04/2017), 160 mg (10/05/2017), 160 mg (10/06/2017) fosaprepitant (EMEND) 150 mg, dexamethasone (DECADRON) 12 mg in sodium chloride 0.9 % 145 mL IVPB, , Intravenous,  Once, 4 of 4 cycles Administration:  (08/10/2017),  (09/07/2017),  (10/04/2017) atezolizumab (TECENTRIQ) 1,200 mg in sodium chloride 0.9 % 250 mL chemo infusion, 1,200 mg, Intravenous, Once, 4 of 4 cycles Administration: 1,200 mg (08/10/2017), 1,200 mg (09/07/2017), 1,200 mg (10/04/2017)  for chemotherapy treatment.        INTERVAL HISTORY:  Phillip Sanchez 84 y.o.  male pleasant patient above history of extensive stage small cell lung cancer status post 3 cycles of carboplatin etoposide with Gildardo Pounds is here for follow-up.  Patient energy levels are adequate.  Denies any nausea vomiting diarrhea.  Patient has been working out in the yard.  Mild shortness of breath and exertion.  No swelling in the legs.   Review of Systems  Constitutional: Negative for chills, diaphoresis and fever.  HENT: Negative for nosebleeds and sore throat.   Eyes: Negative for double vision.  Respiratory: Negative for hemoptysis, sputum production and wheezing.   Cardiovascular: Negative for chest pain, palpitations and orthopnea.  Gastrointestinal: Negative for abdominal pain, blood in stool, constipation, diarrhea, heartburn, melena, nausea and vomiting.  Genitourinary: Negative for dysuria, frequency and urgency.  Musculoskeletal: Negative for back pain and joint pain.  Skin: Negative.  Negative for itching and rash.  Neurological: Negative for dizziness, tingling, focal weakness, weakness and headaches.  Endo/Heme/Allergies: Does  not bruise/bleed easily.  Psychiatric/Behavioral: Negative for depression. The patient is not nervous/anxious and does not have insomnia.       PAST MEDICAL HISTORY :  Past Medical  History:  Diagnosis Date  . Anemia   . CHF (congestive heart failure) ([place])   . Colon cancer [facility])    He states they removed cancerous polyps.  . Hypercholesteremia   . Hypertension   . Lung cancer ([place])   . Myocardial infarction ([place])   . Osteoarthritis   . Prostate cancer [facility])    Prostatectomy.  . Small cell lung cancer ([place]) 08/04/2017    PAST SURGICAL HISTORY :   Past Surgical History:  Procedure Laterality Date  . COLON SURGERY    . CORONARY ANGIOPLASTY WITH STENT PLACEMENT    . herniorrhaphy    . PORTA CATH INSERTION N/A 08/05/2017   Procedure: PORTA CATH INSERTION;  Surgeon: Katha Cabal, MD;  Location: [place] [facility];  Service: Cardiovascular;  Laterality: N/A;    FAMILY HISTORY :   Family History  Problem Relation Age of Onset  . Cancer Brother 28       unknown orgin  . Cancer Sister 69       unknown type    SOCIAL HISTORY:   Social History   Tobacco Use  . Smoking status: Former Smoker    Packs/day: 1.00    Years: 36.00    Pack years: 36.00    Types: Cigarettes    Last attempt to quit: 1987    Years since quitting: 32.7  . Smokeless tobacco: Never Used  Substance Use Topics  . Alcohol use: No    Alcohol/week: 0.0 standard drinks  . Drug use: No    ALLERGIES:  has No Known Allergies.  MEDICATIONS:  Current Outpatient Medications  Medication Sig Dispense Refill  . amLODipine-benazepril (LOTREL) 10-40 MG capsule Take 1 capsule by mouth daily.    Marland Kitchen aspirin EC 81 MG tablet Take 81 mg by mouth daily.     Marland Kitchen atorvastatin (LIPITOR) 20 MG tablet Take 20 mg by mouth at bedtime.     . calcium carbonate (OS-CAL - DOSED IN MG OF ELEMENTAL CALCIUM) 1250 (500 Ca) MG tablet Take 1 tablet by mouth daily.    . carvedilol (COREG) 25 MG tablet Take 25 mg by mouth 2 (two) times daily.     . cetirizine (ZYRTEC) 10 MG tablet Take 1 tablet by mouth daily.    . clopidogrel (PLAVIX) 75 MG tablet Take 75 mg by mouth daily.     Marland Kitchen docusate sodium (COLACE) 100  MG capsule Take 1 capsule (100 mg total) by mouth daily as needed. 30 capsule 2  . esomeprazole (NEXIUM) 20 MG capsule Take 1 capsule (20 mg total) by mouth daily. 90 capsule 1  . fexofenadine-pseudoephedrine (ALLEGRA-D 24) 180-240 MG per 24 hr tablet Take 1 tablet by mouth daily as needed (for allergies).     . fluticasone (FLONASE) 50 MCG/ACT nasal spray Place 1 spray into the nose daily as needed for allergies or rhinitis.     . Fluticasone-Salmeterol (ADVAIR DISKUS) 500-50 MCG/DOSE AEPB Inhale 1 puff into the lungs 2 (two) times daily. 1 each 3  . furosemide (LASIX) 20 MG tablet Take 1 tablet (20 mg total) by mouth 2 (two) times daily. (Patient taking differently: Take 20 mg by mouth daily. ) 60 tablet 3  . lidocaine-prilocaine (EMLA) cream Apply to affected area once 30 g 3  . Multiple Vitamin (MULTI-VITAMINS) TABS Take  1 tablet by mouth daily.     . ondansetron (ZOFRAN) 8 MG tablet Take 1 tablet (8 mg total) by mouth 2 (two) times daily as needed for refractory nausea / vomiting. Start on day 3 after carboplatin chemo. 30 tablet 1  . polyethylene glycol (MIRALAX / GLYCOLAX) packet Take 17 g by mouth daily. 30 each 0  . potassium chloride 20 MEQ TBCR Take 20 mEq by mouth daily. 60 tablet 3  . prochlorperazine (COMPAZINE) 10 MG tablet Take 1 tablet (10 mg total) by mouth every 6 (six) hours as needed for nausea or vomiting. 40 tablet 1  . ranitidine (ZANTAC) 300 MG tablet Take 300 mg by mouth at bedtime.    Marland Kitchen rOPINIRole (REQUIP) 1 MG tablet Take 1 mg by mouth at bedtime.     . senna (SENOKOT) 8.6 MG TABS tablet Take 1 tablet (8.6 mg total) by mouth daily. 120 each 1  . temazepam (RESTORIL) 15 MG capsule Take 15-30 mg by mouth at bedtime as needed for sleep.     Marland Kitchen Oxycodone HCl 10 MG TABS Take 1 tablet (10 mg total) by mouth every 6 (six) hours as needed. (Patient not taking: Reported on 10/26/2017) 30 tablet 0   No current facility-administered medications for this visit.     Facility-Administered Medications Ordered in Other Visits  Medication Dose Route Frequency Provider Last Rate Last Dose  . etoposide (VEPESID) 200 mg in sodium chloride 0.9 % 500 mL chemo infusion  200 mg Intravenous Once Charlaine Dalton R, MD      . heparin lock flush 100 unit/mL  500 Units Intravenous Once Earlie Server, MD      . heparin lock flush 100 unit/mL  500 Units Intracatheter Once PRN Cammie Sickle, MD      . pegfilgrastim-cbqv [facility]) injection 6 mg  6 mg Subcutaneous Once Charlaine Dalton R, MD      . sodium chloride flush (NS) 0.9 % injection 10 mL  10 mL Intravenous PRN Earlie Server, MD   10 mL at 10/26/17 0815    PHYSICAL EXAMINATION: ECOG PERFORMANCE STATUS: 1 - Symptomatic but completely ambulatory  BP (!) 162/72   Pulse 79   Temp (!) 96.7 F (35.9 C) (Tympanic)   Resp 20   Ht 5\' 10"  (1.778 m)   Wt 170 lb (77.1 kg)   BMI 24.39 kg/m   Filed Weights   10/26/17 0829  Weight: 170 lb (77.1 kg)    Physical Exam  Constitutional: He is oriented to person, place, and time and well-developed, well-nourished, and in no distress.  Elderly male patient; accompanied by his son.  He is in a wheelchair.  HENT:  Head: Normocephalic and atraumatic.  Mouth/Throat: Oropharynx is clear and moist. No oropharyngeal exudate.  Eyes: Pupils are equal, round, and reactive to light.  Neck: Normal range of motion. Neck supple.  Cardiovascular: Normal rate and regular rhythm.  Pulmonary/Chest: No respiratory distress. He has no wheezes.  Decreased breath sounds bilaterally.  Abdominal: Soft. Bowel sounds are normal. He exhibits no distension and no mass. There is no tenderness. There is no rebound and no guarding.  Musculoskeletal: Normal range of motion. He exhibits no edema or tenderness.  Neurological: He is alert and oriented to person, place, and time.  Skin: Skin is warm.  Psychiatric: Affect normal.       LABORATORY DATA:  I have reviewed the data as listed     Component Value Date/Time   NA 141 10/26/2017 0804  NA 143 01/24/2014 1057   K 3.7 10/26/2017 0804   K 3.9 01/24/2014 1057   CL 107 10/26/2017 0804   CL 106 01/24/2014 1057   CO2 27 10/26/2017 0804   CO2 27 01/24/2014 1057   GLUCOSE 144 (H) 10/26/2017 0804   GLUCOSE 105 (H) 01/24/2014 1057   BUN 14 10/26/2017 0804   BUN 24 (H) 01/24/2014 1057   CREATININE 0.98 10/26/2017 0804   CREATININE 1.19 01/24/2014 1057   CALCIUM 8.9 10/26/2017 0804   CALCIUM 8.9 01/24/2014 1057   PROT 6.4 (L) 10/26/2017 0804   PROT 6.8 01/24/2014 1057   ALBUMIN 3.5 10/26/2017 0804   ALBUMIN 3.5 01/24/2014 1057   AST 29 10/26/2017 0804   AST 19 01/24/2014 1057   ALT 18 10/26/2017 0804   ALT 25 01/24/2014 1057   ALKPHOS 65 10/26/2017 0804   ALKPHOS 73 01/24/2014 1057   BILITOT 0.6 10/26/2017 0804   BILITOT 0.6 01/24/2014 1057   GFRNONAA >60 10/26/2017 0804   GFRNONAA >60 01/24/2014 1057   GFRNONAA 50 (L) 10/21/2013 0447   GFRAA >60 10/26/2017 0804   GFRAA >60 01/24/2014 1057   GFRAA 58 (L) 10/21/2013 0447    No results found for: SPEP, UPEP  Lab Results  Component Value Date   WBC 5.9 10/26/2017   NEUTROABS 4.0 10/26/2017   HGB 10.7 (L) 10/26/2017   HCT 31.3 (L) 10/26/2017   MCV 103.0 (H) 10/26/2017   PLT 387 10/26/2017      Chemistry      Component Value Date/Time   NA 141 10/26/2017 0804   NA 143 01/24/2014 1057   K 3.7 10/26/2017 0804   K 3.9 01/24/2014 1057   CL 107 10/26/2017 0804   CL 106 01/24/2014 1057   CO2 27 10/26/2017 0804   CO2 27 01/24/2014 1057   BUN 14 10/26/2017 0804   BUN 24 (H) 01/24/2014 1057   CREATININE 0.98 10/26/2017 0804   CREATININE 1.19 01/24/2014 1057      Component Value Date/Time   CALCIUM 8.9 10/26/2017 0804   CALCIUM 8.9 01/24/2014 1057   ALKPHOS 65 10/26/2017 0804   ALKPHOS 73 01/24/2014 1057   AST 29 10/26/2017 0804   AST 19 01/24/2014 1057   ALT 18 10/26/2017 0804   ALT 25 01/24/2014 1057   BILITOT 0.6 10/26/2017 0804   BILITOT 0.6  01/24/2014 1057       RADIOGRAPHIC STUDIES: I have personally reviewed the radiological images as listed and agreed with the findings in the report. No results found.   ASSESSMENT & PLAN:  [name] #Extensive stage small cell lung cancer-currently on carboplatin and etoposide plus Tecentriq. Aug 8th CT CR-PR.  Currently status post 3 cycles.  Clinically stable.  # proceed with cycle #4 today. Labs today reviewed;  acceptable for treatment today.   # Recent acute CHF s/p admission EF- 40-45%. continue lasix 20 BID/ K 20 . STABLE.   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 MRI in 4 weeks.   # treatment today; follow up in 4 weeks/labs; CT scan prior/MRI prior; Tecentriq alone.    Orders Placed This Encounter  Procedures  . CT CHEST W CONTRAST    Standing Status:   Future    Standing Expiration Date:   10/27/2018    Order Specific Question:   If indicated for the ordered procedure, I authorize the administration of contrast media per Radiology protocol    Answer:  Yes    Order Specific Question:   Preferred imaging location?    Answer:   [facility]    Order Specific Question:   Radiology Contrast Protocol - do NOT remove file path    Answer:   \\[email or web address]    Order Specific Question:   ** REASON FOR EXAM (FREE TEXT)    Answer:   small cell lung cancer- on chemo  . MR Brain W Wo Contrast    Standing Status:   Future    Standing Expiration Date:   10/26/2018    Order Specific Question:   ** REASON FOR EXAM (FREE TEXT)    Answer:   brain mets on chemo    Order Specific Question:   If indicated for the ordered procedure, I authorize the administration of contrast media per Radiology protocol    Answer:   Yes    Order Specific Question:   What is the patient's sedation requirement?    Answer:   No Sedation    Order Specific Question:   Does the patient have a pacemaker or implanted  devices?    Answer:   No    Order Specific Question:   Use SRS Protocol?    Answer:   No    Order Specific Question:   Radiology Contrast Protocol - do NOT remove file path    Answer:   \\[email or web address]    Order Specific Question:   Preferred imaging location?    Answer:   [facility] (table limit-300lbs)   All questions were answered. The patient knows to call the clinic with any problems, questions or concerns.      Cammie Sickle, MD 10/26/2017 1:14 PM

## 2017-10-27 ENCOUNTER — Inpatient Hospital Stay: Payer: Medicare Other

## 2017-10-27 VITALS — BP 167/69 | HR 72 | Temp 97.1°F | Resp 20

## 2017-10-27 DIAGNOSIS — Z5111 Encounter for antineoplastic chemotherapy: Secondary | ICD-10-CM | POA: Diagnosis not present

## 2017-10-27 DIAGNOSIS — C3490 Malignant neoplasm of unspecified part of unspecified bronchus or lung: Secondary | ICD-10-CM

## 2017-10-27 MED ORDER — SODIUM CHLORIDE 0.9% FLUSH
10.0000 mL | INTRAVENOUS | Status: DC | PRN
  Administered 2017-10-27: 10 mL via INTRAVENOUS
  Filled 2017-10-27: qty 10

## 2017-10-27 MED ORDER — SODIUM CHLORIDE 0.9 % IV SOLN: 10.0000 mg | Freq: Once | INTRAVENOUS | Status: DC

## 2017-10-27 MED ORDER — SODIUM CHLORIDE 0.9 % IV SOLN
80.0000 mg/m2 | Freq: Once | INTRAVENOUS | Status: AC
  Administered 2017-10-27: 160 mg via INTRAVENOUS
  Filled 2017-10-27: qty 8

## 2017-10-27 MED ORDER — SODIUM CHLORIDE 0.9 % IV SOLN
Freq: Once | INTRAVENOUS | Status: AC
  Administered 2017-10-27: 14:00:00 via INTRAVENOUS
  Filled 2017-10-27: qty 250

## 2017-10-27 MED ORDER — DEXAMETHASONE SODIUM PHOSPHATE 10 MG/ML IJ SOLN
10.0000 mg | Freq: Once | INTRAMUSCULAR | Status: AC
  Administered 2017-10-27: 10 mg via INTRAVENOUS
  Filled 2017-10-27: qty 1

## 2017-10-27 MED ORDER — HEPARIN SOD (PORK) LOCK FLUSH 100 UNIT/ML IV SOLN
500.0000 [IU] | Freq: Once | INTRAVENOUS | Status: AC
  Administered 2017-10-27: 500 [IU] via INTRAVENOUS

## 2017-10-28 ENCOUNTER — Inpatient Hospital Stay: Payer: Medicare Other

## 2017-10-28 VITALS — BP 142/72 | HR 72 | Temp 97.6°F | Resp 20

## 2017-10-28 DIAGNOSIS — Z5111 Encounter for antineoplastic chemotherapy: Secondary | ICD-10-CM | POA: Diagnosis not present

## 2017-10-28 DIAGNOSIS — C3490 Malignant neoplasm of unspecified part of unspecified bronchus or lung: Secondary | ICD-10-CM

## 2017-10-28 MED ORDER — SODIUM CHLORIDE 0.9 % IV SOLN
80.0000 mg/m2 | Freq: Once | INTRAVENOUS | Status: AC
  Administered 2017-10-28: 160 mg via INTRAVENOUS
  Filled 2017-10-28: qty 8

## 2017-10-28 MED ORDER — SODIUM CHLORIDE 0.9 % IV SOLN: 10.0000 mg | Freq: Once | INTRAVENOUS | Status: DC

## 2017-10-28 MED ORDER — HEPARIN SOD (PORK) LOCK FLUSH 100 UNIT/ML IV SOLN
INTRAVENOUS | Status: AC
  Filled 2017-10-28: qty 5

## 2017-10-28 MED ORDER — HEPARIN SOD (PORK) LOCK FLUSH 100 UNIT/ML IV SOLN
500.0000 [IU] | Freq: Once | INTRAVENOUS | Status: AC
  Administered 2017-10-28: 500 [IU] via INTRAVENOUS

## 2017-10-28 MED ORDER — DEXAMETHASONE SODIUM PHOSPHATE 10 MG/ML IJ SOLN
10.0000 mg | Freq: Once | INTRAMUSCULAR | Status: AC
  Administered 2017-10-28: 10 mg via INTRAVENOUS
  Filled 2017-10-28: qty 1

## 2017-10-28 MED ORDER — SODIUM CHLORIDE 0.9 % IV SOLN
Freq: Once | INTRAVENOUS | Status: AC
  Administered 2017-10-28: 14:00:00 via INTRAVENOUS
  Filled 2017-10-28: qty 250

## 2017-10-28 MED ORDER — SODIUM CHLORIDE 0.9% FLUSH
10.0000 mL | INTRAVENOUS | Status: DC | PRN
  Administered 2017-10-28: 10 mL via INTRAVENOUS
  Filled 2017-10-28: qty 10

## 2017-10-31 ENCOUNTER — Inpatient Hospital Stay: Payer: Medicare Other

## 2017-10-31 DIAGNOSIS — Z5111 Encounter for antineoplastic chemotherapy: Secondary | ICD-10-CM | POA: Diagnosis not present

## 2017-10-31 DIAGNOSIS — C3490 Malignant neoplasm of unspecified part of unspecified bronchus or lung: Secondary | ICD-10-CM

## 2017-10-31 MED ORDER — PEGFILGRASTIM-CBQV 6 MG/0.6ML ~~LOC~~ SOSY
6.0000 mg | PREFILLED_SYRINGE | Freq: Once | SUBCUTANEOUS | Status: AC
  Administered 2017-10-31: 6 mg via SUBCUTANEOUS

## 2017-11-02 ENCOUNTER — Ambulatory Visit: Payer: Medicare Other | Admitting: Family

## 2017-11-21 ENCOUNTER — Ambulatory Visit
Admission: RE | Admit: 2017-11-21 | Discharge: 2017-11-21 | Disposition: A | Discharge status: Home / Self Care | Payer: Medicare Other | Source: Ambulatory Visit | Attending: Internal Medicine | Admitting: Internal Medicine

## 2017-11-21 DIAGNOSIS — C7951 Secondary malignant neoplasm of bone: Secondary | ICD-10-CM | POA: Insufficient documentation

## 2017-11-21 DIAGNOSIS — J90 Pleural effusion, not elsewhere classified: Secondary | ICD-10-CM | POA: Diagnosis not present

## 2017-11-21 DIAGNOSIS — C787 Secondary malignant neoplasm of liver and intrahepatic bile duct: Secondary | ICD-10-CM | POA: Diagnosis not present

## 2017-11-21 DIAGNOSIS — C3432 Malignant neoplasm of lower lobe, left bronchus or lung: Secondary | ICD-10-CM | POA: Diagnosis present

## 2017-11-21 DIAGNOSIS — I2510 Atherosclerotic heart disease of native coronary artery without angina pectoris: Secondary | ICD-10-CM | POA: Insufficient documentation

## 2017-11-21 DIAGNOSIS — I700 Atherosclerosis of aorta: Secondary | ICD-10-CM | POA: Diagnosis not present

## 2017-11-21 MED ORDER — IOPAMIDOL (ISOVUE-300) INJECTION 61%
75.0000 mL | Freq: Once | INTRAVENOUS | Status: AC | PRN
  Administered 2017-11-21: 60 mL via INTRAVENOUS

## 2017-11-23 ENCOUNTER — Inpatient Hospital Stay (HOSPITAL_BASED_OUTPATIENT_CLINIC_OR_DEPARTMENT_OTHER): Payer: Medicare Other | Admitting: Internal Medicine

## 2017-11-23 ENCOUNTER — Inpatient Hospital Stay: Payer: Medicare Other | Attending: Internal Medicine

## 2017-11-23 ENCOUNTER — Inpatient Hospital Stay: Payer: Medicare Other

## 2017-11-23 VITALS — BP 127/68 | HR 69 | Temp 97.4°F | Resp 16 | Wt 171.6 lb

## 2017-11-23 DIAGNOSIS — C7931 Secondary malignant neoplasm of brain: Secondary | ICD-10-CM | POA: Insufficient documentation

## 2017-11-23 DIAGNOSIS — C3432 Malignant neoplasm of lower lobe, left bronchus or lung: Secondary | ICD-10-CM

## 2017-11-23 DIAGNOSIS — Z23 Encounter for immunization: Secondary | ICD-10-CM

## 2017-11-23 DIAGNOSIS — Z87891 Personal history of nicotine dependence: Secondary | ICD-10-CM

## 2017-11-23 DIAGNOSIS — Z8546 Personal history of malignant neoplasm of prostate: Secondary | ICD-10-CM

## 2017-11-23 DIAGNOSIS — Z85038 Personal history of other malignant neoplasm of large intestine: Secondary | ICD-10-CM

## 2017-11-23 DIAGNOSIS — Z9079 Acquired absence of other genital organ(s): Secondary | ICD-10-CM

## 2017-11-23 DIAGNOSIS — I10 Essential (primary) hypertension: Secondary | ICD-10-CM | POA: Diagnosis not present

## 2017-11-23 DIAGNOSIS — Z5111 Encounter for antineoplastic chemotherapy: Secondary | ICD-10-CM | POA: Insufficient documentation

## 2017-11-23 LAB — COMPREHENSIVE METABOLIC PANEL
ALT: 13 U/L (ref 0–44)
AST: 22 U/L (ref 15–41)
Albumin: 3.6 g/dL (ref 3.5–5.0)
Alkaline Phosphatase: 69 U/L (ref 38–126)
Anion gap: 9 (ref 5–15)
BILIRUBIN TOTAL: 0.6 mg/dL (ref 0.3–1.2)
BUN: 22 mg/dL (ref 8–23)
CO2: 27 mmol/L (ref 22–32)
CREATININE: 1.01 mg/dL (ref 0.61–1.24)
Calcium: 9.2 mg/dL (ref 8.9–10.3)
Chloride: 105 mmol/L (ref 98–111)
Glucose, Bld: 140 mg/dL — ABNORMAL HIGH (ref 70–99)
Potassium: 3.6 mmol/L (ref 3.5–5.1)
Sodium: 141 mmol/L (ref 135–145)
TOTAL PROTEIN: 6.6 g/dL (ref 6.5–8.1)

## 2017-11-23 LAB — CBC WITH DIFFERENTIAL/PLATELET
ABS IMMATURE GRANULOCYTES: 0.03 10*3/uL (ref 0.00–0.07)
BASOS PCT: 2 %
Basophils Absolute: 0.2 10*3/uL — ABNORMAL HIGH (ref 0.0–0.1)
EOS ABS: 0.1 10*3/uL (ref 0.0–0.5)
Eosinophils Relative: 1 %
HEMATOCRIT: 31.4 % — AB (ref 39.0–52.0)
Hemoglobin: 10 g/dL — ABNORMAL LOW (ref 13.0–17.0)
IMMATURE GRANULOCYTES: 0 %
LYMPHS ABS: 0.7 10*3/uL (ref 0.7–4.0)
Lymphocytes Relative: 11 %
MCH: 34 pg (ref 26.0–34.0)
MCHC: 31.8 g/dL (ref 30.0–36.0)
MCV: 106.8 fL — AB (ref 80.0–100.0)
MONOS PCT: 15 %
Monocytes Absolute: 1.1 10*3/uL — ABNORMAL HIGH (ref 0.1–1.0)
NEUTROS PCT: 71 %
Neutro Abs: 4.9 10*3/uL (ref 1.7–7.7)
Platelets: 253 10*3/uL (ref 150–400)
RBC: 2.94 MIL/uL — ABNORMAL LOW (ref 4.22–5.81)
RDW: 16.6 % — AB (ref 11.5–15.5)
WBC: 7 10*3/uL (ref 4.0–10.5)
nRBC: 0 % (ref 0.0–0.2)

## 2017-11-23 MED ORDER — INFLUENZA VAC SPLIT HIGH-DOSE 0.5 ML IM SUSY
0.5000 mL | PREFILLED_SYRINGE | Freq: Once | INTRAMUSCULAR | Status: AC
  Administered 2017-11-23: 0.5 mL via INTRAMUSCULAR
  Filled 2017-11-23: qty 0.5

## 2017-11-23 MED ORDER — SODIUM CHLORIDE 0.9% FLUSH
10.0000 mL | INTRAVENOUS | Status: DC | PRN
  Administered 2017-11-23: 10 mL via INTRAVENOUS
  Filled 2017-11-23: qty 10

## 2017-11-23 MED ORDER — SODIUM CHLORIDE 0.9 % IV SOLN
Freq: Once | INTRAVENOUS | Status: AC
  Administered 2017-11-23: 10:00:00 via INTRAVENOUS
  Filled 2017-11-23: qty 250

## 2017-11-23 MED ORDER — HEPARIN SOD (PORK) LOCK FLUSH 100 UNIT/ML IV SOLN
500.0000 [IU] | Freq: Once | INTRAVENOUS | Status: AC
  Administered 2017-11-23: 500 [IU] via INTRAVENOUS

## 2017-11-23 MED ORDER — SODIUM CHLORIDE 0.9 % IV SOLN
1200.0000 mg | Freq: Once | INTRAVENOUS | Status: AC
  Administered 2017-11-23: 1200 mg via INTRAVENOUS
  Filled 2017-11-23: qty 20

## 2017-11-23 NOTE — Progress Notes (Signed)
ON PATHWAY REGIMEN - Small Cell Lung  No Change  Continue With Treatment as Ordered.     Cycles 1 through 4, every 21 days:     Atezolizumab      Carboplatin      Etoposide    Cycles 5 and beyond, every 21 days:     Atezolizumab   **Always confirm dose/schedule in your pharmacy ordering system**  Patient Characteristics: Extensive Stage, First Line Stage Classification: Extensive AJCC T Category: [place] AJCC N Category: NX AJCC M Category: M1c AJCC 8 Stage Grouping: IVB Line of therapy: First Line  Intent of Therapy: Non-Curative / Palliative Intent, Discussed with Patient

## 2017-11-23 NOTE — Progress Notes (Signed)
[facility] OFFICE PROGRESS NOTE  Patient Care Team: Idelle Crouch, MD as PCP - General (Internal Medicine) Telford Nab, RN as Registered Nurse Fath, Javier Docker, MD as Consulting Physician (Cardiology) Cammie Sickle, MD as Medical Oncologist (Medical Oncology)  Cancer Staging No matching staging information was found for the patient.   Oncology History   # 2015- [facility] [place] ca stage I s/p SBRT [Dr.Crystal]  # 3rd June 2019- Multiple liver lesions- s/p Bx- SMALL CELL of lung origin; STAGE IV;  # June 26th- carbo-etop-atezo  # Prostate cancer [ 16 years ago]; s/p surgery  # colon cancer [s/p resection of malignant polyps; Dr/Elliot; No colectomy]  # hx CAD [asprin/plavix]; hx PVD  -----------------------------------------------   DIAGNOSIS: Small cell lung cancer  STAGE:  IV     ;GOALS: Palliative  CURRENT/MOST RECENT THERAPY: carbo-etop-Atezo      Cancer of lower lobe of left lung ([place])   11/22/2017 -  Chemotherapy    The patient had atezolizumab (TECENTRIQ) 1,200 mg in sodium chloride 0.9 % 250 mL chemo infusion, 1,200 mg, Intravenous, Once, 1 of 6 cycles  for chemotherapy treatment.      Small cell lung cancer ([place])   08/04/2017 Initial Diagnosis    Small cell lung cancer ([place])    08/04/2017 -  Chemotherapy    The patient had palonosetron (ALOXI) injection 0.25 mg, 0.25 mg, Intravenous,  Once, 4 of 4 cycles Administration: 0.25 mg (08/10/2017), 0.25 mg (09/07/2017), 0.25 mg (10/04/2017), 0.25 mg (10/26/2017) pegfilgrastim-cbqv (UDENYCA) injection 6 mg, 6 mg, Subcutaneous, Once, 4 of 4 cycles Administration: 6 mg (08/15/2017), 6 mg (10/07/2017), 6 mg (10/31/2017) CARBOplatin (PARAPLATIN) 340 mg in sodium chloride 0.9 % 250 mL chemo infusion, 340 mg (100 % of original dose 340.5 mg), Intravenous,  Once, 4 of 4 cycles Dose modification:   (original dose 340.5 mg, Cycle 1),   (original dose 431 mg, Cycle 2) Administration: 340 mg (08/10/2017), 430 mg  (09/07/2017), 430 mg (10/04/2017), 430 mg (10/26/2017) etoposide (VEPESID) 200 mg in sodium chloride 0.9 % 500 mL chemo infusion, 210 mg, Intravenous,  Once, 4 of 4 cycles Dose modification: 80 mg/m2 (original dose 100 mg/m2, Cycle 3, Reason: Provider Judgment) Administration: 200 mg (08/10/2017), 200 mg (08/11/2017), 200 mg (08/12/2017), 200 mg (09/07/2017), 200 mg (09/08/2017), 160 mg (10/04/2017), 160 mg (10/05/2017), 160 mg (10/06/2017), 160 mg (10/26/2017), 160 mg (10/27/2017), 160 mg (10/28/2017) fosaprepitant (EMEND) 150 mg, dexamethasone (DECADRON) 12 mg in sodium chloride 0.9 % 145 mL IVPB, , Intravenous,  Once, 4 of 4 cycles Administration:  (08/10/2017),  (09/07/2017),  (10/04/2017),  (10/26/2017) atezolizumab (TECENTRIQ) 1,200 mg in sodium chloride 0.9 % 250 mL chemo infusion, 1,200 mg, Intravenous, Once, 4 of 4 cycles Administration: 1,200 mg (08/10/2017), 1,200 mg (09/07/2017), 1,200 mg (10/04/2017), 1,200 mg (10/26/2017)  for chemotherapy treatment.        INTERVAL HISTORY:  SPARSH CALLENS 84 y.o.  male pleasant patient above history of extensive stage small cell lung cancer status post 4 cycles of carboplatin etoposide with Phillip Sanchez is here for follow-up/review the results of the restaging CAT scan.  Patient appetite is good.  Denies any nausea vomiting or diarrhea.  As per the son patient washed his own car.  He does get short of breath and significant exertion.  Otherwise stable.  No swelling in the legs.   Review of Systems  Constitutional: Negative for chills, diaphoresis and fever.  HENT: Negative for nosebleeds and sore throat.   Eyes: Negative for double  vision.  Respiratory: Negative for hemoptysis, sputum production and wheezing.   Cardiovascular: Negative for chest pain, palpitations and orthopnea.  Gastrointestinal: Negative for abdominal pain, blood in stool, constipation, diarrhea, heartburn, melena, nausea and vomiting.  Genitourinary: Negative for dysuria, frequency and urgency.   Musculoskeletal: Negative for back pain and joint pain.  Skin: Negative.  Negative for itching and rash.  Neurological: Negative for dizziness, tingling, focal weakness, weakness and headaches.  Endo/Heme/Allergies: Does not bruise/bleed easily.  Psychiatric/Behavioral: Negative for depression. The patient is not nervous/anxious and does not have insomnia.       PAST MEDICAL HISTORY :  Past Medical History:  Diagnosis Date  . Anemia   . CHF (congestive heart failure) ([place])   . Colon cancer [facility])    He states they removed cancerous polyps.  . Hypercholesteremia   . Hypertension   . Lung cancer ([place])   . Myocardial infarction ([place])   . Osteoarthritis   . Prostate cancer [facility])    Prostatectomy.  . Small cell lung cancer ([place]) 08/04/2017    PAST SURGICAL HISTORY :   Past Surgical History:  Procedure Laterality Date  . COLON SURGERY    . CORONARY ANGIOPLASTY WITH STENT PLACEMENT    . herniorrhaphy    . PORTA CATH INSERTION N/A 08/05/2017   Procedure: PORTA CATH INSERTION;  Surgeon: Katha Cabal, MD;  Location: [facility];  Service: Cardiovascular;  Laterality: N/A;    FAMILY HISTORY :   Family History  Problem Relation Age of Onset  . Cancer Brother 91       unknown orgin  . Cancer Sister 54       unknown type    SOCIAL HISTORY:   Social History   Tobacco Use  . Smoking status: Former Smoker    Packs/day: 1.00    Years: 36.00    Pack years: 36.00    Types: Cigarettes    Last attempt to quit: 1987    Years since quitting: 32.7  . Smokeless tobacco: Never Used  Substance Use Topics  . Alcohol use: No    Alcohol/week: 0.0 standard drinks  . Drug use: No    ALLERGIES:  has No Known Allergies.  MEDICATIONS:  Current Outpatient Medications  Medication Sig Dispense Refill  . amLODipine-benazepril (LOTREL) 10-40 MG capsule Take 1 capsule by mouth daily.    Marland Kitchen aspirin EC 81 MG tablet Take 81 mg by mouth daily.     Marland Kitchen atorvastatin (LIPITOR) 20 MG  tablet Take 20 mg by mouth at bedtime.     . calcium carbonate (OS-CAL - DOSED IN MG OF ELEMENTAL CALCIUM) 1250 (500 Ca) MG tablet Take 1 tablet by mouth daily.    . carvedilol (COREG) 25 MG tablet Take 25 mg by mouth 2 (two) times daily.     . cetirizine (ZYRTEC) 10 MG tablet Take 1 tablet by mouth daily.    . clopidogrel (PLAVIX) 75 MG tablet Take 75 mg by mouth daily.     Marland Kitchen docusate sodium (COLACE) 100 MG capsule Take 1 capsule (100 mg total) by mouth daily as needed. 30 capsule 2  . esomeprazole (NEXIUM) 20 MG capsule Take 1 capsule (20 mg total) by mouth daily. 90 capsule 1  . fexofenadine-pseudoephedrine (ALLEGRA-D 24) 180-240 MG per 24 hr tablet Take 1 tablet by mouth daily as needed (for allergies).     . fluticasone (FLONASE) 50 MCG/ACT nasal spray Place 1 spray into the nose daily as needed for allergies or rhinitis.     Marland Kitchen  Fluticasone-Salmeterol (ADVAIR DISKUS) 500-50 MCG/DOSE AEPB Inhale 1 puff into the lungs 2 (two) times daily. 1 each 3  . furosemide (LASIX) 20 MG tablet Take 1 tablet (20 mg total) by mouth 2 (two) times daily. (Patient taking differently: Take 20 mg by mouth daily. ) 60 tablet 3  . lidocaine-prilocaine (EMLA) cream Apply to affected area once 30 g 3  . Multiple Vitamin (MULTI-VITAMINS) TABS Take 1 tablet by mouth daily.     . polyethylene glycol (MIRALAX / GLYCOLAX) packet Take 17 g by mouth daily. 30 each 0  . potassium chloride 20 MEQ TBCR Take 20 mEq by mouth daily. 60 tablet 3  . prochlorperazine (COMPAZINE) 10 MG tablet Take 1 tablet (10 mg total) by mouth every 6 (six) hours as needed for nausea or vomiting. 40 tablet 1  . rOPINIRole (REQUIP) 1 MG tablet Take 1 mg by mouth at bedtime.     . senna (SENOKOT) 8.6 MG TABS tablet Take 1 tablet (8.6 mg total) by mouth daily. 120 each 1  . temazepam (RESTORIL) 15 MG capsule Take 15-30 mg by mouth at bedtime as needed for sleep.     Marland Kitchen ondansetron (ZOFRAN) 8 MG tablet Take 1 tablet (8 mg total) by mouth 2 (two) times  daily as needed for refractory nausea / vomiting. Start on day 3 after carboplatin chemo. (Patient not taking: Reported on 11/23/2017) 30 tablet 1  . Oxycodone HCl 10 MG TABS Take 1 tablet (10 mg total) by mouth every 6 (six) hours as needed. (Patient not taking: Reported on 10/26/2017) 30 tablet 0  . ranitidine (ZANTAC) 300 MG tablet Take 300 mg by mouth at bedtime.     No current facility-administered medications for this visit.    Facility-Administered Medications Ordered in Other Visits  Medication Dose Route Frequency Provider Last Rate Last Dose  . atezolizumab (TECENTRIQ) 1,200 mg in sodium chloride 0.9 % 250 mL chemo infusion  1,200 mg Intravenous Once Charlaine Dalton R, MD      . etoposide (VEPESID) 200 mg in sodium chloride 0.9 % 500 mL chemo infusion  200 mg Intravenous Once Charlaine Dalton R, MD      . heparin lock flush 100 unit/mL  500 Units Intravenous Once Earlie Server, MD      . Influenza vac split quadrivalent PF (FLUZONE HIGH-DOSE) injection 0.5 mL  0.5 mL Intramuscular Once Cammie Sickle, MD      . pegfilgrastim-cbqv [facility]) injection 6 mg  6 mg Subcutaneous Once Charlaine Dalton R, MD      . sodium chloride flush (NS) 0.9 % injection 10 mL  10 mL Intravenous PRN Earlie Server, MD   10 mL at 11/23/17 0848    PHYSICAL EXAMINATION: ECOG PERFORMANCE STATUS: 1 - Symptomatic but completely ambulatory  BP 127/68 (BP Location: Left Arm, Patient Position: Sitting)   Pulse 69   Temp (!) 97.4 F (36.3 C) (Tympanic)   Resp 16   Wt 171 lb 9.6 oz (77.8 kg)   BMI 24.62 kg/m   Filed Weights   11/23/17 0858  Weight: 171 lb 9.6 oz (77.8 kg)    Physical Exam  Constitutional: He is oriented to person, place, and time and well-developed, well-nourished, and in no distress.  He is walking by himself.Elderly male patient; accompanied by his son.   HENT:  Head: Normocephalic and atraumatic.  Mouth/Throat: Oropharynx is clear and moist. No oropharyngeal exudate.  Eyes:  Pupils are equal, round, and reactive to light.  Neck: Normal range of  motion. Neck supple.  Cardiovascular: Normal rate and regular rhythm.  Pulmonary/Chest: No respiratory distress. He has no wheezes.  Decreased breath sounds bilaterally.  Abdominal: Soft. Bowel sounds are normal. He exhibits no distension and no mass. There is no tenderness. There is no rebound and no guarding.  Musculoskeletal: Normal range of motion. He exhibits no edema or tenderness.  Neurological: He is alert and oriented to person, place, and time.  Skin: Skin is warm.  Psychiatric: Affect normal.       LABORATORY DATA:  I have reviewed the data as listed    Component Value Date/Time   NA 141 11/23/2017 0857   NA 143 01/24/2014 1057   K 3.6 11/23/2017 0857   K 3.9 01/24/2014 1057   CL 105 11/23/2017 0857   CL 106 01/24/2014 1057   CO2 27 11/23/2017 0857   CO2 27 01/24/2014 1057   GLUCOSE 140 (H) 11/23/2017 0857   GLUCOSE 105 (H) 01/24/2014 1057   BUN 22 11/23/2017 0857   BUN 24 (H) 01/24/2014 1057   CREATININE 1.01 11/23/2017 0857   CREATININE 1.19 01/24/2014 1057   CALCIUM 9.2 11/23/2017 0857   CALCIUM 8.9 01/24/2014 1057   PROT 6.6 11/23/2017 0857   PROT 6.8 01/24/2014 1057   ALBUMIN 3.6 11/23/2017 0857   ALBUMIN 3.5 01/24/2014 1057   AST 22 11/23/2017 0857   AST 19 01/24/2014 1057   ALT 13 11/23/2017 0857   ALT 25 01/24/2014 1057   ALKPHOS 69 11/23/2017 0857   ALKPHOS 73 01/24/2014 1057   BILITOT 0.6 11/23/2017 0857   BILITOT 0.6 01/24/2014 1057   GFRNONAA >60 11/23/2017 0857   GFRNONAA >60 01/24/2014 1057   GFRNONAA 50 (L) 10/21/2013 0447   GFRAA >60 11/23/2017 0857   GFRAA >60 01/24/2014 1057   GFRAA 58 (L) 10/21/2013 0447    No results found for: SPEP, UPEP  Lab Results  Component Value Date   WBC 7.0 11/23/2017   NEUTROABS 4.9 11/23/2017   HGB 10.0 (L) 11/23/2017   HCT 31.4 (L) 11/23/2017   MCV 106.8 (H) 11/23/2017   PLT 253 11/23/2017      Chemistry      Component  Value Date/Time   NA 141 11/23/2017 0857   NA 143 01/24/2014 1057   K 3.6 11/23/2017 0857   K 3.9 01/24/2014 1057   CL 105 11/23/2017 0857   CL 106 01/24/2014 1057   CO2 27 11/23/2017 0857   CO2 27 01/24/2014 1057   BUN 22 11/23/2017 0857   BUN 24 (H) 01/24/2014 1057   CREATININE 1.01 11/23/2017 0857   CREATININE 1.19 01/24/2014 1057      Component Value Date/Time   CALCIUM 9.2 11/23/2017 0857   CALCIUM 8.9 01/24/2014 1057   ALKPHOS 69 11/23/2017 0857   ALKPHOS 73 01/24/2014 1057   AST 22 11/23/2017 0857   AST 19 01/24/2014 1057   ALT 13 11/23/2017 0857   ALT 25 01/24/2014 1057   BILITOT 0.6 11/23/2017 0857   BILITOT 0.6 01/24/2014 1057       RADIOGRAPHIC STUDIES: I have personally reviewed the radiological images as listed and agreed with the findings in the report. No results found.   ASSESSMENT & PLAN:  [name] #Extensive stage small cell lung cancer-currently on carboplatin and etoposide plus Tecentriq.  Currently status post 4 cycles; CT chest- STABLE: one liver lesion bigger.  Over all STABLE.   # proceed with tecentriq. Labs today reviewed;  acceptable  for treatment today.   # Recent acute CHF s/p admission EF- 40-45%. continue lasix 20 BID/ K 20 . STABLE.   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 Will order MRI at next visit.   DISPOSITION:  # treatment today; #follow up in 3 weeks- MD/labs [cbc/cmp/TSH]Tecentriq- Dr.B   No orders of the defined types were placed in this encounter.  All questions were answered. The patient knows to call the clinic with any problems, questions or concerns.      Cammie Sickle, MD 11/23/2017 10:46 AM

## 2017-11-23 NOTE — Assessment & Plan Note (Addendum)
#  Extensive stage small cell lung cancer-currently on carboplatin and etoposide plus Tecentriq.  Currently status post 4 cycles; CT chest- STABLE: one liver lesion bigger [31mm]. Over all STABLE.    # Continue Tecentriq for now . Discussed unfortunately patient does not have good options if he does progress on current therapy.  We will plan to get repeat imaging in the next 2 months or so.  # proceed with tecentriq. Labs today reviewed;  acceptable for treatment today.    # Recent acute CHF s/p admission EF- 40-45%. continue lasix 20 BID/ K 20 . STABLE.   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 Will order MRI at next visit.   DISPOSITION:  # treatment today; #follow up in 3 weeks- MD/labs [cbc/cmp/TSH]Tecentriq- Dr.B  # I reviewed the blood work- with the patient in detail; also reviewed the imaging independently [as summarized above]; and with the patient in detail.

## 2017-11-24 ENCOUNTER — Inpatient Hospital Stay: Payer: Medicare Other

## 2017-11-25 ENCOUNTER — Ambulatory Visit: Payer: Medicare Other

## 2017-12-14 ENCOUNTER — Inpatient Hospital Stay: Payer: Medicare Other

## 2017-12-14 ENCOUNTER — Inpatient Hospital Stay (HOSPITAL_BASED_OUTPATIENT_CLINIC_OR_DEPARTMENT_OTHER): Payer: Medicare Other | Admitting: Internal Medicine

## 2017-12-14 VITALS — BP 138/67 | HR 98 | Temp 97.1°F | Resp 16 | Wt 173.6 lb

## 2017-12-14 DIAGNOSIS — I509 Heart failure, unspecified: Secondary | ICD-10-CM | POA: Diagnosis not present

## 2017-12-14 DIAGNOSIS — R300 Dysuria: Secondary | ICD-10-CM | POA: Insufficient documentation

## 2017-12-14 DIAGNOSIS — C7931 Secondary malignant neoplasm of brain: Secondary | ICD-10-CM

## 2017-12-14 DIAGNOSIS — R350 Frequency of micturition: Secondary | ICD-10-CM

## 2017-12-14 DIAGNOSIS — C3432 Malignant neoplasm of lower lobe, left bronchus or lung: Secondary | ICD-10-CM

## 2017-12-14 DIAGNOSIS — Z8546 Personal history of malignant neoplasm of prostate: Secondary | ICD-10-CM

## 2017-12-14 DIAGNOSIS — Z87891 Personal history of nicotine dependence: Secondary | ICD-10-CM

## 2017-12-14 DIAGNOSIS — Z85038 Personal history of other malignant neoplasm of large intestine: Secondary | ICD-10-CM

## 2017-12-14 DIAGNOSIS — Z5111 Encounter for antineoplastic chemotherapy: Secondary | ICD-10-CM | POA: Diagnosis not present

## 2017-12-14 DIAGNOSIS — Z9079 Acquired absence of other genital organ(s): Secondary | ICD-10-CM

## 2017-12-14 DIAGNOSIS — R3915 Urgency of urination: Secondary | ICD-10-CM

## 2017-12-14 LAB — CBC WITH DIFFERENTIAL/PLATELET
Abs Immature Granulocytes: 0.01 10*3/uL (ref 0.00–0.07)
Basophils Absolute: 0.2 10*3/uL — ABNORMAL HIGH (ref 0.0–0.1)
Basophils Relative: 3 %
EOS ABS: 0.7 10*3/uL — AB (ref 0.0–0.5)
EOS PCT: 13 %
HEMATOCRIT: 31.9 % — AB (ref 39.0–52.0)
Hemoglobin: 10.6 g/dL — ABNORMAL LOW (ref 13.0–17.0)
Immature Granulocytes: 0 %
Lymphocytes Relative: 17 %
Lymphs Abs: 0.9 10*3/uL (ref 0.7–4.0)
MCH: 35 pg — AB (ref 26.0–34.0)
MCHC: 33.2 g/dL (ref 30.0–36.0)
MCV: 105.3 fL — ABNORMAL HIGH (ref 80.0–100.0)
Monocytes Absolute: 0.4 10*3/uL (ref 0.1–1.0)
Monocytes Relative: 8 %
Neutro Abs: 3 10*3/uL (ref 1.7–7.7)
Neutrophils Relative %: 59 %
Platelets: 171 10*3/uL (ref 150–400)
RBC: 3.03 MIL/uL — AB (ref 4.22–5.81)
RDW: 13.8 % (ref 11.5–15.5)
WBC: 5.1 10*3/uL (ref 4.0–10.5)
nRBC: 0 % (ref 0.0–0.2)

## 2017-12-14 LAB — COMPREHENSIVE METABOLIC PANEL
ALK PHOS: 54 U/L (ref 38–126)
ALT: 14 U/L (ref 0–44)
AST: 25 U/L (ref 15–41)
Albumin: 3.4 g/dL — ABNORMAL LOW (ref 3.5–5.0)
Anion gap: 7 (ref 5–15)
BUN: 20 mg/dL (ref 8–23)
CALCIUM: 8.8 mg/dL — AB (ref 8.9–10.3)
CO2: 27 mmol/L (ref 22–32)
CREATININE: 1.08 mg/dL (ref 0.61–1.24)
Chloride: 105 mmol/L (ref 98–111)
GFR, EST NON AFRICAN AMERICAN: 59 mL/min — AB (ref >60)
Glucose, Bld: 138 mg/dL — ABNORMAL HIGH (ref 70–99)
Potassium: 3.7 mmol/L (ref 3.5–5.1)
Sodium: 139 mmol/L (ref 135–145)
TOTAL PROTEIN: 6.4 g/dL — AB (ref 6.5–8.1)
Total Bilirubin: 0.6 mg/dL (ref 0.3–1.2)

## 2017-12-14 MED ORDER — SODIUM CHLORIDE 0.9 % IV SOLN
Freq: Once | INTRAVENOUS | Status: AC
  Administered 2017-12-14: 10:00:00 via INTRAVENOUS
  Filled 2017-12-14: qty 250

## 2017-12-14 MED ORDER — SODIUM CHLORIDE 0.9 % IV SOLN
1200.0000 mg | Freq: Once | INTRAVENOUS | Status: AC
  Administered 2017-12-14: 1200 mg via INTRAVENOUS
  Filled 2017-12-14: qty 20

## 2017-12-14 MED ORDER — HEPARIN SOD (PORK) LOCK FLUSH 100 UNIT/ML IV SOLN
500.0000 [IU] | Freq: Once | INTRAVENOUS | Status: AC | PRN
  Administered 2017-12-14: 500 [IU]
  Filled 2017-12-14: qty 5

## 2017-12-14 NOTE — Assessment & Plan Note (Addendum)
#  Extensive stage small cell lung cancer- status post 4 cycles carbo-etop-tecentriq- CT chest- STABLE: one liver lesion bigger [68mm]. Over all STABLE.  Currently on Tecentriq maintenance.   # Continue Tecentriq for now. Labs today reviewed;  acceptable for treatment today.   # CHF s/p admission EF- 40-45%. continue lasix 20 BID/ K 20 . STABLE.   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 Ordered MRI brain today.   DISPOSITION:  # treatment today; #follow up in 3 weeks- MD/labs [cbc/cmp] Tecentriq; MRI brain Prior- Dr.B

## 2017-12-14 NOTE — Progress Notes (Signed)
[facility] OFFICE PROGRESS NOTE  Patient Care Team: Idelle Crouch, MD as PCP - General (Internal Medicine) Telford Nab, RN as Registered Nurse Fath, Javier Docker, MD as Consulting Physician (Cardiology) Cammie Sickle, MD as Medical Oncologist (Medical Oncology)  Cancer Staging No matching staging information was found for the patient.   Oncology History   # 2015- [facility] [place] ca stage I s/p SBRT [Dr.Crystal]  # 3rd June 2019- Multiple liver lesions- s/p Bx- SMALL CELL of lung origin; STAGE IV;  # June 26th- carbo-etop-atezo  # Prostate cancer [ 44 years ago]; s/p surgery  # colon cancer [s/p resection of malignant polyps; Dr/Elliot; No colectomy]  # hx CAD [asprin/plavix]; hx PVD  -----------------------------------------------   DIAGNOSIS: Small cell lung cancer  STAGE:  IV   ;GOALS: Palliative  CURRENT/MOST RECENT THERAPY: carbo-etop-Atezo      Cancer of lower lobe of left lung ([place])   11/22/2017 -  Chemotherapy    The patient had atezolizumab (TECENTRIQ) 1,200 mg in sodium chloride 0.9 % 250 mL chemo infusion, 1,200 mg, Intravenous, Once, 2 of 6 cycles Administration: 1,200 mg (11/23/2017)  for chemotherapy treatment.      Small cell lung cancer ([place])   08/04/2017 Initial Diagnosis    Small cell lung cancer ([place])    08/04/2017 - 11/17/2017 Chemotherapy    The patient had palonosetron (ALOXI) injection 0.25 mg, 0.25 mg, Intravenous,  Once, 4 of 4 cycles Administration: 0.25 mg (08/10/2017), 0.25 mg (09/07/2017), 0.25 mg (10/04/2017), 0.25 mg (10/26/2017) pegfilgrastim-cbqv (UDENYCA) injection 6 mg, 6 mg, Subcutaneous, Once, 4 of 4 cycles Administration: 6 mg (08/15/2017), 6 mg (10/07/2017), 6 mg (10/31/2017) CARBOplatin (PARAPLATIN) 340 mg in sodium chloride 0.9 % 250 mL chemo infusion, 340 mg (100 % of original dose 340.5 mg), Intravenous,  Once, 4 of 4 cycles Dose modification:   (original dose 340.5 mg, Cycle 1),   (original dose 431 mg, Cycle  2) Administration: 340 mg (08/10/2017), 430 mg (09/07/2017), 430 mg (10/04/2017), 430 mg (10/26/2017) etoposide (VEPESID) 200 mg in sodium chloride 0.9 % 500 mL chemo infusion, 210 mg, Intravenous,  Once, 4 of 4 cycles Dose modification: 80 mg/m2 (original dose 100 mg/m2, Cycle 3, Reason: Provider Judgment) Administration: 200 mg (08/10/2017), 200 mg (08/11/2017), 200 mg (08/12/2017), 200 mg (09/07/2017), 200 mg (09/08/2017), 160 mg (10/04/2017), 160 mg (10/05/2017), 160 mg (10/06/2017), 160 mg (10/26/2017), 160 mg (10/27/2017), 160 mg (10/28/2017) fosaprepitant (EMEND) 150 mg, dexamethasone (DECADRON) 12 mg in sodium chloride 0.9 % 145 mL IVPB, , Intravenous,  Once, 4 of 4 cycles Administration:  (08/10/2017),  (09/07/2017),  (10/04/2017),  (10/26/2017) atezolizumab (TECENTRIQ) 1,200 mg in sodium chloride 0.9 % 250 mL chemo infusion, 1,200 mg, Intravenous, Once, 4 of 4 cycles Administration: 1,200 mg (08/10/2017), 1,200 mg (09/07/2017), 1,200 mg (10/04/2017), 1,200 mg (10/26/2017)  for chemotherapy treatment.        INTERVAL HISTORY:  Phillip Sanchez 84 y.o.  male pleasant patient above history of extensive stage small cell lung cancer currently on maintenance Tecentriq is here for follow-up.   Patient had episode of nausea vomiting after working outside on the lawn more.  Denies any headaches.  Proximate 2 weeks ago.  Mild fatigue.  Currently improving.  Appetite is good.  No diarrhea.  No skin rash.  Review of Systems  Constitutional: Positive for weight loss. Negative for chills, diaphoresis and fever.  HENT: Negative for nosebleeds and sore throat.   Eyes: Negative for double vision.  Respiratory: Negative for hemoptysis, sputum  production and wheezing.   Cardiovascular: Negative for chest pain, palpitations and orthopnea.  Gastrointestinal: Positive for nausea and vomiting. Negative for abdominal pain, blood in stool, constipation, diarrhea, heartburn and melena.  Genitourinary: Negative for dysuria,  frequency and urgency.  Musculoskeletal: Negative for back pain and joint pain.  Skin: Negative.  Negative for itching and rash.  Neurological: Negative for dizziness, tingling, focal weakness, weakness and headaches.  Endo/Heme/Allergies: Does not bruise/bleed easily.  Psychiatric/Behavioral: Negative for depression. The patient is not nervous/anxious and does not have insomnia.       PAST MEDICAL HISTORY :  Past Medical History:  Diagnosis Date  . Anemia   . CHF (congestive heart failure) ([place])   . Colon cancer [facility])    He states they removed cancerous polyps.  . Hypercholesteremia   . Hypertension   . Lung cancer ([place])   . Myocardial infarction ([place])   . Osteoarthritis   . Prostate cancer [facility])    Prostatectomy.  . Small cell lung cancer ([place]) 08/04/2017    PAST SURGICAL HISTORY :   Past Surgical History:  Procedure Laterality Date  . COLON SURGERY    . CORONARY ANGIOPLASTY WITH STENT PLACEMENT    . herniorrhaphy    . PORTA CATH INSERTION N/A 08/05/2017   Procedure: PORTA CATH INSERTION;  Surgeon: Katha Cabal, MD;  Location: [facility];  Service: Cardiovascular;  Laterality: N/A;    FAMILY HISTORY :   Family History  Problem Relation Age of Onset  . Cancer Brother 47       unknown orgin  . Cancer Sister 73       unknown type    SOCIAL HISTORY:   Social History   Tobacco Use  . Smoking status: Former Smoker    Packs/day: 1.00    Years: 36.00    Pack years: 36.00    Types: Cigarettes    Last attempt to quit: 1987    Years since quitting: 32.8  . Smokeless tobacco: Never Used  Substance Use Topics  . Alcohol use: No    Alcohol/week: 0.0 standard drinks  . Drug use: No    ALLERGIES:  has No Known Allergies.  MEDICATIONS:  Current Outpatient Medications  Medication Sig Dispense Refill  . amLODipine-benazepril (LOTREL) 10-40 MG capsule Take 1 capsule by mouth daily.    Marland Kitchen aspirin EC 81 MG tablet Take 81 mg by mouth daily.     Marland Kitchen  atorvastatin (LIPITOR) 20 MG tablet Take 20 mg by mouth at bedtime.     . calcium carbonate (OS-CAL - DOSED IN MG OF ELEMENTAL CALCIUM) 1250 (500 Ca) MG tablet Take 1 tablet by mouth daily.    . carvedilol (COREG) 25 MG tablet Take 25 mg by mouth 2 (two) times daily.     . cetirizine (ZYRTEC) 10 MG tablet Take 1 tablet by mouth daily.    . clopidogrel (PLAVIX) 75 MG tablet Take 75 mg by mouth daily.     Marland Kitchen docusate sodium (COLACE) 100 MG capsule Take 1 capsule (100 mg total) by mouth daily as needed. 30 capsule 2  . esomeprazole (NEXIUM) 20 MG capsule Take 1 capsule (20 mg total) by mouth daily. 90 capsule 1  . fexofenadine-pseudoephedrine (ALLEGRA-D 24) 180-240 MG per 24 hr tablet Take 1 tablet by mouth daily as needed (for allergies).     . fluticasone (FLONASE) 50 MCG/ACT nasal spray Place 1 spray into the nose daily as needed for allergies or rhinitis.     Marland Kitchen  Fluticasone-Salmeterol (ADVAIR DISKUS) 500-50 MCG/DOSE AEPB Inhale 1 puff into the lungs 2 (two) times daily. 1 each 3  . furosemide (LASIX) 20 MG tablet Take 1 tablet (20 mg total) by mouth 2 (two) times daily. (Patient taking differently: Take 20 mg by mouth daily. ) 60 tablet 3  . Multiple Vitamin (MULTI-VITAMINS) TABS Take 1 tablet by mouth daily.     . Oxycodone HCl 10 MG TABS Take 1 tablet (10 mg total) by mouth every 6 (six) hours as needed. 30 tablet 0  . polyethylene glycol (MIRALAX / GLYCOLAX) packet Take 17 g by mouth daily. 30 each 0  . potassium chloride 20 MEQ TBCR Take 20 mEq by mouth daily. 60 tablet 3  . prochlorperazine (COMPAZINE) 10 MG tablet Take 1 tablet (10 mg total) by mouth every 6 (six) hours as needed for nausea or vomiting. 40 tablet 1  . ranitidine (ZANTAC) 300 MG tablet Take 300 mg by mouth at bedtime.    Marland Kitchen rOPINIRole (REQUIP) 1 MG tablet Take 1 mg by mouth at bedtime.     . senna (SENOKOT) 8.6 MG TABS tablet Take 1 tablet (8.6 mg total) by mouth daily. 120 each 1  . temazepam (RESTORIL) 15 MG capsule Take  15-30 mg by mouth at bedtime as needed for sleep.      No current facility-administered medications for this visit.    Facility-Administered Medications Ordered in Other Visits  Medication Dose Route Frequency Provider Last Rate Last Dose  . atezolizumab (TECENTRIQ) 1,200 mg in sodium chloride 0.9 % 250 mL chemo infusion  1,200 mg Intravenous Once Cammie Sickle, MD 540 mL/hr at 12/14/17 1011 1,200 mg at 12/14/17 1011  . heparin lock flush 100 unit/mL  500 Units Intracatheter Once PRN Cammie Sickle, MD        PHYSICAL EXAMINATION: ECOG PERFORMANCE STATUS: 1 - Symptomatic but completely ambulatory  BP 138/67 (BP Location: Left Arm, Patient Position: Sitting)   Pulse 98   Temp (!) 97.1 F (36.2 C) (Tympanic)   Resp 16   Wt 173 lb 9.6 oz (78.7 kg)   BMI 24.91 kg/m   Filed Weights   12/14/17 0854  Weight: 173 lb 9.6 oz (78.7 kg)    Physical Exam  Constitutional: He is oriented to person, place, and time and well-developed, well-nourished, and in no distress.  He is walking by himself.Elderly male patient; accompanied by his son.   HENT:  Head: Normocephalic and atraumatic.  Mouth/Throat: Oropharynx is clear and moist. No oropharyngeal exudate.  Eyes: Pupils are equal, round, and reactive to light.  Neck: Normal range of motion. Neck supple.  Cardiovascular: Normal rate and regular rhythm.  Pulmonary/Chest: No respiratory distress. He has no wheezes.  Decreased breath sounds bilaterally.  Abdominal: Soft. Bowel sounds are normal. He exhibits no distension and no mass. There is no tenderness. There is no rebound and no guarding.  Musculoskeletal: Normal range of motion. He exhibits no edema or tenderness.  Neurological: He is alert and oriented to person, place, and time.  Skin: Skin is warm.  Psychiatric: Affect normal.       LABORATORY DATA:  I have reviewed the data as listed    Component Value Date/Time   NA 139 12/14/2017 0828   NA 143 01/24/2014  1057   K 3.7 12/14/2017 0828   K 3.9 01/24/2014 1057   CL 105 12/14/2017 0828   CL 106 01/24/2014 1057   CO2 27 12/14/2017 0828   CO2 27 01/24/2014 1057  GLUCOSE 138 (H) 12/14/2017 0828   GLUCOSE 105 (H) 01/24/2014 1057   BUN 20 12/14/2017 0828   BUN 24 (H) 01/24/2014 1057   CREATININE 1.08 12/14/2017 0828   CREATININE 1.19 01/24/2014 1057   CALCIUM 8.8 (L) 12/14/2017 0828   CALCIUM 8.9 01/24/2014 1057   PROT 6.4 (L) 12/14/2017 0828   PROT 6.8 01/24/2014 1057   ALBUMIN 3.4 (L) 12/14/2017 0828   ALBUMIN 3.5 01/24/2014 1057   AST 25 12/14/2017 0828   AST 19 01/24/2014 1057   ALT 14 12/14/2017 0828   ALT 25 01/24/2014 1057   ALKPHOS 54 12/14/2017 0828   ALKPHOS 73 01/24/2014 1057   BILITOT 0.6 12/14/2017 0828   BILITOT 0.6 01/24/2014 1057   GFRNONAA 59 (L) 12/14/2017 0828   GFRNONAA >60 01/24/2014 1057   GFRNONAA 50 (L) 10/21/2013 0447   GFRAA >60 12/14/2017 0828   GFRAA >60 01/24/2014 1057   GFRAA 58 (L) 10/21/2013 0447    No results found for: SPEP, UPEP  Lab Results  Component Value Date   WBC 5.1 12/14/2017   NEUTROABS 3.0 12/14/2017   HGB 10.6 (L) 12/14/2017   HCT 31.9 (L) 12/14/2017   MCV 105.3 (H) 12/14/2017   PLT 171 12/14/2017      Chemistry      Component Value Date/Time   NA 139 12/14/2017 0828   NA 143 01/24/2014 1057   K 3.7 12/14/2017 0828   K 3.9 01/24/2014 1057   CL 105 12/14/2017 0828   CL 106 01/24/2014 1057   CO2 27 12/14/2017 0828   CO2 27 01/24/2014 1057   BUN 20 12/14/2017 0828   BUN 24 (H) 01/24/2014 1057   CREATININE 1.08 12/14/2017 0828   CREATININE 1.19 01/24/2014 1057      Component Value Date/Time   CALCIUM 8.8 (L) 12/14/2017 0828   CALCIUM 8.9 01/24/2014 1057   ALKPHOS 54 12/14/2017 0828   ALKPHOS 73 01/24/2014 1057   AST 25 12/14/2017 0828   AST 19 01/24/2014 1057   ALT 14 12/14/2017 0828   ALT 25 01/24/2014 1057   BILITOT 0.6 12/14/2017 0828   BILITOT 0.6 01/24/2014 1057       RADIOGRAPHIC STUDIES: I have  personally reviewed the radiological images as listed and agreed with the findings in the report. No results found.   ASSESSMENT & PLAN:  [name] #Extensive stage small cell lung cancer- status post 4 cycles carbo-etop-tecentriq- CT chest- STABLE: one liver lesion bigger [21mm]. Over all STABLE.  Currently on Tecentriq maintenance.   # Continue Tecentriq for now. Labs today reviewed;  acceptable for treatment today.   # CHF s/p admission EF- 40-45%. continue lasix 20 BID/ K 20 . STABLE.   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 Ordered MRI brain today.   DISPOSITION:  # treatment today; #follow up in 3 weeks- MD/labs [cbc/cmp] Tecentriq; MRI brain Prior- Dr.B     Orders Placed This Encounter  Procedures  . MR Brain W Wo Contrast    Standing Status:   Future    Standing Expiration Date:   12/14/2018    Order Specific Question:   ** REASON FOR EXAM (FREE TEXT)    Answer:   brain mets; small cell lung cnacer    Order Specific Question:   If indicated for the ordered procedure, I authorize the administration of contrast media per Radiology protocol    Answer:  Yes    Order Specific Question:   What is the patient's sedation requirement?    Answer:   No Sedation    Order Specific Question:   Does the patient have a pacemaker or implanted devices?    Answer:   No    Order Specific Question:   Use SRS Protocol?    Answer:   Yes    Order Specific Question:   Radiology Contrast Protocol - do NOT remove file path    Answer:   [email or web address]    Order Specific Question:   Preferred imaging location?    Answer:   [facility] (table limit-300lbs)   All questions were answered. The patient knows to call the clinic with any problems, questions or concerns.      Cammie Sickle, MD 12/14/2017 10:13 AM

## 2017-12-14 NOTE — Addendum Note (Signed)
Addended by: [place]  on: 12/14/2017 11:53 AM   Modules accepted: Orders

## 2017-12-31 ENCOUNTER — Ambulatory Visit
Admission: RE | Admit: 2017-12-31 | Discharge: 2017-12-31 | Disposition: A | Discharge status: Home / Self Care | Payer: Medicare Other | Source: Ambulatory Visit | Attending: Internal Medicine | Admitting: Internal Medicine

## 2017-12-31 DIAGNOSIS — C3432 Malignant neoplasm of lower lobe, left bronchus or lung: Secondary | ICD-10-CM | POA: Diagnosis present

## 2017-12-31 DIAGNOSIS — C7931 Secondary malignant neoplasm of brain: Secondary | ICD-10-CM | POA: Insufficient documentation

## 2017-12-31 MED ORDER — GADOBUTROL 1 MMOL/ML IV SOLN
7.0000 mL | Freq: Once | INTRAVENOUS | Status: AC | PRN
  Administered 2017-12-31: 7 mL via INTRAVENOUS

## 2018-01-02 ENCOUNTER — Telehealth: Payer: Self-pay | Admitting: Internal Medicine

## 2018-01-02 NOTE — Telephone Encounter (Signed)
FYI- I Spoke to patient regarding the MRI; spoke to radiation oncology appointment set up on 11/20.  Patient asymptomatic.  Patient sees me the same day.

## 2018-01-03 ENCOUNTER — Other Ambulatory Visit: Payer: Self-pay | Admitting: *Deleted

## 2018-01-03 DIAGNOSIS — C3490 Malignant neoplasm of unspecified part of unspecified bronchus or lung: Secondary | ICD-10-CM

## 2018-01-04 ENCOUNTER — Ambulatory Visit
Admission: RE | Admit: 2018-01-04 | Discharge: 2018-01-04 | Disposition: A | Discharge status: Home / Self Care | Payer: Medicare Other | Source: Ambulatory Visit | Attending: Radiation Oncology | Admitting: Radiation Oncology

## 2018-01-04 ENCOUNTER — Inpatient Hospital Stay: Payer: Medicare Other | Attending: Internal Medicine

## 2018-01-04 ENCOUNTER — Inpatient Hospital Stay (HOSPITAL_BASED_OUTPATIENT_CLINIC_OR_DEPARTMENT_OTHER): Payer: Medicare Other | Admitting: Internal Medicine

## 2018-01-04 ENCOUNTER — Inpatient Hospital Stay: Payer: Medicare Other

## 2018-01-04 ENCOUNTER — Other Ambulatory Visit: Payer: Self-pay

## 2018-01-04 VITALS — BP 132/66 | HR 76 | Temp 98.2°F | Resp 18 | Ht 70.0 in | Wt 171.8 lb

## 2018-01-04 DIAGNOSIS — Z800 Family history of malignant neoplasm of digestive organs: Secondary | ICD-10-CM | POA: Diagnosis not present

## 2018-01-04 DIAGNOSIS — C3432 Malignant neoplasm of lower lobe, left bronchus or lung: Secondary | ICD-10-CM

## 2018-01-04 DIAGNOSIS — C7931 Secondary malignant neoplasm of brain: Secondary | ICD-10-CM | POA: Insufficient documentation

## 2018-01-04 DIAGNOSIS — I509 Heart failure, unspecified: Secondary | ICD-10-CM | POA: Diagnosis not present

## 2018-01-04 DIAGNOSIS — D649 Anemia, unspecified: Secondary | ICD-10-CM | POA: Diagnosis not present

## 2018-01-04 DIAGNOSIS — I10 Essential (primary) hypertension: Secondary | ICD-10-CM | POA: Diagnosis not present

## 2018-01-04 DIAGNOSIS — E7800 Pure hypercholesterolemia, unspecified: Secondary | ICD-10-CM | POA: Insufficient documentation

## 2018-01-04 DIAGNOSIS — Z8546 Personal history of malignant neoplasm of prostate: Secondary | ICD-10-CM | POA: Insufficient documentation

## 2018-01-04 DIAGNOSIS — Z9221 Personal history of antineoplastic chemotherapy: Secondary | ICD-10-CM | POA: Insufficient documentation

## 2018-01-04 DIAGNOSIS — Z7982 Long term (current) use of aspirin: Secondary | ICD-10-CM | POA: Diagnosis not present

## 2018-01-04 DIAGNOSIS — M1990 Unspecified osteoarthritis, unspecified site: Secondary | ICD-10-CM | POA: Insufficient documentation

## 2018-01-04 DIAGNOSIS — Z85038 Personal history of other malignant neoplasm of large intestine: Secondary | ICD-10-CM

## 2018-01-04 DIAGNOSIS — N390 Urinary tract infection, site not specified: Secondary | ICD-10-CM | POA: Insufficient documentation

## 2018-01-04 DIAGNOSIS — Z87891 Personal history of nicotine dependence: Secondary | ICD-10-CM | POA: Diagnosis not present

## 2018-01-04 DIAGNOSIS — C3490 Malignant neoplasm of unspecified part of unspecified bronchus or lung: Secondary | ICD-10-CM

## 2018-01-04 DIAGNOSIS — Z79899 Other long term (current) drug therapy: Secondary | ICD-10-CM | POA: Diagnosis not present

## 2018-01-04 DIAGNOSIS — Z905 Acquired absence of kidney: Secondary | ICD-10-CM | POA: Diagnosis not present

## 2018-01-04 DIAGNOSIS — I252 Old myocardial infarction: Secondary | ICD-10-CM | POA: Insufficient documentation

## 2018-01-04 LAB — COMPREHENSIVE METABOLIC PANEL WITH GFR
ALT: 18 U/L (ref 0–44)
AST: 34 U/L (ref 15–41)
Albumin: 3.2 g/dL — ABNORMAL LOW (ref 3.5–5.0)
Alkaline Phosphatase: 59 U/L (ref 38–126)
Anion gap: 8 (ref 5–15)
BUN: 26 mg/dL — ABNORMAL HIGH (ref 8–23)
CO2: 24 mmol/L (ref 22–32)
Calcium: 8.9 mg/dL (ref 8.9–10.3)
Chloride: 108 mmol/L (ref 98–111)
Creatinine, Ser: 1.24 mg/dL (ref 0.61–1.24)
GFR calc Af Amer: 58 mL/min — ABNORMAL LOW
GFR calc non Af Amer: 50 mL/min — ABNORMAL LOW
Glucose, Bld: 149 mg/dL — ABNORMAL HIGH (ref 70–99)
Potassium: 4 mmol/L (ref 3.5–5.1)
Sodium: 140 mmol/L (ref 135–145)
Total Bilirubin: 0.9 mg/dL (ref 0.3–1.2)
Total Protein: 6.7 g/dL (ref 6.5–8.1)

## 2018-01-04 LAB — CBC WITH DIFFERENTIAL/PLATELET
ABS IMMATURE GRANULOCYTES: 0.01 10*3/uL (ref 0.00–0.07)
BASOS ABS: 0.1 10*3/uL (ref 0.0–0.1)
Basophils Relative: 2 %
EOS ABS: 0.4 10*3/uL (ref 0.0–0.5)
EOS PCT: 7 %
HEMATOCRIT: 30.4 % — AB (ref 39.0–52.0)
HEMOGLOBIN: 9.8 g/dL — AB (ref 13.0–17.0)
IMMATURE GRANULOCYTES: 0 %
LYMPHS PCT: 11 %
Lymphs Abs: 0.7 10*3/uL (ref 0.7–4.0)
MCH: 34.1 pg — ABNORMAL HIGH (ref 26.0–34.0)
MCHC: 32.2 g/dL (ref 30.0–36.0)
MCV: 105.9 fL — ABNORMAL HIGH (ref 80.0–100.0)
Monocytes Absolute: 0.6 10*3/uL (ref 0.1–1.0)
Monocytes Relative: 10 %
NEUTROS PCT: 70 %
NRBC: 0 % (ref 0.0–0.2)
Neutro Abs: 4 10*3/uL (ref 1.7–7.7)
Platelets: 204 10*3/uL (ref 150–400)
RBC: 2.87 MIL/uL — AB (ref 4.22–5.81)
RDW: 13.3 % (ref 11.5–15.5)
WBC: 5.8 10*3/uL (ref 4.0–10.5)

## 2018-01-04 MED ORDER — SODIUM CHLORIDE 0.9% FLUSH
10.0000 mL | INTRAVENOUS | Status: DC | PRN
  Administered 2018-01-04: 10 mL via INTRAVENOUS
  Filled 2018-01-04: qty 10

## 2018-01-04 MED ORDER — HEPARIN SOD (PORK) LOCK FLUSH 100 UNIT/ML IV SOLN
500.0000 [IU] | Freq: Once | INTRAVENOUS | Status: AC
  Administered 2018-01-04: 500 [IU] via INTRAVENOUS

## 2018-01-04 NOTE — Assessment & Plan Note (Addendum)
#  Extensive stage small cell lung cancer- status post 4 cycles carbo-etop-tecentriq- CT chest- STABLE: one liver lesion bigger [88mm]. Over all STABLE.  Currently on Tecentriq maintenance.   # But HOLD tecentriq for now; given worsening brain mets [see discussion below].  MRI brain November 17-multiple brain lesions up to 20 largest being up to 1.2 cm in the right cerebellar area.  No obvious symptoms at this time.  #Had a long discussion with the patient/son regarding the overall poor prognosis given the multiple brain metastases.  However will have hold immunotherapy as patient will be getting radiation to brain.  # Brain MRI suggestive of asymptomatic subcentimeter brain mets- multilpe new lesions- largest 1.24mm; recommend whole brain radiation.  Has appointment with Dr. Donella Stade later in the day today.  # CHF s/p admission EF- 40-45%. continue lasix 20 BID/ K 20 .  Stable  # UTI- on levaquin;   #Overall prognosis poor-discussed regarding palliative care consultation/interested/next visit.  DISPOSITION:  # NO treatment today; de-access # follow up in 3 weeks-MDlabs- cbc/cmp # Palliative care referral to [facility] in 3 weeks--Dr.B  # 40 minutes face-to-face with the patient discussing the above plan of care; more than 50% of time spent on prognosis/ natural history; counseling and coordination.   # I reviewed the blood work- with the patient in detail; also reviewed the imaging independently [as summarized above]; and with the patient in detail.

## 2018-01-04 NOTE — Progress Notes (Signed)
[place] OFFICE PROGRESS NOTE  Patient Care Team: Idelle Crouch, MD as PCP - General (Internal Medicine) Telford Nab, RN as Registered Nurse Fath, Javier Docker, MD as Consulting Physician (Cardiology) Cammie Sickle, MD as Medical Oncologist (Medical Oncology)  Cancer Staging No matching staging information was found for the patient.   Oncology History   # 2015- LLL [place] ca stage I s/p SBRT [Dr.Crystal]  # 3rd June 2019- Multiple liver lesions- s/p Bx- SMALL CELL of lung origin; STAGE IV;  # June 26th- carbo-etop-atezo  # NOV 17th 2019-more than 20 brain lesions the largest 1.2 mm right cerebellum-recommend whole brain radiation  # Prostate cancer [ 24 years ago]; s/p surgery  # colon cancer [s/p resection of malignant polyps; Dr/Elliot; No colectomy]  # hx CAD [asprin/plavix]; hx PVD  -----------------------------------------------   DIAGNOSIS: Small cell lung cancer  STAGE:  IV   ;GOALS: Palliative  CURRENT/MOST RECENT THERAPY: carbo-etop-Atezo      Cancer of lower lobe of left lung ([place])   11/22/2017 -  Chemotherapy    The patient had atezolizumab (TECENTRIQ) 1,200 mg in sodium chloride 0.9 % 250 mL chemo infusion, 1,200 mg, Intravenous, Once, 2 of 6 cycles Administration: 1,200 mg (11/23/2017), 1,200 mg (12/14/2017)  for chemotherapy treatment.      Small cell lung cancer ([place])   08/04/2017 Initial Diagnosis    Small cell lung cancer ([place])    08/04/2017 - 11/17/2017 Chemotherapy    The patient had palonosetron (ALOXI) injection 0.25 mg, 0.25 mg, Intravenous,  Once, 4 of 4 cycles Administration: 0.25 mg (08/10/2017), 0.25 mg (09/07/2017), 0.25 mg (10/04/2017), 0.25 mg (10/26/2017) pegfilgrastim-cbqv (UDENYCA) injection 6 mg, 6 mg, Subcutaneous, Once, 4 of 4 cycles Administration: 6 mg (08/15/2017), 6 mg (10/07/2017), 6 mg (10/31/2017) CARBOplatin (PARAPLATIN) 340 mg in sodium chloride 0.9 % 250 mL chemo infusion, 340 mg (100 % of original dose  340.5 mg), Intravenous,  Once, 4 of 4 cycles Dose modification:   (original dose 340.5 mg, Cycle 1),   (original dose 431 mg, Cycle 2) Administration: 340 mg (08/10/2017), 430 mg (09/07/2017), 430 mg (10/04/2017), 430 mg (10/26/2017) etoposide (VEPESID) 200 mg in sodium chloride 0.9 % 500 mL chemo infusion, 210 mg, Intravenous,  Once, 4 of 4 cycles Dose modification: 80 mg/m2 (original dose 100 mg/m2, Cycle 3, Reason: Provider Judgment) Administration: 200 mg (08/10/2017), 200 mg (08/11/2017), 200 mg (08/12/2017), 200 mg (09/07/2017), 200 mg (09/08/2017), 160 mg (10/04/2017), 160 mg (10/05/2017), 160 mg (10/06/2017), 160 mg (10/26/2017), 160 mg (10/27/2017), 160 mg (10/28/2017) fosaprepitant (EMEND) 150 mg, dexamethasone (DECADRON) 12 mg in sodium chloride 0.9 % 145 mL IVPB, , Intravenous,  Once, 4 of 4 cycles Administration:  (08/10/2017),  (09/07/2017),  (10/04/2017),  (10/26/2017) atezolizumab (TECENTRIQ) 1,200 mg in sodium chloride 0.9 % 250 mL chemo infusion, 1,200 mg, Intravenous, Once, 4 of 4 cycles Administration: 1,200 mg (08/10/2017), 1,200 mg (09/07/2017), 1,200 mg (10/04/2017), 1,200 mg (10/26/2017)  for chemotherapy treatment.        INTERVAL HISTORY:  DANTHONY KENDRIX 84 y.o.  male pleasant patient above history of extensive stage small cell lung cancer currently on maintenance Tecentriq is here for follow-up.   Patient interim had a MRI of the brain.  Patient continues to have worsening fatigue.  Denies any worsening shortness of breath or cough.   Appetite is fair.  No skin rash.  No diarrhea   Review of Systems  Constitutional: Positive for weight loss. Negative for chills, diaphoresis and fever.  HENT:  Negative for nosebleeds and sore throat.   Eyes: Negative for double vision.  Respiratory: Negative for hemoptysis, sputum production and wheezing.   Cardiovascular: Negative for chest pain, palpitations and orthopnea.  Gastrointestinal: Positive for nausea and vomiting. Negative for abdominal  pain, blood in stool, constipation, diarrhea, heartburn and melena.  Genitourinary: Negative for dysuria, frequency and urgency.  Musculoskeletal: Negative for back pain and joint pain.  Skin: Negative.  Negative for itching and rash.  Neurological: Negative for dizziness, tingling, focal weakness, weakness and headaches.  Endo/Heme/Allergies: Does not bruise/bleed easily.  Psychiatric/Behavioral: Negative for depression. The patient is not nervous/anxious and does not have insomnia.       PAST MEDICAL HISTORY :  Past Medical History:  Diagnosis Date  . Anemia   . CHF (congestive heart failure) ([place])   . Colon cancer [facility])    He states they removed cancerous polyps.  . Hypercholesteremia   . Hypertension   . Lung cancer ([place])   . Myocardial infarction ([place])   . Osteoarthritis   . Prostate cancer [facility])    Prostatectomy.  . Small cell lung cancer ([place]) 08/04/2017    PAST SURGICAL HISTORY :   Past Surgical History:  Procedure Laterality Date  . COLON SURGERY    . CORONARY ANGIOPLASTY WITH STENT PLACEMENT    . herniorrhaphy    . PORTA CATH INSERTION N/A 08/05/2017   Procedure: PORTA CATH INSERTION;  Surgeon: Katha Cabal, MD;  Location: [place] [facility];  Service: Cardiovascular;  Laterality: N/A;    FAMILY HISTORY :   Family History  Problem Relation Age of Onset  . Cancer Brother 78       unknown orgin  . Cancer Sister 3       unknown type    SOCIAL HISTORY:   Social History   Tobacco Use  . Smoking status: Former Smoker    Packs/day: 1.00    Years: 36.00    Pack years: 36.00    Types: Cigarettes    Last attempt to quit: 1987    Years since quitting: 32.9  . Smokeless tobacco: Never Used  Substance Use Topics  . Alcohol use: No    Alcohol/week: 0.0 standard drinks  . Drug use: No    ALLERGIES:  has No Known Allergies.  MEDICATIONS:  Current Outpatient Medications  Medication Sig Dispense Refill  . amLODipine-benazepril (LOTREL) 10-40 MG  capsule Take 1 capsule by mouth daily.    Marland Kitchen aspirin EC 81 MG tablet Take 81 mg by mouth daily.     Marland Kitchen atorvastatin (LIPITOR) 20 MG tablet Take 20 mg by mouth at bedtime.     . carvedilol (COREG) 25 MG tablet Take 25 mg by mouth 2 (two) times daily.     . clopidogrel (PLAVIX) 75 MG tablet Take 75 mg by mouth daily.     Marland Kitchen esomeprazole (NEXIUM) 20 MG capsule Take 1 capsule (20 mg total) by mouth daily. 90 capsule 1  . furosemide (LASIX) 20 MG tablet Take 1 tablet (20 mg total) by mouth 2 (two) times daily. (Patient taking differently: Take 20 mg by mouth daily. ) 60 tablet 3  . levofloxacin (LEVAQUIN) 250 MG tablet Take 1 tablet by mouth daily.    . polyethylene glycol (MIRALAX / GLYCOLAX) packet Take 17 g by mouth daily. 30 each 0  . potassium chloride 20 MEQ TBCR Take 20 mEq by mouth daily. 60 tablet 3  . ranitidine (ZANTAC) 300 MG tablet Take 300 mg by mouth  at bedtime.    . temazepam (RESTORIL) 15 MG capsule Take 15-30 mg by mouth at bedtime as needed for sleep.     . calcium carbonate (OS-CAL - DOSED IN MG OF ELEMENTAL CALCIUM) 1250 (500 Ca) MG tablet Take 1 tablet by mouth daily.    . cetirizine (ZYRTEC) 10 MG tablet Take 1 tablet by mouth daily.    Marland Kitchen docusate sodium (COLACE) 100 MG capsule Take 1 capsule (100 mg total) by mouth daily as needed. (Patient not taking: Reported on 01/04/2018) 30 capsule 2  . fexofenadine-pseudoephedrine (ALLEGRA-D 24) 180-240 MG per 24 hr tablet Take 1 tablet by mouth daily as needed (for allergies).     . fluticasone (FLONASE) 50 MCG/ACT nasal spray Place 1 spray into the nose daily as needed for allergies or rhinitis.     . Fluticasone-Salmeterol (ADVAIR DISKUS) 500-50 MCG/DOSE AEPB Inhale 1 puff into the lungs 2 (two) times daily. (Patient not taking: Reported on 01/04/2018) 1 each 3  . Multiple Vitamin (MULTI-VITAMINS) TABS Take 1 tablet by mouth daily.     . Oxycodone HCl 10 MG TABS Take 1 tablet (10 mg total) by mouth every 6 (six) hours as needed. (Patient  not taking: Reported on 01/04/2018) 30 tablet 0  . prochlorperazine (COMPAZINE) 10 MG tablet Take 1 tablet (10 mg total) by mouth every 6 (six) hours as needed for nausea or vomiting. (Patient not taking: Reported on 01/04/2018) 40 tablet 1  . rOPINIRole (REQUIP) 1 MG tablet Take 1 mg by mouth at bedtime.     . senna (SENOKOT) 8.6 MG TABS tablet Take 1 tablet (8.6 mg total) by mouth daily. (Patient not taking: Reported on 01/04/2018) 120 each 1   No current facility-administered medications for this visit.    Facility-Administered Medications Ordered in Other Visits  Medication Dose Route Frequency Provider Last Rate Last Dose  . sodium chloride flush (NS) 0.9 % injection 10 mL  10 mL Intravenous PRN Cammie Sickle, MD   10 mL at 01/04/18 0909    PHYSICAL EXAMINATION: ECOG PERFORMANCE STATUS: 1 - Symptomatic but completely ambulatory  BP 132/66   Pulse 76   Temp 98.2 F (36.8 C) (Oral)   Resp 18   Ht 5\' 10"  (1.778 m)   Wt 171 lb 12.8 oz (77.9 kg)   BMI 24.65 kg/m   Filed Weights   01/04/18 0924  Weight: 171 lb 12.8 oz (77.9 kg)    Physical Exam  Constitutional: He is oriented to person, place, and time and well-developed, well-nourished, and in no distress.  He is walking by himself.Elderly male patient; accompanied by his son.   HENT:  Head: Normocephalic and atraumatic.  Mouth/Throat: Oropharynx is clear and moist. No oropharyngeal exudate.  Eyes: Pupils are equal, round, and reactive to light.  Neck: Normal range of motion. Neck supple.  Cardiovascular: Normal rate and regular rhythm.  Pulmonary/Chest: No respiratory distress. He has no wheezes.  Decreased breath sounds bilaterally.  Abdominal: Soft. Bowel sounds are normal. He exhibits no distension and no mass. There is no tenderness. There is no rebound and no guarding.  Musculoskeletal: Normal range of motion. He exhibits no edema or tenderness.  Neurological: He is alert and oriented to person, place, and  time.  Skin: Skin is warm.  Psychiatric: Affect normal.       LABORATORY DATA:  I have reviewed the data as listed    Component Value Date/Time   NA 140 01/04/2018 0900   NA 143 01/24/2014 1057  K 4.0 01/04/2018 0900   K 3.9 01/24/2014 1057   CL 108 01/04/2018 0900   CL 106 01/24/2014 1057   CO2 24 01/04/2018 0900   CO2 27 01/24/2014 1057   GLUCOSE 149 (H) 01/04/2018 0900   GLUCOSE 105 (H) 01/24/2014 1057   BUN 26 (H) 01/04/2018 0900   BUN 24 (H) 01/24/2014 1057   CREATININE 1.24 01/04/2018 0900   CREATININE 1.19 01/24/2014 1057   CALCIUM 8.9 01/04/2018 0900   CALCIUM 8.9 01/24/2014 1057   PROT 6.7 01/04/2018 0900   PROT 6.8 01/24/2014 1057   ALBUMIN 3.2 (L) 01/04/2018 0900   ALBUMIN 3.5 01/24/2014 1057   AST 34 01/04/2018 0900   AST 19 01/24/2014 1057   ALT 18 01/04/2018 0900   ALT 25 01/24/2014 1057   ALKPHOS 59 01/04/2018 0900   ALKPHOS 73 01/24/2014 1057   BILITOT 0.9 01/04/2018 0900   BILITOT 0.6 01/24/2014 1057   GFRNONAA 50 (L) 01/04/2018 0900   GFRNONAA >60 01/24/2014 1057   GFRNONAA 50 (L) 10/21/2013 0447   GFRAA 58 (L) 01/04/2018 0900   GFRAA >60 01/24/2014 1057   GFRAA 58 (L) 10/21/2013 0447    No results found for: SPEP, UPEP  Lab Results  Component Value Date   WBC 5.8 01/04/2018   NEUTROABS 4.0 01/04/2018   HGB 9.8 (L) 01/04/2018   HCT 30.4 (L) 01/04/2018   MCV 105.9 (H) 01/04/2018   PLT 204 01/04/2018      Chemistry      Component Value Date/Time   NA 140 01/04/2018 0900   NA 143 01/24/2014 1057   K 4.0 01/04/2018 0900   K 3.9 01/24/2014 1057   CL 108 01/04/2018 0900   CL 106 01/24/2014 1057   CO2 24 01/04/2018 0900   CO2 27 01/24/2014 1057   BUN 26 (H) 01/04/2018 0900   BUN 24 (H) 01/24/2014 1057   CREATININE 1.24 01/04/2018 0900   CREATININE 1.19 01/24/2014 1057      Component Value Date/Time   CALCIUM 8.9 01/04/2018 0900   CALCIUM 8.9 01/24/2014 1057   ALKPHOS 59 01/04/2018 0900   ALKPHOS 73 01/24/2014 1057   AST 34  01/04/2018 0900   AST 19 01/24/2014 1057   ALT 18 01/04/2018 0900   ALT 25 01/24/2014 1057   BILITOT 0.9 01/04/2018 0900   BILITOT 0.6 01/24/2014 1057       RADIOGRAPHIC STUDIES: I have personally reviewed the radiological images as listed and agreed with the findings in the report. No results found.   ASSESSMENT & PLAN:  [name] #Extensive stage small cell lung cancer- status post 4 cycles carbo-etop-tecentriq- CT chest- STABLE: one liver lesion bigger [52mm]. Over all STABLE.  Currently on Tecentriq maintenance.   # But HOLD tecentriq for now; given worsening brain mets [see discussion below].  MRI brain November 17-multiple brain lesions up to 20 largest being up to 1.2 cm in the right cerebellar area.  No obvious symptoms at this time.  #Had a long discussion with the patient/son regarding the overall poor prognosis given the multiple brain metastases.  However will have hold immunotherapy as patient will be getting radiation to brain.  # Brain MRI suggestive of asymptomatic subcentimeter brain mets- multilpe new lesions- largest 1.31mm; recommend whole brain radiation.  Has appointment with Dr. Donella Stade later in the day today.  # CHF s/p admission EF- 40-45%. continue lasix 20 BID/ K 20 .  Stable  # UTI- on levaquin;   #  Overall prognosis poor-discussed regarding palliative care consultation/interested/next visit.  DISPOSITION:  # NO treatment today; de-access # follow up in 3 weeks-MDlabs- cbc/cmp # Palliative care referral to [facility] in 3 weeks--Dr.B  # 40 minutes face-to-face with the patient discussing the above plan of care; more than 50% of time spent on prognosis/ natural history; counseling and coordination.   # I reviewed the blood work- with the patient in detail; also reviewed the imaging independently [as summarized above]; and with the patient in detail.     No orders of the defined types were placed in this encounter.  All  questions were answered. The patient knows to call the clinic with any problems, questions or concerns.      Cammie Sickle, MD 01/04/2018 10:52 AM

## 2018-01-04 NOTE — Consult Note (Signed)
NEW PATIENT EVALUATION  Name: Phillip Sanchez  MRN: [id number]  Date:   01/04/2018     DOB: 1927/09/29   This 84 y.o. male patient presents to the clinic for initial evaluation of brain metastasis secondary to extensive stage small cell lung cancer.  REFERRING PHYSICIAN: Idelle Crouch, MD  CHIEF COMPLAINT:  Chief Complaint  Patient presents with  . Cancer    Pt is here for initial consult of brain met    DIAGNOSIS: The primary encounter diagnosis was Malignant neoplasm of lower lobe of left lung ([place]). A diagnosis of Brain metastases [facility]) was also pertinent to this visit.   PREVIOUS INVESTIGATIONS:  MRI brain reviewed Clinical notes reviewed Previous pathology reviewed  HPI: patient is a 84 year old male well known to our department having been treated back in 2015 with SB RT to his left lower lobe for stage I adenocarcinoma. He presented in 2019 in June with multiple liver lesions status post biopsy showing small cell lung cancer stage IV. He was treated with chemotherapy starting in June 2019. Recent MRI of his brain on November 17 showed more than 20 brain lesions largest of which measures 1.2 cm in the right cerebellum. He is doing fairly well.he has been treated with palliative chemotherapy with carbo-etop-tecentriqwhich his on hold at this point. Patient does have congestive heart failure has an ejection fraction 40-45%. He specifically denies any focal neurologic deficits. He specifically denies any change in fields is a little lightheadedness having some balance issues is using a walker occasionally.  PLANNED TREATMENT REGIMEN: whole brain radiation  PAST MEDICAL HISTORY:  has a past medical history of Anemia, CHF (congestive heart failure) ([place]), Colon cancer ([place]), Hypercholesteremia, Hypertension, Lung cancer ([place]), Myocardial infarction ([place]), Osteoarthritis, Prostate cancer ([place]), and Small cell lung cancer ([place]) (08/04/2017).    PAST SURGICAL HISTORY:  Past Surgical  History:  Procedure Laterality Date  . COLON SURGERY    . CORONARY ANGIOPLASTY WITH STENT PLACEMENT    . herniorrhaphy    . PORTA CATH INSERTION N/A 08/05/2017   Procedure: PORTA CATH INSERTION;  Surgeon: Katha Cabal, MD;  Location: [place] [facility];  Service: Cardiovascular;  Laterality: N/A;    FAMILY HISTORY: family history includes Cancer (age of onset: 96) in his brother and sister.  SOCIAL HISTORY:  reports that he quit smoking about 32 years ago. His smoking use included cigarettes. He has a 36.00 pack-year smoking history. He has never used smokeless tobacco. He reports that he does not drink alcohol or use drugs.  ALLERGIES: Patient has no known allergies.  MEDICATIONS:  Current Outpatient Medications  Medication Sig Dispense Refill  . amLODipine-benazepril (LOTREL) 10-40 MG capsule Take 1 capsule by mouth daily.    Marland Kitchen aspirin EC 81 MG tablet Take 81 mg by mouth daily.     Marland Kitchen atorvastatin (LIPITOR) 20 MG tablet Take 20 mg by mouth at bedtime.     . calcium carbonate (OS-CAL - DOSED IN MG OF ELEMENTAL CALCIUM) 1250 (500 Ca) MG tablet Take 1 tablet by mouth daily.    . carvedilol (COREG) 25 MG tablet Take 25 mg by mouth 2 (two) times daily.     . cetirizine (ZYRTEC) 10 MG tablet Take 1 tablet by mouth daily.    . clopidogrel (PLAVIX) 75 MG tablet Take 75 mg by mouth daily.     Marland Kitchen docusate sodium (COLACE) 100 MG capsule Take 1 capsule (100 mg total) by mouth daily as needed. (Patient not taking: Reported on  01/04/2018) 30 capsule 2  . esomeprazole (NEXIUM) 20 MG capsule Take 1 capsule (20 mg total) by mouth daily. 90 capsule 1  . fexofenadine-pseudoephedrine (ALLEGRA-D 24) 180-240 MG per 24 hr tablet Take 1 tablet by mouth daily as needed (for allergies).     . fluticasone (FLONASE) 50 MCG/ACT nasal spray Place 1 spray into the nose daily as needed for allergies or rhinitis.     . Fluticasone-Salmeterol (ADVAIR DISKUS) 500-50 MCG/DOSE AEPB Inhale 1 puff into the lungs 2  (two) times daily. (Patient not taking: Reported on 01/04/2018) 1 each 3  . furosemide (LASIX) 20 MG tablet Take 1 tablet (20 mg total) by mouth 2 (two) times daily. (Patient taking differently: Take 20 mg by mouth daily. ) 60 tablet 3  . levofloxacin (LEVAQUIN) 250 MG tablet Take 1 tablet by mouth daily.    . Multiple Vitamin (MULTI-VITAMINS) TABS Take 1 tablet by mouth daily.     . Oxycodone HCl 10 MG TABS Take 1 tablet (10 mg total) by mouth every 6 (six) hours as needed. (Patient not taking: Reported on 01/04/2018) 30 tablet 0  . polyethylene glycol (MIRALAX / GLYCOLAX) packet Take 17 g by mouth daily. 30 each 0  . potassium chloride 20 MEQ TBCR Take 20 mEq by mouth daily. 60 tablet 3  . prochlorperazine (COMPAZINE) 10 MG tablet Take 1 tablet (10 mg total) by mouth every 6 (six) hours as needed for nausea or vomiting. (Patient not taking: Reported on 01/04/2018) 40 tablet 1  . ranitidine (ZANTAC) 300 MG tablet Take 300 mg by mouth at bedtime.    Marland Kitchen rOPINIRole (REQUIP) 1 MG tablet Take 1 mg by mouth at bedtime.     . senna (SENOKOT) 8.6 MG TABS tablet Take 1 tablet (8.6 mg total) by mouth daily. (Patient not taking: Reported on 01/04/2018) 120 each 1  . temazepam (RESTORIL) 15 MG capsule Take 15-30 mg by mouth at bedtime as needed for sleep.      No current facility-administered medications for this encounter.    Facility-Administered Medications Ordered in Other Encounters  Medication Dose Route Frequency Provider Last Rate Last Dose  . sodium chloride flush (NS) 0.9 % injection 10 mL  10 mL Intravenous PRN Cammie Sickle, MD   10 mL at 01/04/18 0909    ECOG PERFORMANCE STATUS:  1 - Symptomatic but completely ambulatory  REVIEW OF SYSTEMS:  Patient denies any weight loss, fatigue, weakness, fever, chills or night sweats. Patient denies any loss of vision, blurred vision. Patient denies any ringing  of the ears or hearing loss. No irregular heartbeat. Patient denies heart murmur or  history of fainting. Patient denies any chest pain or pain radiating to her upper extremities. Patient denies any shortness of breath, difficulty breathing at night, cough or hemoptysis. Patient denies any swelling in the lower legs. Patient denies any nausea vomiting, vomiting of blood, or coffee ground material in the vomitus. Patient denies any stomach pain. Patient states has had normal bowel movements no significant constipation or diarrhea. Patient denies any dysuria, hematuria or significant nocturia. Patient denies any problems walking, swelling in the joints or loss of balance. Patient denies any skin changes, loss of hair or loss of weight. Patient denies any excessive worrying or anxiety or significant depression. Patient denies any problems with insomnia. Patient denies excessive thirst, polyuria, polydipsia. Patient denies any swollen glands, patient denies easy bruising or easy bleeding. Patient denies any recent infections, allergies or URI. Patient "s visual fields have not  changed significantly in recent time.    PHYSICAL EXAM: There were no vitals taken for this visit. Well-developed well-nourished patient in NAD. HEENT reveals PERLA, EOMI, discs not visualized.  Oral cavity is clear. No oral mucosal lesions are identified. Neck is clear without evidence of cervical or supraclavicular adenopathy. Lungs are clear to A&P. Cardiac examination is essentially unremarkable with regular rate and rhythm without murmur rub or thrill. Abdomen is benign with no organomegaly or masses noted. Motor sensory and DTR levels are equal and symmetric in the upper and lower extremities. Cranial nerves II through XII are grossly intact. Proprioception is intact. No peripheral adenopathy or edema is identified. No motor or sensory levels are noted. Crude visual fields are within normal range.  LABORATORY DATA: previous pathology reviewed    RADIOLOGY RESULTS:mRI scan of brain reviewed compatible with the  above-stated findings   IMPRESSION: widespread brain metastasis from known small cell lung cancer in 84 year old male  PLAN: at the present time I have recommended whole brain radiation. Would plan on delivering 3000 cGy in 12 fractions. Risks and benefits of treatment including cognitive decline hair loss fatigue alteration of blood counts and skin reaction all were described in detail to the patient. He seems to comprehend my treatment plan well. I personally set up and ordered CT simulation this week. There will be extra effort by both professional staff as well as technical staff to coordinate and manage concurrent chemoradiation and ensuing side effects during his treatments.patient comprehend my treatment plan well.  I would like to take this opportunity to thank you for allowing me to participate in the care of your patient.Noreene Filbert, MD

## 2018-01-05 ENCOUNTER — Ambulatory Visit
Admission: RE | Admit: 2018-01-05 | Discharge: 2018-01-05 | Disposition: A | Discharge status: Home / Self Care | Payer: Medicare Other | Source: Ambulatory Visit | Attending: Radiation Oncology | Admitting: Radiation Oncology

## 2018-01-05 DIAGNOSIS — Z85118 Personal history of other malignant neoplasm of bronchus and lung: Secondary | ICD-10-CM | POA: Insufficient documentation

## 2018-01-05 DIAGNOSIS — Z87891 Personal history of nicotine dependence: Secondary | ICD-10-CM | POA: Diagnosis not present

## 2018-01-05 DIAGNOSIS — C7931 Secondary malignant neoplasm of brain: Secondary | ICD-10-CM | POA: Diagnosis present

## 2018-01-09 ENCOUNTER — Other Ambulatory Visit: Payer: Self-pay | Admitting: *Deleted

## 2018-01-16 ENCOUNTER — Ambulatory Visit
Admission: RE | Admit: 2018-01-16 | Discharge: 2018-01-16 | Disposition: A | Discharge status: Home / Self Care | Payer: Medicare Other | Source: Ambulatory Visit | Attending: Radiation Oncology | Admitting: Radiation Oncology

## 2018-01-16 DIAGNOSIS — C7931 Secondary malignant neoplasm of brain: Secondary | ICD-10-CM | POA: Diagnosis not present

## 2018-01-16 DIAGNOSIS — Z85118 Personal history of other malignant neoplasm of bronchus and lung: Secondary | ICD-10-CM | POA: Insufficient documentation

## 2018-01-16 DIAGNOSIS — Z87891 Personal history of nicotine dependence: Secondary | ICD-10-CM | POA: Insufficient documentation

## 2018-01-17 ENCOUNTER — Other Ambulatory Visit: Payer: Self-pay | Admitting: *Deleted

## 2018-01-17 ENCOUNTER — Ambulatory Visit
Admission: RE | Admit: 2018-01-17 | Discharge: 2018-01-17 | Disposition: A | Discharge status: Home / Self Care | Payer: Medicare Other | Source: Ambulatory Visit | Attending: Radiation Oncology | Admitting: Radiation Oncology

## 2018-01-17 DIAGNOSIS — C7931 Secondary malignant neoplasm of brain: Secondary | ICD-10-CM | POA: Diagnosis not present

## 2018-01-17 MED ORDER — DEXAMETHASONE 4 MG PO TABS: 4.0000 mg | ORAL_TABLET | Freq: Every day | ORAL | 0 refills | Status: DC

## 2018-01-18 ENCOUNTER — Ambulatory Visit
Admission: RE | Admit: 2018-01-18 | Discharge: 2018-01-18 | Disposition: A | Discharge status: Home / Self Care | Payer: Medicare Other | Source: Ambulatory Visit | Attending: Radiation Oncology | Admitting: Radiation Oncology

## 2018-01-18 DIAGNOSIS — C7931 Secondary malignant neoplasm of brain: Secondary | ICD-10-CM | POA: Diagnosis not present

## 2018-01-19 ENCOUNTER — Ambulatory Visit
Admission: RE | Admit: 2018-01-19 | Discharge: 2018-01-19 | Disposition: A | Discharge status: Home / Self Care | Payer: Medicare Other | Source: Ambulatory Visit | Attending: Radiation Oncology | Admitting: Radiation Oncology

## 2018-01-19 DIAGNOSIS — C7931 Secondary malignant neoplasm of brain: Secondary | ICD-10-CM | POA: Diagnosis not present

## 2018-01-20 ENCOUNTER — Ambulatory Visit
Admission: RE | Admit: 2018-01-20 | Discharge: 2018-01-20 | Disposition: A | Discharge status: Home / Self Care | Payer: Medicare Other | Source: Ambulatory Visit | Attending: Radiation Oncology | Admitting: Radiation Oncology

## 2018-01-20 ENCOUNTER — Other Ambulatory Visit: Payer: Self-pay

## 2018-01-20 DIAGNOSIS — C7931 Secondary malignant neoplasm of brain: Secondary | ICD-10-CM | POA: Diagnosis not present

## 2018-01-20 DIAGNOSIS — C3490 Malignant neoplasm of unspecified part of unspecified bronchus or lung: Secondary | ICD-10-CM

## 2018-01-23 ENCOUNTER — Ambulatory Visit
Admission: RE | Admit: 2018-01-23 | Discharge: 2018-01-23 | Disposition: A | Discharge status: Home / Self Care | Payer: Medicare Other | Source: Ambulatory Visit | Attending: Radiation Oncology | Admitting: Radiation Oncology

## 2018-01-23 DIAGNOSIS — C7931 Secondary malignant neoplasm of brain: Secondary | ICD-10-CM | POA: Diagnosis not present

## 2018-01-24 ENCOUNTER — Ambulatory Visit
Admission: RE | Admit: 2018-01-24 | Discharge: 2018-01-24 | Disposition: A | Discharge status: Home / Self Care | Payer: Medicare Other | Source: Ambulatory Visit | Attending: Radiation Oncology | Admitting: Radiation Oncology

## 2018-01-24 DIAGNOSIS — C7931 Secondary malignant neoplasm of brain: Secondary | ICD-10-CM | POA: Diagnosis not present

## 2018-01-25 ENCOUNTER — Other Ambulatory Visit: Payer: Self-pay

## 2018-01-25 ENCOUNTER — Inpatient Hospital Stay: Payer: Medicare Other | Attending: Internal Medicine

## 2018-01-25 ENCOUNTER — Inpatient Hospital Stay (HOSPITAL_BASED_OUTPATIENT_CLINIC_OR_DEPARTMENT_OTHER): Payer: Medicare Other | Admitting: Internal Medicine

## 2018-01-25 ENCOUNTER — Ambulatory Visit: Payer: Medicare Other | Admitting: Radiation Oncology

## 2018-01-25 ENCOUNTER — Ambulatory Visit
Admission: RE | Admit: 2018-01-25 | Discharge: 2018-01-25 | Disposition: A | Discharge status: Home / Self Care | Payer: Medicare Other | Source: Ambulatory Visit | Attending: Radiation Oncology | Admitting: Radiation Oncology

## 2018-01-25 ENCOUNTER — Inpatient Hospital Stay (HOSPITAL_BASED_OUTPATIENT_CLINIC_OR_DEPARTMENT_OTHER): Payer: Medicare Other | Admitting: Hospice and Palliative Medicine

## 2018-01-25 VITALS — BP 98/50 | HR 74 | Temp 96.1°F | Resp 16

## 2018-01-25 DIAGNOSIS — C3490 Malignant neoplasm of unspecified part of unspecified bronchus or lung: Secondary | ICD-10-CM

## 2018-01-25 DIAGNOSIS — Z7982 Long term (current) use of aspirin: Secondary | ICD-10-CM | POA: Insufficient documentation

## 2018-01-25 DIAGNOSIS — Z515 Encounter for palliative care: Secondary | ICD-10-CM | POA: Insufficient documentation

## 2018-01-25 DIAGNOSIS — Z7189 Other specified counseling: Secondary | ICD-10-CM

## 2018-01-25 DIAGNOSIS — Z8546 Personal history of malignant neoplasm of prostate: Secondary | ICD-10-CM

## 2018-01-25 DIAGNOSIS — Z85038 Personal history of other malignant neoplasm of large intestine: Secondary | ICD-10-CM | POA: Insufficient documentation

## 2018-01-25 DIAGNOSIS — C3432 Malignant neoplasm of lower lobe, left bronchus or lung: Secondary | ICD-10-CM

## 2018-01-25 DIAGNOSIS — I10 Essential (primary) hypertension: Secondary | ICD-10-CM | POA: Insufficient documentation

## 2018-01-25 DIAGNOSIS — Z79899 Other long term (current) drug therapy: Secondary | ICD-10-CM | POA: Insufficient documentation

## 2018-01-25 DIAGNOSIS — Z923 Personal history of irradiation: Secondary | ICD-10-CM | POA: Insufficient documentation

## 2018-01-25 DIAGNOSIS — C787 Secondary malignant neoplasm of liver and intrahepatic bile duct: Secondary | ICD-10-CM

## 2018-01-25 DIAGNOSIS — I252 Old myocardial infarction: Secondary | ICD-10-CM

## 2018-01-25 DIAGNOSIS — Z9221 Personal history of antineoplastic chemotherapy: Secondary | ICD-10-CM

## 2018-01-25 DIAGNOSIS — C7931 Secondary malignant neoplasm of brain: Secondary | ICD-10-CM | POA: Insufficient documentation

## 2018-01-25 DIAGNOSIS — Z7902 Long term (current) use of antithrombotics/antiplatelets: Secondary | ICD-10-CM

## 2018-01-25 LAB — CBC WITH DIFFERENTIAL/PLATELET
ABS IMMATURE GRANULOCYTES: 0.05 10*3/uL (ref 0.00–0.07)
BASOS PCT: 1 %
Basophils Absolute: 0.1 10*3/uL (ref 0.0–0.1)
Eosinophils Absolute: 0.7 10*3/uL — ABNORMAL HIGH (ref 0.0–0.5)
Eosinophils Relative: 7 %
HCT: 26.6 % — ABNORMAL LOW (ref 39.0–52.0)
HEMOGLOBIN: 8.5 g/dL — AB (ref 13.0–17.0)
Immature Granulocytes: 1 %
Lymphocytes Relative: 8 %
Lymphs Abs: 0.7 10*3/uL (ref 0.7–4.0)
MCH: 33.9 pg (ref 26.0–34.0)
MCHC: 32 g/dL (ref 30.0–36.0)
MCV: 106 fL — ABNORMAL HIGH (ref 80.0–100.0)
MONOS PCT: 11 %
Monocytes Absolute: 0.9 10*3/uL (ref 0.1–1.0)
NEUTROS PCT: 72 %
Neutro Abs: 6.5 10*3/uL (ref 1.7–7.7)
Platelets: 180 10*3/uL (ref 150–400)
RBC: 2.51 MIL/uL — ABNORMAL LOW (ref 4.22–5.81)
RDW: 13.5 % (ref 11.5–15.5)
WBC: 8.9 10*3/uL (ref 4.0–10.5)
nRBC: 0 % (ref 0.0–0.2)

## 2018-01-25 LAB — URINALYSIS, COMPLETE (UACMP) WITH MICROSCOPIC
Bilirubin Urine: NEGATIVE
Glucose, UA: NEGATIVE mg/dL
Hgb urine dipstick: NEGATIVE
Ketones, ur: NEGATIVE mg/dL
LEUKOCYTES UA: NEGATIVE
Nitrite: NEGATIVE
Protein, ur: 30 mg/dL — AB
Specific Gravity, Urine: 1.019 (ref 1.005–1.030)
pH: 5 (ref 5.0–8.0)

## 2018-01-25 LAB — COMPREHENSIVE METABOLIC PANEL
ALT: 20 U/L (ref 0–44)
ANION GAP: 9 (ref 5–15)
AST: 43 U/L — AB (ref 15–41)
Albumin: 3.2 g/dL — ABNORMAL LOW (ref 3.5–5.0)
Alkaline Phosphatase: 75 U/L (ref 38–126)
BUN: 42 mg/dL — ABNORMAL HIGH (ref 8–23)
CALCIUM: 8.4 mg/dL — AB (ref 8.9–10.3)
CHLORIDE: 110 mmol/L (ref 98–111)
CO2: 23 mmol/L (ref 22–32)
Creatinine, Ser: 1.6 mg/dL — ABNORMAL HIGH (ref 0.61–1.24)
GFR calc non Af Amer: 38 mL/min — ABNORMAL LOW (ref >60)
GFR, EST AFRICAN AMERICAN: 44 mL/min — AB (ref >60)
GLUCOSE: 136 mg/dL — AB (ref 70–99)
POTASSIUM: 3.4 mmol/L — AB (ref 3.5–5.1)
SODIUM: 142 mmol/L (ref 135–145)
Total Bilirubin: 0.7 mg/dL (ref 0.3–1.2)
Total Protein: 6.3 g/dL — ABNORMAL LOW (ref 6.5–8.1)

## 2018-01-25 NOTE — Assessment & Plan Note (Addendum)
#  Extensive stage small cell lung cancer- status post 4 cycles carbo-etop-tecentriq- CT chest- STABLE: one liver lesion bigger [42mm]. Over all STABLE.  Tecentriq currently on hold because of worsening brain metastases [see discussion below].  #Continue to hold Tecentriq at this time.  Discussed that would continue to hold Tecentriq for the next 1 month while waiting to finish radiation.  Discussed that if clinically stable in the next 1 month or so we would reinitiate systemic therapy with Tecentriq.  Discussed that it is possible that we might not be able to start Tecentriq if his performance status declines/symptomatically worse.  #Brain mets-small cell lung cancer.  On whole brain radiation until December 18.  Stable.  #Chronic CHF EF- 40-45%. continue lasix 20 BID/ K 20 .  Stable  # Dizzy spells- borderline blood pressure; HOLD off Lotrel.  Creatinine elevated at 1.6 baseline around 1.2.  Continue Lasix given history of CHF.  #Increased frequency of urination difficulty urination-history of prostatectomy.;  Check UA.  Discussed regarding Flomax however hold off given dizzy spells.  #Discussed CODE STATUS; would recommend against DNR/DNI.  No decisions made.  Awaiting evaluation with [facility] today.    DISPOSITION:  # check UA-  # follow up in  1 month--MDlabs- cbc/cmp; possible Tecentriq-Dr.B

## 2018-01-25 NOTE — Progress Notes (Signed)
[facility]  [phone] [phone]   Name: Phillip Sanchez Date: 01/25/2018 MRN: [id number]  DOB: [birth year]  Patient Care Team: Idelle Crouch, MD as PCP - General (Internal Medicine) Telford Nab, RN as Registered Nurse Fath, Javier Docker, MD as Consulting Physician (Cardiology) Cammie Sickle, MD as Medical Oncologist (Medical Oncology)    REASON FOR CONSULTATION: Palliative Care consult requested for this 84 y.o. 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  Patient was referred to palliative care to address goals.   SOCIAL HISTORY:    Patient lives at home with his wife.  He has 4 children who lives nearby.  ADVANCE DIRECTIVES:  Does not have  CODE STATUS:   PAST MEDICAL HISTORY: Past Medical History:  Diagnosis Date  . Anemia   . CHF (congestive heart failure) ([place])   . Colon cancer [facility])    He states they removed cancerous polyps.  . Hypercholesteremia   . Hypertension   . Lung cancer ([place])   . Myocardial infarction ([place])   . Osteoarthritis   . Prostate cancer [facility])    Prostatectomy.  . Small cell lung cancer ([place]) 08/04/2017    PAST SURGICAL HISTORY:  Past Surgical History:  Procedure Laterality Date  . COLON SURGERY    . CORONARY ANGIOPLASTY WITH STENT PLACEMENT    . herniorrhaphy    . PORTA CATH INSERTION N/A 08/05/2017   Procedure: PORTA CATH INSERTION;  Surgeon: Katha Cabal, MD;  Location: [facility];  Service: Cardiovascular;  Laterality: N/A;    HEMATOLOGY/ONCOLOGY HISTORY:  Oncology History   # 2015- [facility] [place] ca stage I s/p SBRT [Dr.Crystal]  # 3rd June 2019- Multiple liver lesions- s/p Bx- SMALL CELL of lung  origin; STAGE IV;  # June 26th- carbo-etop-atezo  # NOV 17th 2019-more than 20 brain lesions the largest 1.2 mm right cerebellum-recommend whole brain radiation [finish [id number]]  # Prostate cancer [ 31 years ago]; s/p surgery  # colon cancer [s/p resection of malignant polyps; Dr/Elliot; No colectomy]  # hx CAD [asprin/plavix]; hx PVD  -----------------------------------------------   DIAGNOSIS: Small cell lung cancer  STAGE:  IV   ;GOALS: Palliative  CURRENT/MOST RECENT THERAPY: carbo-etop-Atezo      Cancer of lower lobe of left lung ([place])   11/22/2017 -  Chemotherapy    The patient had atezolizumab (TECENTRIQ) 1,200 mg in sodium chloride 0.9 % 250 mL chemo infusion, 1,200 mg, Intravenous, Once, 2 of 6 cycles Administration: 1,200 mg (11/23/2017), 1,200 mg (12/14/2017)  for chemotherapy treatment.      Small cell lung cancer ([place])   08/04/2017 Initial Diagnosis    Small cell lung cancer ([place])    08/04/2017 - 11/17/2017 Chemotherapy    The patient had palonosetron (ALOXI) injection 0.25 mg, 0.25 mg, Intravenous,  Once, 4 of 4 cycles Administration: 0.25 mg (08/10/2017), 0.25 mg (09/07/2017), 0.25 mg (10/04/2017), 0.25 mg (10/26/2017) pegfilgrastim-cbqv (UDENYCA) injection 6 mg, 6 mg, Subcutaneous, Once, 4 of 4 cycles Administration: 6 mg (08/15/2017), 6 mg (10/07/2017), 6 mg (10/31/2017) CARBOplatin (PARAPLATIN) 340 mg in sodium chloride 0.9 % 250 mL chemo infusion, 340 mg (100 % of original dose 340.5 mg), Intravenous,  Once, 4 of 4 cycles Dose modification:   (original dose  340.5 mg, Cycle 1),   (original dose 431 mg, Cycle 2) Administration: 340 mg (08/10/2017), 430 mg (09/07/2017), 430 mg (10/04/2017), 430 mg (10/26/2017) etoposide (VEPESID) 200 mg in sodium chloride 0.9 % 500 mL chemo infusion, 210 mg, Intravenous,  Once, 4 of 4 cycles Dose modification: 80 mg/m2 (original dose 100 mg/m2, Cycle 3, Reason: Provider Judgment) Administration: 200 mg (08/10/2017), 200 mg (08/11/2017),  200 mg (08/12/2017), 200 mg (09/07/2017), 200 mg (09/08/2017), 160 mg (10/04/2017), 160 mg (10/05/2017), 160 mg (10/06/2017), 160 mg (10/26/2017), 160 mg (10/27/2017), 160 mg (10/28/2017) fosaprepitant (EMEND) 150 mg, dexamethasone (DECADRON) 12 mg in sodium chloride 0.9 % 145 mL IVPB, , Intravenous,  Once, 4 of 4 cycles Administration:  (08/10/2017),  (09/07/2017),  (10/04/2017),  (10/26/2017) atezolizumab (TECENTRIQ) 1,200 mg in sodium chloride 0.9 % 250 mL chemo infusion, 1,200 mg, Intravenous, Once, 4 of 4 cycles Administration: 1,200 mg (08/10/2017), 1,200 mg (09/07/2017), 1,200 mg (10/04/2017), 1,200 mg (10/26/2017)  for chemotherapy treatment.      ALLERGIES:  has No Known Allergies.  MEDICATIONS:  Current Outpatient Medications  Medication Sig Dispense Refill  . amLODipine-benazepril (LOTREL) 10-40 MG capsule Take 1 capsule by mouth daily.    Marland Kitchen aspirin EC 81 MG tablet Take 81 mg by mouth daily.     Marland Kitchen atorvastatin (LIPITOR) 20 MG tablet Take 20 mg by mouth at bedtime.     . calcium carbonate (OS-CAL - DOSED IN MG OF ELEMENTAL CALCIUM) 1250 (500 Ca) MG tablet Take 1 tablet by mouth daily.    . carvedilol (COREG) 25 MG tablet Take 25 mg by mouth 2 (two) times daily.     . cetirizine (ZYRTEC) 10 MG tablet Take 1 tablet by mouth daily.    . clopidogrel (PLAVIX) 75 MG tablet Take 75 mg by mouth daily.     Marland Kitchen dexamethasone (DECADRON) 4 MG tablet Take 1 tablet (4 mg total) by mouth daily. 20 tablet 0  . docusate sodium (COLACE) 100 MG capsule Take 1 capsule (100 mg total) by mouth daily as needed. (Patient not taking: Reported on 01/04/2018) 30 capsule 2  . esomeprazole (NEXIUM) 20 MG capsule Take 1 capsule (20 mg total) by mouth daily. 90 capsule 1  . fexofenadine-pseudoephedrine (ALLEGRA-D 24) 180-240 MG per 24 hr tablet Take 1 tablet by mouth daily as needed (for allergies).     . fluticasone (FLONASE) 50 MCG/ACT nasal spray Place 1 spray into the nose daily as needed for allergies or rhinitis.     .  Fluticasone-Salmeterol (ADVAIR DISKUS) 500-50 MCG/DOSE AEPB Inhale 1 puff into the lungs 2 (two) times daily. (Patient not taking: Reported on 01/04/2018) 1 each 3  . furosemide (LASIX) 20 MG tablet Take 1 tablet (20 mg total) by mouth 2 (two) times daily. (Patient taking differently: Take 20 mg by mouth daily. ) 60 tablet 3  . Multiple Vitamin (MULTI-VITAMINS) TABS Take 1 tablet by mouth daily.     . Oxycodone HCl 10 MG TABS Take 1 tablet (10 mg total) by mouth every 6 (six) hours as needed. (Patient not taking: Reported on 01/04/2018) 30 tablet 0  . polyethylene glycol (MIRALAX / GLYCOLAX) packet Take 17 g by mouth daily. (Patient not taking: Reported on 01/25/2018) 30 each 0  . potassium chloride 20 MEQ TBCR Take 20 mEq by mouth daily. 60 tablet 3  . prochlorperazine (COMPAZINE) 10 MG tablet Take 1 tablet (10 mg total) by mouth every 6 (six) hours as needed for nausea or vomiting. (Patient not taking: Reported on 01/04/2018)  40 tablet 1  . ranitidine (ZANTAC) 300 MG tablet Take 300 mg by mouth at bedtime.    Marland Kitchen rOPINIRole (REQUIP) 1 MG tablet Take 1 mg by mouth at bedtime.     . senna (SENOKOT) 8.6 MG TABS tablet Take 1 tablet (8.6 mg total) by mouth daily. (Patient not taking: Reported on 01/04/2018) 120 each 1  . temazepam (RESTORIL) 15 MG capsule Take 15-30 mg by mouth at bedtime as needed for sleep.      No current facility-administered medications for this visit.     VITAL SIGNS: There were no vitals taken for this visit. There were no vitals filed for this visit.  Estimated body mass index is 24.65 kg/m as calculated from the following:   Height as of 01/04/18: _0  (1.778 m).   Weight as of 01/04/18: 171 lb 12.8 oz (77.9 kg).  LABS: CBC:    Component Value Date/Time   WBC 8.9 01/25/2018 1023   HGB 8.5 (L) 01/25/2018 1023   HGB 14.3 01/24/2014 1057   HCT 26.6 (L) 01/25/2018 1023   HCT 42.7 01/24/2014 1057   PLT 180 01/25/2018 1023   PLT 218 01/24/2014 1057   MCV 106.0 (H)  01/25/2018 1023   MCV 99 01/24/2014 1057   NEUTROABS 6.5 01/25/2018 1023   NEUTROABS 4.4 01/24/2014 1057   LYMPHSABS 0.7 01/25/2018 1023   LYMPHSABS 1.5 01/24/2014 1057   MONOABS 0.9 01/25/2018 1023   MONOABS 0.8 01/24/2014 1057   EOSABS 0.7 (H) 01/25/2018 1023   EOSABS 0.3 01/24/2014 1057   BASOSABS 0.1 01/25/2018 1023   BASOSABS 0.1 01/24/2014 1057   Comprehensive Metabolic Panel:    Component Value Date/Time   NA 142 01/25/2018 1023   NA 143 01/24/2014 1057   K 3.4 (L) 01/25/2018 1023   K 3.9 01/24/2014 1057   CL 110 01/25/2018 1023   CL 106 01/24/2014 1057   CO2 23 01/25/2018 1023   CO2 27 01/24/2014 1057   BUN 42 (H) 01/25/2018 1023   BUN 24 (H) 01/24/2014 1057   CREATININE 1.60 (H) 01/25/2018 1023   CREATININE 1.19 01/24/2014 1057   GLUCOSE 136 (H) 01/25/2018 1023   GLUCOSE 105 (H) 01/24/2014 1057   CALCIUM 8.4 (L) 01/25/2018 1023   CALCIUM 8.9 01/24/2014 1057   AST 43 (H) 01/25/2018 1023   AST 19 01/24/2014 1057   ALT 20 01/25/2018 1023   ALT 25 01/24/2014 1057   ALKPHOS 75 01/25/2018 1023   ALKPHOS 73 01/24/2014 1057   BILITOT 0.7 01/25/2018 1023   BILITOT 0.6 01/24/2014 1057   PROT 6.3 (L) 01/25/2018 1023   PROT 6.8 01/24/2014 1057   ALBUMIN 3.2 (L) 01/25/2018 1023   ALBUMIN 3.5 01/24/2014 1057    RADIOGRAPHIC STUDIES: Mr Jeri Cos JS Contrast  Result Date: 01/01/2018 CLINICAL DATA:  Small-cell lung cancer with brain metastases. Generalized weakness and imbalance. Ongoing chemotherapy. EXAM: MRI HEAD WITHOUT AND WITH CONTRAST TECHNIQUE: Multiplanar, multiecho pulse sequences of the brain and surrounding structures were obtained without and with intravenous contrast. CONTRAST:  7 mL Gadavist COMPARISON:  08/17/2017 FINDINGS: Brain: There is no evidence of acute infarct, midline shift, or extra-axial fluid collection. Mild cerebral atrophy is within normal limits for age. Patchy chronic cerebral white matter T2 hyperintensities are similar to the prior study and  nonspecific but compatible with mild-to-moderate chronic small vessel ischemic disease. A 5 mm enhancing lesion in the left temporal lobe has not significantly changed (series 16, image 67). Enhancing lesion in the left  cerebellar tonsil has decreased in size, now measuring 3 mm (series 16, image 34). There are approximately 22 new enhancing lesions involving the cerebrum and cerebellum. The largest supratentorial lesion measures 7 mm at the level of the left caudate head. The largest cerebellar lesion measures 13 mm in the posterior right cerebellar hemisphere. There is up to mild edema associated with these lesions, greatest in the right cerebellum without associated mass effect. Chronic blood products are associated with left caudate and left temporal lobe lesions. Vascular: Major intracranial vascular flow voids are preserved. Skull and upper cervical spine: Unremarkable bone marrow signal. Sinuses/Orbits: Unremarkable orbits. Minimal right maxillary sinus mucosal thickening. Clear mastoid air cells. Other: None. IMPRESSION: 1. Stable to decreased size of left temporal and left cerebellar metastases. 2. 20+ new subcentimeter metastases as above. At most mild edema without mass effect. Electronically Signed   By: Logan Bores M.D.   On: 01/01/2018 08:28    PERFORMANCE STATUS (ECOG) : 2 - Symptomatic, <50% confined to bed  Review of Systems As noted above. Otherwise, a complete review of systems is negative.  Physical Exam General: NAD, frail appearing, thin, in chair Pulmonary: Unlabored Abdomen: soft, nontender, + bowel sounds Extremities: no edema Skin: no rashes Neurological: Weakness but otherwise nonfocal  IMPRESSION: I met today with patient and his son.  Both are able to relate to me their conversation earlier today with Dr. Rogue Bussing.  Treatment is currently being held while patient receives radiation.  Patient has had some progressive weakness with a recent fall.  It is possible that  his performance status will decline and exclude him from future treatment.  Patient seems to recognize this possibility.    Patient lives at home with his wife, who has dementia. He reports being mostly independent with his care. However, he is concerned about increasing weakness and the need for future care. Will refer him to see OT.   Symptomatically, patient denies current pain or other distressing symptoms. He does have some numbness to the legs but this is not painful. He reports that his appetite has actually increased in recent weeks.   Patient does not have an ACP. He would want his son to be involved in medical decision making. He is interested in establishing a HCPOA/Living will. I reviewed with him the paperwork. I also reviewed with him a MOST form. However, he was late for his appointment with rad onc and asked that we complete it at our next visit.   PLAN: Treatment plan per oncology Referral to see OT ACP/MOST form to be completed at next visit RTC in 2 weeks   Patient expressed understanding and was in agreement with this plan. He also understands that He can call clinic at any time with any questions, concerns, or complaints.     Time Total: 30 minutes  Visit consisted of counseling and education dealing with the complex and emotionally intense issues of symptom management and palliative care in the setting of serious and potentially life-threatening illness.Greater than 50%  of this time was spent counseling and coordinating care related to the above assessment and plan.  Signed by: Altha Harm, PhD, NP-C [phone] (Work Cell)

## 2018-01-25 NOTE — Progress Notes (Signed)
Patient here for follow-up with Dr. Rogue Bussing. Patient attempted to stand up from sitting positon in cancer center lobby. Stated that he felt lightheaded and dizzy upon standing. Rn escorted patient to chair. bp sitting taking 101/58 HR - 72; patient sat in chair for 5 mins and then proceeded to get in wheelchair. BP dropped - 98/50 with HR of 74. Pt brought to exam room for further evaluation. Md made aware of patient concerns.

## 2018-01-25 NOTE — Progress Notes (Signed)
[place] OFFICE PROGRESS NOTE  Patient Care Team: Idelle Crouch, MD as PCP - General (Internal Medicine) Telford Nab, RN as Registered Nurse Fath, Javier Docker, MD as Consulting Physician (Cardiology) Cammie Sickle, MD as Medical Oncologist (Medical Oncology)  Cancer Staging No matching staging information was found for the patient.   Oncology History   # 2015- LLL [place] ca stage I s/p SBRT [Dr.Crystal]  # 3rd June 2019- Multiple liver lesions- s/p Bx- SMALL CELL of lung origin; STAGE IV;  # June 26th- carbo-etop-atezo  # NOV 17th 2019-more than 20 brain lesions the largest 1.2 mm right cerebellum-recommend whole brain radiation  # Prostate cancer [ 70 years ago]; s/p surgery  # colon cancer [s/p resection of malignant polyps; Dr/Elliot; No colectomy]  # hx CAD [asprin/plavix]; hx PVD  -----------------------------------------------   DIAGNOSIS: Small cell lung cancer  STAGE:  IV   ;GOALS: Palliative  CURRENT/MOST RECENT THERAPY: carbo-etop-Atezo      Cancer of lower lobe of left lung ([place])   11/22/2017 -  Chemotherapy    The patient had atezolizumab (TECENTRIQ) 1,200 mg in sodium chloride 0.9 % 250 mL chemo infusion, 1,200 mg, Intravenous, Once, 2 of 6 cycles Administration: 1,200 mg (11/23/2017), 1,200 mg (12/14/2017)  for chemotherapy treatment.      Small cell lung cancer ([place])   08/04/2017 Initial Diagnosis    Small cell lung cancer ([place])    08/04/2017 - 11/17/2017 Chemotherapy    The patient had palonosetron (ALOXI) injection 0.25 mg, 0.25 mg, Intravenous,  Once, 4 of 4 cycles Administration: 0.25 mg (08/10/2017), 0.25 mg (09/07/2017), 0.25 mg (10/04/2017), 0.25 mg (10/26/2017) pegfilgrastim-cbqv (UDENYCA) injection 6 mg, 6 mg, Subcutaneous, Once, 4 of 4 cycles Administration: 6 mg (08/15/2017), 6 mg (10/07/2017), 6 mg (10/31/2017) CARBOplatin (PARAPLATIN) 340 mg in sodium chloride 0.9 % 250 mL chemo infusion, 340 mg (100 % of original dose  340.5 mg), Intravenous,  Once, 4 of 4 cycles Dose modification:   (original dose 340.5 mg, Cycle 1),   (original dose 431 mg, Cycle 2) Administration: 340 mg (08/10/2017), 430 mg (09/07/2017), 430 mg (10/04/2017), 430 mg (10/26/2017) etoposide (VEPESID) 200 mg in sodium chloride 0.9 % 500 mL chemo infusion, 210 mg, Intravenous,  Once, 4 of 4 cycles Dose modification: 80 mg/m2 (original dose 100 mg/m2, Cycle 3, Reason: Provider Judgment) Administration: 200 mg (08/10/2017), 200 mg (08/11/2017), 200 mg (08/12/2017), 200 mg (09/07/2017), 200 mg (09/08/2017), 160 mg (10/04/2017), 160 mg (10/05/2017), 160 mg (10/06/2017), 160 mg (10/26/2017), 160 mg (10/27/2017), 160 mg (10/28/2017) fosaprepitant (EMEND) 150 mg, dexamethasone (DECADRON) 12 mg in sodium chloride 0.9 % 145 mL IVPB, , Intravenous,  Once, 4 of 4 cycles Administration:  (08/10/2017),  (09/07/2017),  (10/04/2017),  (10/26/2017) atezolizumab (TECENTRIQ) 1,200 mg in sodium chloride 0.9 % 250 mL chemo infusion, 1,200 mg, Intravenous, Once, 4 of 4 cycles Administration: 1,200 mg (08/10/2017), 1,200 mg (09/07/2017), 1,200 mg (10/04/2017), 1,200 mg (10/26/2017)  for chemotherapy treatment.        INTERVAL HISTORY:  Phillip Sanchez 84 y.o.  male pleasant patient above history of extensive stage small cell lung cancer currently getting whole brain radiation therapy for brain metastasis is here for follow-up.  Patient finishes his radiation on 18 December.  Patient has been tolerating radiation fairly well.  However complains of dizziness in the morning today.  His appetite is fair.  Positive weight loss.  Denies any nausea vomiting.  Denies any swelling in the legs.  Complains of difficulty urination increased  frequency.  No pain.  No black pain.   Review of Systems  Constitutional: Positive for malaise/fatigue and weight loss. Negative for chills, diaphoresis and fever.  HENT: Negative for nosebleeds and sore throat.   Eyes: Negative for double vision.   Respiratory: Negative for hemoptysis, sputum production and wheezing.   Cardiovascular: Negative for chest pain, palpitations and orthopnea.  Gastrointestinal: Negative for abdominal pain, blood in stool, constipation, diarrhea, heartburn and melena.  Genitourinary: Positive for frequency and urgency. Negative for dysuria.  Musculoskeletal: Negative for back pain and joint pain.  Skin: Negative.  Negative for itching and rash.  Neurological: Positive for dizziness. Negative for tingling, focal weakness, weakness and headaches.  Endo/Heme/Allergies: Does not bruise/bleed easily.  Psychiatric/Behavioral: Negative for depression. The patient is not nervous/anxious and does not have insomnia.       PAST MEDICAL HISTORY :  Past Medical History:  Diagnosis Date  . Anemia   . CHF (congestive heart failure) ([place])   . Colon cancer [facility])    He states they removed cancerous polyps.  . Hypercholesteremia   . Hypertension   . Lung cancer ([place])   . Myocardial infarction ([place])   . Osteoarthritis   . Prostate cancer [facility])    Prostatectomy.  . Small cell lung cancer ([place]) 08/04/2017    PAST SURGICAL HISTORY :   Past Surgical History:  Procedure Laterality Date  . COLON SURGERY    . CORONARY ANGIOPLASTY WITH STENT PLACEMENT    . herniorrhaphy    . PORTA CATH INSERTION N/A 08/05/2017   Procedure: PORTA CATH INSERTION;  Surgeon: Katha Cabal, MD;  Location: [place] CV [facility];  Service: Cardiovascular;  Laterality: N/A;    FAMILY HISTORY :   Family History  Problem Relation Age of Onset  . Cancer Brother 37       unknown orgin  . Cancer Sister 75       unknown type    SOCIAL HISTORY:   Social History   Tobacco Use  . Smoking status: Former Smoker    Packs/day: 1.00    Years: 36.00    Pack years: 36.00    Types: Cigarettes    Last attempt to quit: 1987    Years since quitting: 32.9  . Smokeless tobacco: Never Used  Substance Use Topics  . Alcohol use: No     Alcohol/week: 0.0 standard drinks  . Drug use: No    ALLERGIES:  has No Known Allergies.  MEDICATIONS:  Current Outpatient Medications  Medication Sig Dispense Refill  . amLODipine-benazepril (LOTREL) 10-40 MG capsule Take 1 capsule by mouth daily.    Marland Kitchen aspirin EC 81 MG tablet Take 81 mg by mouth daily.     Marland Kitchen atorvastatin (LIPITOR) 20 MG tablet Take 20 mg by mouth at bedtime.     . calcium carbonate (OS-CAL - DOSED IN MG OF ELEMENTAL CALCIUM) 1250 (500 Ca) MG tablet Take 1 tablet by mouth daily.    . carvedilol (COREG) 25 MG tablet Take 25 mg by mouth 2 (two) times daily.     . cetirizine (ZYRTEC) 10 MG tablet Take 1 tablet by mouth daily.    . clopidogrel (PLAVIX) 75 MG tablet Take 75 mg by mouth daily.     Marland Kitchen dexamethasone (DECADRON) 4 MG tablet Take 1 tablet (4 mg total) by mouth daily. 20 tablet 0  . esomeprazole (NEXIUM) 20 MG capsule Take 1 capsule (20 mg total) by mouth daily. 90 capsule 1  . fexofenadine-pseudoephedrine (ALLEGRA-D 24)  180-240 MG per 24 hr tablet Take 1 tablet by mouth daily as needed (for allergies).     . fluticasone (FLONASE) 50 MCG/ACT nasal spray Place 1 spray into the nose daily as needed for allergies or rhinitis.     . furosemide (LASIX) 20 MG tablet Take 1 tablet (20 mg total) by mouth 2 (two) times daily. (Patient taking differently: Take 20 mg by mouth daily. ) 60 tablet 3  . Multiple Vitamin (MULTI-VITAMINS) TABS Take 1 tablet by mouth daily.     . potassium chloride 20 MEQ TBCR Take 20 mEq by mouth daily. 60 tablet 3  . ranitidine (ZANTAC) 300 MG tablet Take 300 mg by mouth at bedtime.    Marland Kitchen rOPINIRole (REQUIP) 1 MG tablet Take 1 mg by mouth at bedtime.     . temazepam (RESTORIL) 15 MG capsule Take 15-30 mg by mouth at bedtime as needed for sleep.     Marland Kitchen docusate sodium (COLACE) 100 MG capsule Take 1 capsule (100 mg total) by mouth daily as needed. (Patient not taking: Reported on 01/04/2018) 30 capsule 2  . Fluticasone-Salmeterol (ADVAIR DISKUS) 500-50  MCG/DOSE AEPB Inhale 1 puff into the lungs 2 (two) times daily. (Patient not taking: Reported on 01/04/2018) 1 each 3  . Oxycodone HCl 10 MG TABS Take 1 tablet (10 mg total) by mouth every 6 (six) hours as needed. (Patient not taking: Reported on 01/04/2018) 30 tablet 0  . polyethylene glycol (MIRALAX / GLYCOLAX) packet Take 17 g by mouth daily. (Patient not taking: Reported on 01/25/2018) 30 each 0  . prochlorperazine (COMPAZINE) 10 MG tablet Take 1 tablet (10 mg total) by mouth every 6 (six) hours as needed for nausea or vomiting. (Patient not taking: Reported on 01/04/2018) 40 tablet 1  . senna (SENOKOT) 8.6 MG TABS tablet Take 1 tablet (8.6 mg total) by mouth daily. (Patient not taking: Reported on 01/04/2018) 120 each 1   No current facility-administered medications for this visit.     PHYSICAL EXAMINATION: ECOG PERFORMANCE STATUS: 1 - Symptomatic but completely ambulatory  BP (!) 98/50 Comment: standing  Pulse 74   Temp (!) 96.1 F (35.6 C) (Oral)   Resp 16   There were no vitals filed for this visit.  Physical Exam  Constitutional: He is oriented to person, place, and time and well-developed, well-nourished, and in no distress.  He is in a wheelchair..Elderly male patient; accompanied by his son.   HENT:  Head: Normocephalic and atraumatic.  Mouth/Throat: Oropharynx is clear and moist. No oropharyngeal exudate.  Eyes: Pupils are equal, round, and reactive to light.  Neck: Normal range of motion. Neck supple.  Cardiovascular: Normal rate and regular rhythm.  Pulmonary/Chest: No respiratory distress. He has no wheezes.  Decreased breath sounds bilaterally.  Abdominal: Soft. Bowel sounds are normal. He exhibits no distension and no mass. There is no tenderness. There is no rebound and no guarding.  Musculoskeletal: Normal range of motion. He exhibits no edema or tenderness.  Neurological: He is alert and oriented to person, place, and time.  Skin: Skin is warm.  Psychiatric:  Affect normal.       LABORATORY DATA:  I have reviewed the data as listed    Component Value Date/Time   NA 142 01/25/2018 1023   NA 143 01/24/2014 1057   K 3.4 (L) 01/25/2018 1023   K 3.9 01/24/2014 1057   CL 110 01/25/2018 1023   CL 106 01/24/2014 1057   CO2 23 01/25/2018 1023  CO2 27 01/24/2014 1057   GLUCOSE 136 (H) 01/25/2018 1023   GLUCOSE 105 (H) 01/24/2014 1057   BUN 42 (H) 01/25/2018 1023   BUN 24 (H) 01/24/2014 1057   CREATININE 1.60 (H) 01/25/2018 1023   CREATININE 1.19 01/24/2014 1057   CALCIUM 8.4 (L) 01/25/2018 1023   CALCIUM 8.9 01/24/2014 1057   PROT 6.3 (L) 01/25/2018 1023   PROT 6.8 01/24/2014 1057   ALBUMIN 3.2 (L) 01/25/2018 1023   ALBUMIN 3.5 01/24/2014 1057   AST 43 (H) 01/25/2018 1023   AST 19 01/24/2014 1057   ALT 20 01/25/2018 1023   ALT 25 01/24/2014 1057   ALKPHOS 75 01/25/2018 1023   ALKPHOS 73 01/24/2014 1057   BILITOT 0.7 01/25/2018 1023   BILITOT 0.6 01/24/2014 1057   GFRNONAA 38 (L) 01/25/2018 1023   GFRNONAA >60 01/24/2014 1057   GFRNONAA 50 (L) 10/21/2013 0447   GFRAA 44 (L) 01/25/2018 1023   GFRAA >60 01/24/2014 1057   GFRAA 58 (L) 10/21/2013 0447    No results found for: SPEP, UPEP  Lab Results  Component Value Date   WBC 8.9 01/25/2018   NEUTROABS 6.5 01/25/2018   HGB 8.5 (L) 01/25/2018   HCT 26.6 (L) 01/25/2018   MCV 106.0 (H) 01/25/2018   PLT 180 01/25/2018      Chemistry      Component Value Date/Time   NA 142 01/25/2018 1023   NA 143 01/24/2014 1057   K 3.4 (L) 01/25/2018 1023   K 3.9 01/24/2014 1057   CL 110 01/25/2018 1023   CL 106 01/24/2014 1057   CO2 23 01/25/2018 1023   CO2 27 01/24/2014 1057   BUN 42 (H) 01/25/2018 1023   BUN 24 (H) 01/24/2014 1057   CREATININE 1.60 (H) 01/25/2018 1023   CREATININE 1.19 01/24/2014 1057      Component Value Date/Time   CALCIUM 8.4 (L) 01/25/2018 1023   CALCIUM 8.9 01/24/2014 1057   ALKPHOS 75 01/25/2018 1023   ALKPHOS 73 01/24/2014 1057   AST 43 (H)  01/25/2018 1023   AST 19 01/24/2014 1057   ALT 20 01/25/2018 1023   ALT 25 01/24/2014 1057   BILITOT 0.7 01/25/2018 1023   BILITOT 0.6 01/24/2014 1057       RADIOGRAPHIC STUDIES: I have personally reviewed the radiological images as listed and agreed with the findings in the report. No results found.   ASSESSMENT & PLAN:  [name] #Extensive stage small cell lung cancer- status post 4 cycles carbo-etop-tecentriq- CT chest- STABLE: one liver lesion bigger [13mm]. Over all STABLE.  Tecentriq currently on hold because of worsening brain metastases [see discussion below].  #Continue to hold Tecentriq at this time.  Discussed that would continue to hold Tecentriq for the next 1 month while waiting to finish radiation.  Discussed that if clinically stable in the next 1 month or so we would reinitiate systemic therapy with Tecentriq.  Discussed that it is possible that we might not be able to start Tecentriq if his performance status declines/symptomatically worse.  #Brain mets-small cell lung cancer.  On whole brain radiation until December 18.  Stable.  #Chronic CHF EF- 40-45%. continue lasix 20 BID/ K 20 .  Stable  # Dizzy spells- borderline blood pressure; HOLD off Lotrel.  Creatinine elevated at 1.6 baseline around 1.2.  Continue Lasix given history of CHF.  #Increased frequency of urination difficulty urination-history of prostatectomy.;  Check UA.  Discussed regarding Flomax however hold  off given dizzy spells.  #Discussed CODE STATUS; would recommend against DNR/DNI.  No decisions made.  Awaiting evaluation with Josh borders today.    DISPOSITION:  # check UA-  # follow up in  1 month--MDlabs- cbc/cmp; possible Tecentriq-Dr.B       Orders Placed This Encounter  Procedures  . Urinalysis, Complete w Microscopic    Standing Status:   Future    Number of Occurrences:   1    Standing Expiration Date:   01/26/2019   All questions were answered.  The patient knows to call the clinic with any problems, questions or concerns.      Cammie Sickle, MD 01/25/2018 11:58 AM

## 2018-01-25 NOTE — Patient Instructions (Signed)
#  Hold Lotrel-neck patient medication until further directions.

## 2018-01-26 ENCOUNTER — Ambulatory Visit
Admission: RE | Admit: 2018-01-26 | Discharge: 2018-01-26 | Disposition: A | Discharge status: Home / Self Care | Payer: Medicare Other | Source: Ambulatory Visit | Attending: Radiation Oncology | Admitting: Radiation Oncology

## 2018-01-26 DIAGNOSIS — C7931 Secondary malignant neoplasm of brain: Secondary | ICD-10-CM | POA: Diagnosis not present

## 2018-01-27 ENCOUNTER — Ambulatory Visit
Admission: RE | Admit: 2018-01-27 | Discharge: 2018-01-27 | Disposition: A | Discharge status: Home / Self Care | Payer: Medicare Other | Source: Ambulatory Visit | Attending: Radiation Oncology | Admitting: Radiation Oncology

## 2018-01-27 DIAGNOSIS — C7931 Secondary malignant neoplasm of brain: Secondary | ICD-10-CM | POA: Diagnosis not present

## 2018-01-30 ENCOUNTER — Ambulatory Visit
Admission: RE | Admit: 2018-01-30 | Discharge: 2018-01-30 | Disposition: A | Discharge status: Home / Self Care | Payer: Medicare Other | Source: Ambulatory Visit | Attending: Radiation Oncology | Admitting: Radiation Oncology

## 2018-01-30 DIAGNOSIS — C7931 Secondary malignant neoplasm of brain: Secondary | ICD-10-CM | POA: Diagnosis not present

## 2018-01-31 ENCOUNTER — Ambulatory Visit
Admission: RE | Admit: 2018-01-31 | Discharge: 2018-01-31 | Disposition: A | Discharge status: Home / Self Care | Payer: Medicare Other | Source: Ambulatory Visit | Attending: Radiation Oncology | Admitting: Radiation Oncology

## 2018-01-31 DIAGNOSIS — C7931 Secondary malignant neoplasm of brain: Secondary | ICD-10-CM | POA: Diagnosis not present

## 2018-02-01 ENCOUNTER — Ambulatory Visit
Admission: RE | Admit: 2018-02-01 | Discharge: 2018-02-01 | Disposition: A | Discharge status: Home / Self Care | Payer: Medicare Other | Source: Ambulatory Visit | Attending: Radiation Oncology | Admitting: Radiation Oncology

## 2018-02-01 DIAGNOSIS — C7931 Secondary malignant neoplasm of brain: Secondary | ICD-10-CM | POA: Diagnosis not present

## 2018-02-09 ENCOUNTER — Other Ambulatory Visit: Payer: Self-pay

## 2018-02-09 ENCOUNTER — Emergency Department: Payer: Medicare Other

## 2018-02-09 ENCOUNTER — Emergency Department
Admission: EM | Admit: 2018-02-09 | Discharge: 2018-02-09 | Disposition: A | Discharge status: Home / Self Care | Payer: Medicare Other | Source: Home / Self Care | Attending: Emergency Medicine | Admitting: Emergency Medicine

## 2018-02-09 ENCOUNTER — Encounter: Payer: Self-pay | Admitting: Emergency Medicine

## 2018-02-09 DIAGNOSIS — Z79899 Other long term (current) drug therapy: Secondary | ICD-10-CM | POA: Insufficient documentation

## 2018-02-09 DIAGNOSIS — Z7982 Long term (current) use of aspirin: Secondary | ICD-10-CM | POA: Insufficient documentation

## 2018-02-09 DIAGNOSIS — N3000 Acute cystitis without hematuria: Secondary | ICD-10-CM | POA: Diagnosis not present

## 2018-02-09 DIAGNOSIS — I110 Hypertensive heart disease with heart failure: Secondary | ICD-10-CM | POA: Insufficient documentation

## 2018-02-09 DIAGNOSIS — I509 Heart failure, unspecified: Secondary | ICD-10-CM | POA: Insufficient documentation

## 2018-02-09 DIAGNOSIS — Y9389 Activity, other specified: Secondary | ICD-10-CM

## 2018-02-09 DIAGNOSIS — Z87891 Personal history of nicotine dependence: Secondary | ICD-10-CM

## 2018-02-09 DIAGNOSIS — Y92009 Unspecified place in unspecified non-institutional (private) residence as the place of occurrence of the external cause: Secondary | ICD-10-CM | POA: Insufficient documentation

## 2018-02-09 DIAGNOSIS — S0990XA Unspecified injury of head, initial encounter: Secondary | ICD-10-CM | POA: Insufficient documentation

## 2018-02-09 DIAGNOSIS — W19XXXA Unspecified fall, initial encounter: Secondary | ICD-10-CM

## 2018-02-09 DIAGNOSIS — Y999 Unspecified external cause status: Secondary | ICD-10-CM | POA: Insufficient documentation

## 2018-02-09 DIAGNOSIS — R531 Weakness: Secondary | ICD-10-CM | POA: Diagnosis not present

## 2018-02-09 DIAGNOSIS — W1830XA Fall on same level, unspecified, initial encounter: Secondary | ICD-10-CM

## 2018-02-09 LAB — COMPREHENSIVE METABOLIC PANEL
ALT: 40 U/L (ref 0–44)
AST: 64 U/L — ABNORMAL HIGH (ref 15–41)
Albumin: 3.1 g/dL — ABNORMAL LOW (ref 3.5–5.0)
Alkaline Phosphatase: 75 U/L (ref 38–126)
Anion gap: 8 (ref 5–15)
BUN: 44 mg/dL — ABNORMAL HIGH (ref 8–23)
CO2: 24 mmol/L (ref 22–32)
Calcium: 8.6 mg/dL — ABNORMAL LOW (ref 8.9–10.3)
Chloride: 109 mmol/L (ref 98–111)
Creatinine, Ser: 1.2 mg/dL (ref 0.61–1.24)
GFR calc non Af Amer: 54 mL/min — ABNORMAL LOW (ref >60)
Glucose, Bld: 107 mg/dL — ABNORMAL HIGH (ref 70–99)
Potassium: 3.5 mmol/L (ref 3.5–5.1)
Sodium: 141 mmol/L (ref 135–145)
TOTAL PROTEIN: 5.9 g/dL — AB (ref 6.5–8.1)
Total Bilirubin: 1 mg/dL (ref 0.3–1.2)

## 2018-02-09 LAB — CBC
HEMATOCRIT: 27.1 % — AB (ref 39.0–52.0)
Hemoglobin: 8.7 g/dL — ABNORMAL LOW (ref 13.0–17.0)
MCH: 34.8 pg — AB (ref 26.0–34.0)
MCHC: 32.1 g/dL (ref 30.0–36.0)
MCV: 108.4 fL — AB (ref 80.0–100.0)
Platelets: 144 10*3/uL — ABNORMAL LOW (ref 150–400)
RBC: 2.5 MIL/uL — ABNORMAL LOW (ref 4.22–5.81)
RDW: 14.4 % (ref 11.5–15.5)
WBC: 10.3 10*3/uL (ref 4.0–10.5)
nRBC: 0 % (ref 0.0–0.2)

## 2018-02-09 LAB — CK: Total CK: 79 U/L (ref 49–397)

## 2018-02-09 LAB — TROPONIN I: Troponin I: 0.05 ng/mL (ref <0.03)

## 2018-02-09 NOTE — ED Provider Notes (Signed)
[facility] Emergency Department Provider Note  Time seen: 9:14 AM  I have reviewed the triage vital signs and the nursing notes.   HISTORY  Chief Complaint Fall    HPI Phillip Sanchez is a 84 y.o. male with a past medical history of anemia, CHF, hypertension, hyperlipidemia, presents to the emergency department after a fall.  According to the patient he got up to use the bathroom last night and fell at some point.  Patient states he laid on the ground for quite some time and was able to crawl back to the bed.  His son came over this morning and saw blood on the floor and on his head and called EMS to bring him to the emergency department.  Patient states for the past 3 weeks he has been experiencing left foot numbness and weakness.  States he was seen at the hospital in [place] several weeks ago for the same, and states he was ruled out for stroke.  Patient states 1 week ago he had a fall landing on his left side and cracked several ribs on the left side.  Patient does have old bruising to the left flank.  Patient denies loss of consciousness.  He is alert and oriented x3 able to give a good history at this time.   Past Medical History:  Diagnosis Date  . Anemia   . CHF (congestive heart failure) ([place])   . Colon cancer [facility])    He states they removed cancerous polyps.  . Hypercholesteremia   . Hypertension   . Lung cancer ([place])   . Myocardial infarction ([place])   . Osteoarthritis   . Prostate cancer [facility])    Prostatectomy.  . Small cell lung cancer ([place]) 08/04/2017    Patient Active Problem List   Diagnosis Date Noted  . Dysuria 12/14/2017  . Chronic systolic heart failure ([place]) 09/22/2017  . HTN (hypertension) 09/22/2017  . Acute diastolic CHF (congestive heart failure) ([place]) 09/09/2017  . Small cell lung cancer ([place]) 08/04/2017  . Liver masses   . Goals of care, counseling/discussion   . Palliative care by specialist   . CAP (community acquired  pneumonia) 07/26/2017  . Liver metastases ([place]) 07/19/2017  . Cancer of lower lobe of left lung ([place]) 01/16/2016    Past Surgical History:  Procedure Laterality Date  . COLON SURGERY    . CORONARY ANGIOPLASTY WITH STENT PLACEMENT    . herniorrhaphy    . PORTA CATH INSERTION N/A 08/05/2017   Procedure: PORTA CATH INSERTION;  Surgeon: Katha Cabal, MD;  Location: [place] CV LAB;  Service: Cardiovascular;  Laterality: N/A;    Prior to Admission medications   Medication Sig Start Date End Date Taking? Authorizing Provider  amLODipine-benazepril (LOTREL) 10-40 MG capsule Take 1 capsule by mouth daily.    [provider]  aspirin EC 81 MG tablet Take 81 mg by mouth daily.     [provider]  atorvastatin (LIPITOR) 20 MG tablet Take 20 mg by mouth at bedtime.     [provider]  calcium carbonate (OS-CAL - DOSED IN MG OF ELEMENTAL CALCIUM) 1250 (500 Ca) MG tablet Take 1 tablet by mouth daily.    [provider]  carvedilol (COREG) 25 MG tablet Take 25 mg by mouth 2 (two) times daily.     [provider]  cetirizine (ZYRTEC) 10 MG tablet Take 1 tablet by mouth daily.    [provider]  clopidogrel (PLAVIX) 75 MG  tablet Take 75 mg by mouth daily.     [provider]  dexamethasone (DECADRON) 4 MG tablet Take 1 tablet (4 mg total) by mouth daily. 01/17/18   Noreene Filbert, MD  docusate sodium (COLACE) 100 MG capsule Take 1 capsule (100 mg total) by mouth daily as needed. Patient not taking: Reported on 01/04/2018 07/18/17 07/18/18  Schuyler Amor, MD  esomeprazole (NEXIUM) 20 MG capsule Take 1 capsule (20 mg total) by mouth daily. 08/09/17   Verlon Au, NP  fexofenadine-pseudoephedrine (ALLEGRA-D 24) 180-240 MG per 24 hr tablet Take 1 tablet by mouth daily as needed (for allergies).     [provider]  fluticasone (FLONASE) 50 MCG/ACT nasal spray Place 1 spray into the nose daily as needed for allergies or  rhinitis.     [provider]  Fluticasone-Salmeterol (ADVAIR DISKUS) 500-50 MCG/DOSE AEPB Inhale 1 puff into the lungs 2 (two) times daily. Patient not taking: Reported on 01/04/2018 09/07/17   Cammie Sickle, MD  furosemide (LASIX) 20 MG tablet Take 1 tablet (20 mg total) by mouth 2 (two) times daily. Patient taking differently: Take 20 mg by mouth daily.  09/16/17   Cammie Sickle, MD  Multiple Vitamin (MULTI-VITAMINS) TABS Take 1 tablet by mouth daily.     [provider]  Oxycodone HCl 10 MG TABS Take 1 tablet (10 mg total) by mouth every 6 (six) hours as needed. Patient not taking: Reported on 01/04/2018 09/05/17   Earlie Server, MD  polyethylene glycol [facility]) packet Take 17 g by mouth daily. Patient not taking: Reported on 01/25/2018 08/09/17   Verlon Au, NP  potassium chloride 20 MEQ TBCR Take 20 mEq by mouth daily. 09/16/17   Cammie Sickle, MD  prochlorperazine (COMPAZINE) 10 MG tablet Take 1 tablet (10 mg total) by mouth every 6 (six) hours as needed for nausea or vomiting. Patient not taking: Reported on 01/04/2018 08/10/17   Cammie Sickle, MD  ranitidine (ZANTAC) 300 MG tablet Take 300 mg by mouth at bedtime.    [provider]  rOPINIRole (REQUIP) 1 MG tablet Take 1 mg by mouth at bedtime.     [provider]  senna (SENOKOT) 8.6 MG TABS tablet Take 1 tablet (8.6 mg total) by mouth daily. Patient not taking: Reported on 01/04/2018 08/09/17   Verlon Au, NP  temazepam (RESTORIL) 15 MG capsule Take 15-30 mg by mouth at bedtime as needed for sleep.     [provider]    No Known Allergies  Family History  Problem Relation Age of Onset  . Cancer Brother 7       unknown orgin  . Cancer Sister 78       unknown type    Social History Social History   Tobacco Use  . Smoking status: Former Smoker    Packs/day: 1.00    Years: 36.00    Pack years: 36.00    Types: Cigarettes    Last attempt  to quit: 1987    Years since quitting: 33.0  . Smokeless tobacco: Never Used  Substance Use Topics  . Alcohol use: No    Alcohol/week: 0.0 standard drinks  . Drug use: No    Review of Systems Constitutional: Negative for loss of consciousness Eyes: Swelling around left eye Cardiovascular: Left lateral chest wall pain x1 week. Respiratory: Negative for shortness of breath. Gastrointestinal: Negative for abdominal pain, vomiting  Musculoskeletal: Negative for musculoskeletal complaints Skin: Skin tear  to left elbow.  Laceration above left eyebrow.  Ecchymosis around left eye. Neurological: Negative for headache.  Left foot decreased sensation x3 weeks. All other ROS negative  ____________________________________________   PHYSICAL EXAM:  Constitutional: Alert and oriented, no distress. Eyes: Mild left periorbital ecchymosis/edema.  Small laceration above left eye ENT   Head: Mild left periorbital ecchymosis/edema with a small laceration above left eye, hemostatic.   Nose: No congestion/rhinnorhea.   Mouth/Throat: Mucous membranes are moist. Cardiovascular: Normal rate, regular rhythm.  Respiratory: Normal respiratory effort without tachypnea nor retractions. Breath sounds are clear Gastrointestinal: Soft and nontender. No distention.   Musculoskeletal: Great range of motion in all extremities, no tenderness identified. Neurologic:  Normal speech and language. No gross focal neurologic deficits Skin:  Skin is warm.  Patient does have older appearing bruising to the left chest wall/left flank.  Patient states he fell 1 week ago and was diagnosed with several rib fractures.  Minimally tender at this time.  Patient has a small approximately 1.5 cm skin tear to the left elbow, hemostatic.  Small approximately 1 cm laceration above left eye, hemostatic. Psychiatric: Mood and affect are normal. Speech and behavior are normal.   ____________________________________________     EKG  EKG viewed and interpreted by myself shows sinus bradycardia at 60 bpm with a widened QRS, normal axis, largely normal intervals, nonspecific ST changes.  Morphology most consistent with left bundle branch block.  ____________________________________________    RADIOLOGY  ED scans are negative for concerning acute abnormality.  ____________________________________________   INITIAL IMPRESSION / ASSESSMENT AND PLAN / ED COURSE  Pertinent labs & imaging results that were available during my care of the patient were reviewed by me and considered in my medical decision making (see chart for details).  Patient presents to the emergency department after a fall overnight.  Possible prolonged time on the ground.  We will check labs including a CK.  We will also obtain CT imaging of the head and face as a precaution.  Patient agreeable to plan of care.  Reassuringly no abdominal tenderness, no CT or L-spine tenderness.  Scans are negative for acute abnormality.  Patient has a known history of lung cancer, with brain metastases.  I suspect the patient is left lower extremity decrease in stations likely secondary to brain metastases/edema.  They have a follow-up appointment coming up with the oncologist.  Patient is currently being treated with prednisone per son for possible radiculopathy causing the left lower extremity numbness, well-known to his doctor.  We will discharge home with PCP follow-up.  Patient and son agreeable.  ____________________________________________   FINAL CLINICAL IMPRESSION(S) / ED DIAGNOSES  Fall Laceration    Harvest Dark, MD 02/09/18 1140

## 2018-02-09 NOTE — ED Notes (Signed)
Patient transported to CT 

## 2018-02-09 NOTE — ED Triage Notes (Signed)
Arrives by [facility].  Patient fell overnight in bathroom.  Patient was able to crawl back to bed.  Laceration to left eye and left eye swelling.  Patient has history of rib injury.  Also has lower extremity swelling.

## 2018-02-12 ENCOUNTER — Inpatient Hospital Stay: Payer: Medicare Other

## 2018-02-12 ENCOUNTER — Inpatient Hospital Stay
Admission: EM | Admit: 2018-02-12 | Discharge: 2018-03-18 | DRG: 689 | Disposition: E | Payer: Medicare Other | Attending: Internal Medicine | Admitting: Internal Medicine

## 2018-02-12 ENCOUNTER — Encounter: Payer: Self-pay | Admitting: Medical Oncology

## 2018-02-12 DIAGNOSIS — E7800 Pure hypercholesterolemia, unspecified: Secondary | ICD-10-CM | POA: Diagnosis present

## 2018-02-12 DIAGNOSIS — R627 Adult failure to thrive: Secondary | ICD-10-CM | POA: Diagnosis present

## 2018-02-12 DIAGNOSIS — C3432 Malignant neoplasm of lower lobe, left bronchus or lung: Secondary | ICD-10-CM | POA: Diagnosis present

## 2018-02-12 DIAGNOSIS — C3490 Malignant neoplasm of unspecified part of unspecified bronchus or lung: Secondary | ICD-10-CM | POA: Diagnosis present

## 2018-02-12 DIAGNOSIS — Z9181 History of falling: Secondary | ICD-10-CM

## 2018-02-12 DIAGNOSIS — C787 Secondary malignant neoplasm of liver and intrahepatic bile duct: Secondary | ICD-10-CM | POA: Diagnosis present

## 2018-02-12 DIAGNOSIS — F0390 Unspecified dementia without behavioral disturbance: Secondary | ICD-10-CM | POA: Diagnosis present

## 2018-02-12 DIAGNOSIS — I248 Other forms of acute ischemic heart disease: Secondary | ICD-10-CM | POA: Diagnosis present

## 2018-02-12 DIAGNOSIS — Z85038 Personal history of other malignant neoplasm of large intestine: Secondary | ICD-10-CM | POA: Diagnosis not present

## 2018-02-12 DIAGNOSIS — M1990 Unspecified osteoarthritis, unspecified site: Secondary | ICD-10-CM | POA: Diagnosis present

## 2018-02-12 DIAGNOSIS — Z8546 Personal history of malignant neoplasm of prostate: Secondary | ICD-10-CM | POA: Diagnosis not present

## 2018-02-12 DIAGNOSIS — E860 Dehydration: Secondary | ICD-10-CM | POA: Diagnosis present

## 2018-02-12 DIAGNOSIS — I429 Cardiomyopathy, unspecified: Secondary | ICD-10-CM | POA: Diagnosis present

## 2018-02-12 DIAGNOSIS — I509 Heart failure, unspecified: Secondary | ICD-10-CM | POA: Diagnosis not present

## 2018-02-12 DIAGNOSIS — Z923 Personal history of irradiation: Secondary | ICD-10-CM

## 2018-02-12 DIAGNOSIS — I252 Old myocardial infarction: Secondary | ICD-10-CM

## 2018-02-12 DIAGNOSIS — E440 Moderate protein-calorie malnutrition: Secondary | ICD-10-CM | POA: Diagnosis present

## 2018-02-12 DIAGNOSIS — N3000 Acute cystitis without hematuria: Principal | ICD-10-CM | POA: Diagnosis present

## 2018-02-12 DIAGNOSIS — Z8249 Family history of ischemic heart disease and other diseases of the circulatory system: Secondary | ICD-10-CM

## 2018-02-12 DIAGNOSIS — C7931 Secondary malignant neoplasm of brain: Secondary | ICD-10-CM | POA: Diagnosis not present

## 2018-02-12 DIAGNOSIS — I739 Peripheral vascular disease, unspecified: Secondary | ICD-10-CM | POA: Diagnosis present

## 2018-02-12 DIAGNOSIS — N390 Urinary tract infection, site not specified: Secondary | ICD-10-CM

## 2018-02-12 DIAGNOSIS — B9620 Unspecified Escherichia coli [E. coli] as the cause of diseases classified elsewhere: Secondary | ICD-10-CM | POA: Diagnosis present

## 2018-02-12 DIAGNOSIS — N179 Acute kidney failure, unspecified: Secondary | ICD-10-CM | POA: Diagnosis present

## 2018-02-12 DIAGNOSIS — Z9079 Acquired absence of other genital organ(s): Secondary | ICD-10-CM

## 2018-02-12 DIAGNOSIS — R531 Weakness: Secondary | ICD-10-CM | POA: Diagnosis not present

## 2018-02-12 DIAGNOSIS — Z66 Do not resuscitate: Secondary | ICD-10-CM | POA: Diagnosis present

## 2018-02-12 DIAGNOSIS — Z515 Encounter for palliative care: Secondary | ICD-10-CM | POA: Diagnosis not present

## 2018-02-12 DIAGNOSIS — I441 Atrioventricular block, second degree: Secondary | ICD-10-CM | POA: Diagnosis present

## 2018-02-12 DIAGNOSIS — Z9221 Personal history of antineoplastic chemotherapy: Secondary | ICD-10-CM

## 2018-02-12 DIAGNOSIS — Z6824 Body mass index (BMI) 24.0-24.9, adult: Secondary | ICD-10-CM

## 2018-02-12 DIAGNOSIS — Z955 Presence of coronary angioplasty implant and graft: Secondary | ICD-10-CM

## 2018-02-12 DIAGNOSIS — J449 Chronic obstructive pulmonary disease, unspecified: Secondary | ICD-10-CM | POA: Diagnosis not present

## 2018-02-12 DIAGNOSIS — Z87891 Personal history of nicotine dependence: Secondary | ICD-10-CM

## 2018-02-12 DIAGNOSIS — I2510 Atherosclerotic heart disease of native coronary artery without angina pectoris: Secondary | ICD-10-CM | POA: Diagnosis present

## 2018-02-12 DIAGNOSIS — I5032 Chronic diastolic (congestive) heart failure: Secondary | ICD-10-CM | POA: Diagnosis present

## 2018-02-12 DIAGNOSIS — D638 Anemia in other chronic diseases classified elsewhere: Secondary | ICD-10-CM | POA: Diagnosis present

## 2018-02-12 DIAGNOSIS — I110 Hypertensive heart disease with heart failure: Secondary | ICD-10-CM | POA: Diagnosis present

## 2018-02-12 DIAGNOSIS — G9341 Metabolic encephalopathy: Secondary | ICD-10-CM | POA: Diagnosis present

## 2018-02-12 LAB — BASIC METABOLIC PANEL
Anion gap: 12 (ref 5–15)
BUN: 33 mg/dL — ABNORMAL HIGH (ref 8–23)
CO2: 21 mmol/L — AB (ref 22–32)
Calcium: 8.4 mg/dL — ABNORMAL LOW (ref 8.9–10.3)
Chloride: 106 mmol/L (ref 98–111)
Creatinine, Ser: 1.13 mg/dL (ref 0.61–1.24)
GFR calc Af Amer: >60 mL/min (ref >60)
GFR calc non Af Amer: 58 mL/min — ABNORMAL LOW (ref >60)
Glucose, Bld: 112 mg/dL — ABNORMAL HIGH (ref 70–99)
Potassium: 3.6 mmol/L (ref 3.5–5.1)
Sodium: 139 mmol/L (ref 135–145)

## 2018-02-12 LAB — CBC WITH DIFFERENTIAL/PLATELET
Abs Immature Granulocytes: 0.06 10*3/uL (ref 0.00–0.07)
Basophils Absolute: 0.1 10*3/uL (ref 0.0–0.1)
Basophils Relative: 1 %
Eosinophils Absolute: 0.2 10*3/uL (ref 0.0–0.5)
Eosinophils Relative: 2 %
HEMATOCRIT: 27.3 % — AB (ref 39.0–52.0)
Hemoglobin: 8.8 g/dL — ABNORMAL LOW (ref 13.0–17.0)
Immature Granulocytes: 1 %
LYMPHS ABS: 0.3 10*3/uL — AB (ref 0.7–4.0)
Lymphocytes Relative: 3 %
MCH: 34.4 pg — ABNORMAL HIGH (ref 26.0–34.0)
MCHC: 32.2 g/dL (ref 30.0–36.0)
MCV: 106.6 fL — ABNORMAL HIGH (ref 80.0–100.0)
Monocytes Absolute: 1.5 10*3/uL — ABNORMAL HIGH (ref 0.1–1.0)
Monocytes Relative: 12 %
Neutro Abs: 10.4 10*3/uL — ABNORMAL HIGH (ref 1.7–7.7)
Neutrophils Relative %: 81 %
Platelets: 148 10*3/uL — ABNORMAL LOW (ref 150–400)
RBC: 2.56 MIL/uL — ABNORMAL LOW (ref 4.22–5.81)
RDW: 14.6 % (ref 11.5–15.5)
WBC: 12.5 10*3/uL — ABNORMAL HIGH (ref 4.0–10.5)
nRBC: 0 % (ref 0.0–0.2)

## 2018-02-12 LAB — URINALYSIS, COMPLETE (UACMP) WITH MICROSCOPIC
Bilirubin Urine: NEGATIVE
Glucose, UA: NEGATIVE mg/dL
Ketones, ur: 5 mg/dL — AB
Nitrite: NEGATIVE
Protein, ur: 30 mg/dL — AB
Specific Gravity, Urine: 1.015 (ref 1.005–1.030)
WBC, UA: >50 WBC/hpf — ABNORMAL HIGH (ref 0–5)
pH: 5 (ref 5.0–8.0)

## 2018-02-12 MED ORDER — SODIUM CHLORIDE 0.9 % IV SOLN
1.0000 g | Freq: Once | INTRAVENOUS | Status: AC
  Administered 2018-02-12: 1 g via INTRAVENOUS
  Filled 2018-02-12: qty 10

## 2018-02-12 MED ORDER — POLYETHYLENE GLYCOL 3350 17 G PO PACK: 17.0000 g | PACK | Freq: Every day | ORAL | Status: DC | PRN

## 2018-02-12 MED ORDER — ACETAMINOPHEN 325 MG PO TABS
650.0000 mg | ORAL_TABLET | Freq: Four times a day (QID) | ORAL | Status: DC | PRN
  Administered 2018-02-13: 650 mg via ORAL
  Filled 2018-02-12: qty 2

## 2018-02-12 MED ORDER — SODIUM CHLORIDE 0.9 % IV SOLN
1.0000 g | INTRAVENOUS | Status: DC
  Administered 2018-02-13 – 2018-02-14 (×2): 1 g via INTRAVENOUS
  Filled 2018-02-12: qty 10
  Filled 2018-02-12: qty 1
  Filled 2018-02-12: qty 10

## 2018-02-12 MED ORDER — CARVEDILOL 25 MG PO TABS: 25.0000 mg | ORAL_TABLET | Freq: Two times a day (BID) | ORAL | Status: DC

## 2018-02-12 MED ORDER — CARVEDILOL 6.25 MG PO TABS
3.1250 mg | ORAL_TABLET | Freq: Two times a day (BID) | ORAL | Status: DC
  Administered 2018-02-13 (×2): 3.125 mg via ORAL
  Filled 2018-02-12: qty 0.5
  Filled 2018-02-12: qty 1
  Filled 2018-02-12: qty 0.5
  Filled 2018-02-12: qty 1
  Filled 2018-02-12 (×2): qty 0.5

## 2018-02-12 MED ORDER — ONDANSETRON HCL 4 MG/2ML IJ SOLN
4.0000 mg | Freq: Four times a day (QID) | INTRAMUSCULAR | Status: DC | PRN
  Administered 2018-02-14: 4 mg via INTRAVENOUS
  Filled 2018-02-12: qty 2

## 2018-02-12 MED ORDER — ASPIRIN EC 81 MG PO TBEC
81.0000 mg | DELAYED_RELEASE_TABLET | Freq: Every day | ORAL | Status: DC
  Administered 2018-02-13 – 2018-02-14 (×2): 81 mg via ORAL
  Filled 2018-02-12 (×3): qty 1

## 2018-02-12 MED ORDER — SODIUM CHLORIDE 0.9 % IV SOLN
INTRAVENOUS | Status: DC
  Administered 2018-02-12 – 2018-02-14 (×2): via INTRAVENOUS

## 2018-02-12 MED ORDER — QUETIAPINE FUMARATE 25 MG PO TABS
25.0000 mg | ORAL_TABLET | Freq: Every evening | ORAL | Status: DC | PRN
  Administered 2018-02-12 – 2018-02-14 (×3): 25 mg via ORAL
  Filled 2018-02-12 (×3): qty 1

## 2018-02-12 MED ORDER — GADOBUTROL 1 MMOL/ML IV SOLN
8.0000 mL | Freq: Once | INTRAVENOUS | Status: AC | PRN
  Administered 2018-02-12: 8 mL via INTRAVENOUS

## 2018-02-12 MED ORDER — ACETAMINOPHEN 650 MG RE SUPP: 650.0000 mg | Freq: Four times a day (QID) | RECTAL | Status: DC | PRN

## 2018-02-12 MED ORDER — BISACODYL 5 MG PO TBEC: 5.0000 mg | DELAYED_RELEASE_TABLET | Freq: Every day | ORAL | Status: DC | PRN

## 2018-02-12 MED ORDER — ONDANSETRON HCL 4 MG PO TABS: 4.0000 mg | ORAL_TABLET | Freq: Four times a day (QID) | ORAL | Status: DC | PRN

## 2018-02-12 MED ORDER — LORATADINE 10 MG PO TABS
10.0000 mg | ORAL_TABLET | Freq: Every day | ORAL | Status: DC
  Administered 2018-02-13 – 2018-02-14 (×2): 10 mg via ORAL
  Filled 2018-02-12 (×3): qty 1

## 2018-02-12 NOTE — ED Notes (Signed)
Patient transported to MRI 

## 2018-02-12 NOTE — ED Provider Notes (Signed)
[facility] Emergency Department Provider Note  ____________________________________________   I have reviewed the triage vital signs and the nursing notes.   HISTORY  Chief Complaint Decreased urination  History limited by: Not Limited   HPI Phillip Sanchez is a 84 y.o. male who presents to the emergency department today accompanied by family because of concerns for decreased urination.  The family states that they stayed with him last night.  Every 15 or 20 minutes he would get up to try to use the bathroom.  He would be unable to use the bathroom.  He is having some lower abdominal pain.  The patient denies any painful urination.  Denies any fevers.  He has been seen recently for a fall.  States he does feel like his legs are numb.   Per medical record review patient has a history of CHF, HLD, HTN, lung cancer  Past Medical History:  Diagnosis Date  . Anemia   . CHF (congestive heart failure) ([place])   . Colon cancer [facility])    He states they removed cancerous polyps.  . Hypercholesteremia   . Hypertension   . Lung cancer ([place])   . Myocardial infarction ([place])   . Osteoarthritis   . Prostate cancer [facility])    Prostatectomy.  . 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 Dysuria 12/14/2017  . Chronic systolic heart failure ([place]) 09/22/2017  . HTN (hypertension) 09/22/2017  . Acute diastolic CHF (congestive heart failure) ([place]) 09/09/2017  . Small cell lung cancer ([place]) 08/04/2017  . Liver masses   . Goals of care, counseling/discussion   . Palliative care by specialist   . CAP (community acquired pneumonia) 07/26/2017  . Liver metastases ([place]) 07/19/2017  . Cancer of lower lobe of left lung ([place]) 01/16/2016    Past Surgical History:  Procedure Laterality Date  . COLON SURGERY    . CORONARY ANGIOPLASTY WITH STENT PLACEMENT    . herniorrhaphy    . PORTA CATH INSERTION N/A 08/05/2017   Procedure: PORTA CATH INSERTION;  Surgeon: Katha Cabal, MD;  Location: [place] [facility];  Service: Cardiovascular;  Laterality: N/A;    Prior to Admission medications   Medication Sig Start Date End Date Taking? Authorizing Provider  amLODipine-benazepril (LOTREL) 10-40 MG capsule Take 1 capsule by mouth daily.    [provider]  aspirin EC 81 MG tablet Take 81 mg by mouth daily.     [provider]  atorvastatin (LIPITOR) 20 MG tablet Take 20 mg by mouth at bedtime.     [provider]  calcium carbonate (OS-CAL - DOSED IN MG OF ELEMENTAL CALCIUM) 1250 (500 Ca) MG tablet Take 1 tablet by mouth daily.    [provider]  carvedilol (COREG) 25 MG tablet Take 25 mg by mouth 2 (two) times daily.     [provider]  cetirizine (ZYRTEC) 10 MG tablet Take 1 tablet by mouth daily.    [provider]  clopidogrel (PLAVIX) 75 MG tablet Take 75 mg by mouth daily.     [provider]  dexamethasone (DECADRON) 4 MG tablet Take 1 tablet (4 mg total) by mouth daily. 01/17/18   Noreene Filbert, MD  docusate sodium (COLACE) 100 MG capsule Take 1 capsule (100 mg total) by mouth daily as needed. Patient not taking: Reported on 01/04/2018 07/18/17 07/18/18  Schuyler Amor, MD  esomeprazole (NEXIUM) 20 MG capsule Take  1 capsule (20 mg total) by mouth daily. 08/09/17   Verlon Au, NP  fexofenadine-pseudoephedrine (ALLEGRA-D 24) 180-240 MG per 24 hr tablet Take 1 tablet by mouth daily as needed (for allergies).     [provider]  fluticasone (FLONASE) 50 MCG/ACT nasal spray Place 1 spray into the nose daily as needed for allergies or rhinitis.     [provider]  Fluticasone-Salmeterol (ADVAIR DISKUS) 500-50 MCG/DOSE AEPB Inhale 1 puff into the lungs 2 (two) times daily. Patient not taking: Reported on 01/04/2018 09/07/17   Cammie Sickle, MD  furosemide (LASIX) 20 MG tablet Take 1 tablet (20 mg total) by mouth 2  (two) times daily. Patient taking differently: Take 20 mg by mouth daily.  09/16/17   Cammie Sickle, MD  Multiple Vitamin (MULTI-VITAMINS) TABS Take 1 tablet by mouth daily.     [provider]  Oxycodone HCl 10 MG TABS Take 1 tablet (10 mg total) by mouth every 6 (six) hours as needed. Patient not taking: Reported on 01/04/2018 09/05/17   Earlie Server, MD  polyethylene glycol [facility] Floria Raveling) packet Take 17 g by mouth daily. Patient not taking: Reported on 01/25/2018 08/09/17   Verlon Au, NP  potassium chloride 20 MEQ TBCR Take 20 mEq by mouth daily. 09/16/17   Cammie Sickle, MD  prochlorperazine (COMPAZINE) 10 MG tablet Take 1 tablet (10 mg total) by mouth every 6 (six) hours as needed for nausea or vomiting. Patient not taking: Reported on 01/04/2018 08/10/17   Cammie Sickle, MD  ranitidine (ZANTAC) 300 MG tablet Take 300 mg by mouth at bedtime.    [provider]  rOPINIRole (REQUIP) 1 MG tablet Take 1 mg by mouth at bedtime.     [provider]  senna (SENOKOT) 8.6 MG TABS tablet Take 1 tablet (8.6 mg total) by mouth daily. Patient not taking: Reported on 01/04/2018 08/09/17   Verlon Au, NP  temazepam (RESTORIL) 15 MG capsule Take 15-30 mg by mouth at bedtime as needed for sleep.     [provider]    Allergies Patient has no known allergies.  Family History  Problem Relation Age of Onset  . Cancer Brother 7       unknown orgin  . Cancer Sister 39       unknown type    Social History Social History   Tobacco Use  . Smoking status: Former Smoker    Packs/day: 1.00    Years: 36.00    Pack years: 36.00    Types: Cigarettes    Last attempt to quit: 1987    Years since quitting: 33.0  . Smokeless tobacco: Never Used  Substance Use Topics  . Alcohol use: No    Alcohol/week: 0.0 standard drinks  . Drug use: No    Review of Systems Constitutional: No fever/chills Eyes: No visual changes. ENT: No sore  throat. Cardiovascular: Denies chest pain. Respiratory: Denies shortness of breath. Gastrointestinal: No abdominal pain.  No nausea, no vomiting.  No diarrhea.   Genitourinary: Positive for decreased urination Musculoskeletal: Negative for back pain. Skin: Negative for rash. Neurological: Negative for headaches, focal weakness or numbness.  ____________________________________________   PHYSICAL EXAM:  VITAL SIGNS: ED Triage Vitals  Enc Vitals Group     BP 01/25/2018 1438 (!) 139/47     Pulse Rate 01/20/2018 1438 79     Resp 02/13/2018 1438 18     Temp 01/28/2018 1438 98.3 F (36.8 C)  Temp Source 01/26/2018 1438 Oral     SpO2 01/17/2018 1438 94 %     Weight 01/29/2018 1439 169 lb 12.1 oz (77 kg)     Height 02/09/2018 1439 5\' 10"  (1.778 m)     Head Circumference --      Peak Flow --      Pain Score 01/15/2018 1439 0   Constitutional: Alert and oriented.  Eyes: Conjunctivae are normal.  ENT      Head: Normocephalic. Periorbital bruising and swelling to the left eye      Nose: No congestion/rhinnorhea.      Mouth/Throat: Mucous membranes are moist.      Neck: No stridor. Hematological/Lymphatic/Immunilogical: No cervical lymphadenopathy. Cardiovascular: Normal rate, regular rhythm.  No murmurs, rubs, or gallops.  Respiratory: Normal respiratory effort without tachypnea nor retractions. Breath sounds are clear and equal bilaterally. No wheezes/rales/rhonchi. Gastrointestinal: Soft and minimal tenderness in the suprapubic region. No rebound. No guarding.  Genitourinary: Deferred Musculoskeletal: Normal range of motion in all extremities. No lower extremity edema. Neurologic:  Normal speech and language. No gross focal neurologic deficits are appreciated.  Skin:  Contution noted to left thorax Psychiatric: Mood and affect are normal. Speech and behavior are normal. Patient exhibits appropriate insight and judgment.  ____________________________________________    LABS (pertinent  positives/negatives)  UA cloudy, moderate hgb dipstick, large leukocytes, >50 WBC CBC wbc 12.5, hgb 8.8, plt 148 BMP na 139, k 3.6, glu 112, cr 1.13, ca 8.4  ____________________________________________   EKG  None  ____________________________________________    RADIOLOGY  None  ____________________________________________   PROCEDURES  Procedures  ____________________________________________   INITIAL IMPRESSION / ASSESSMENT AND PLAN / ED COURSE  Pertinent labs & imaging results that were available during my care of the patient were reviewed by me and considered in my medical decision making (see chart for details).   Patient presented because of concerns for decreased urination. On exam patient with some tenderness in the suprapubic region. No distention noted. Concern for UTI v retention. UA is consistent with UTI. Given severe weakness will plan on IV abx and admission. Discussed findings with patient and family. ____________________________________________   FINAL CLINICAL IMPRESSION(S) / ED DIAGNOSES  Final diagnoses:  Weakness  Lower urinary tract infection     Note: This dictation was prepared with Dragon dictation. Any transcriptional errors that result from this process are unintentional     Nance Pear, MD 01/24/2018 1705

## 2018-02-12 NOTE — Progress Notes (Signed)
Vashti Hey, MD per private chat about BP 137/50 and ordered coreg. MD ok to hold med tonight.

## 2018-02-12 NOTE — ED Triage Notes (Signed)
Pt from home via ems- family reports pt has had multiple falls in the past couple weeks with last one Thursday, pt was seen afterwards here in ED. Per family pt has not been eating well.

## 2018-02-12 NOTE — H&P (Signed)
[place] [facility] [place] NAME: Phillip Sanchez    MR#:  [id number]  DATE OF BIRTH:  April 11, 1927  DATE OF ADMISSION:  01/25/2018  PRIMARY CARE PHYSICIAN: Idelle Crouch, MD   REQUESTING/REFERRING PHYSICIAN: Dr Consuelo Pandy  CHIEF COMPLAINT:   Chief Complaint  Patient presents with  . Failure To Thrive    HISTORY OF PRESENT ILLNESS:  Phillip Sanchez  is a 84 y.o. 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  The patient has had multiple falls over the past few weeks.  He states that his balance is been off.  He has a hematoma over his left eye and left side of his body.  He recently had fractured ribs from a fall.  Last night he did not sleep at all he was trying to urinate and only very little came out each time.  He was diagnosed in the ER with a urinary tract infection and hospitalist services were contacted for further evaluation.  The patient was sleeping a lot when I was in the room.  Family states that normally he is very coherent and is able to follow commands.  PAST MEDICAL HISTORY:   Past Medical History:  Diagnosis Date  . Anemia   . CHF (congestive heart failure) ([place])   . Colon cancer [facility])    He states they removed cancerous polyps.  . Hypercholesteremia   . Hypertension   . Lung cancer ([place])   . Myocardial infarction ([place])   . Osteoarthritis   . Prostate cancer [facility])    Prostatectomy.  . 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 COLON SURGERY    . CORONARY ANGIOPLASTY WITH STENT PLACEMENT    . herniorrhaphy    . PORTA CATH INSERTION N/A 08/05/2017   Procedure: PORTA CATH INSERTION;  Surgeon: Katha Cabal, MD;  Location: [place] [facility] LAB;  Service: Cardiovascular;  Laterality: N/A;  . prostactectomy      SOCIAL HISTORY:   Social History   Tobacco Use  . Smoking status: Former Smoker     Packs/day: 1.00    Years: 36.00    Pack years: 36.00    Types: Cigarettes    Last attempt to quit: 1987    Years since quitting: 33.0  . Smokeless tobacco: Never Used  Substance Use Topics  . Alcohol use: No    Alcohol/week: 0.0 standard drinks    FAMILY HISTORY:   Family History  Problem Relation Age of Onset  . Cancer Brother 47       unknown orgin  . Cancer Sister 24       unknown type  . CAD Father     DRUG ALLERGIES:  No Known Allergies  REVIEW OF SYSTEMS:  CONSTITUTIONAL: No fever, chills or sweats.  Positive for weight loss.  Positive for fatigue.  EYES: No blurred or double vision.  EARS, NOSE, AND THROAT: No tinnitus or ear pain. No sore throat.  Wears hearing aids. RESPIRATORY: No cough, shortness of breath, wheezing or hemoptysis.  CARDIOVASCULAR: No chest pain, orthopnea, edema.  GASTROINTESTINAL: No nausea, vomiting, diarrhea.  Positive for constipation and some abdominal pain. No blood in bowel movements GENITOURINARY: Positive for dysuria and trouble getting his urine out ENDOCRINE: No polyuria, nocturia,  HEMATOLOGY: Positive for anemia and hematoma left thigh and left side SKIN: Large hematoma around left thigh  and left flank. MUSCULOSKELETAL: Positive rib pain NEUROLOGIC: Numbness left leg PSYCHIATRY: No anxiety or depression.   MEDICATIONS AT HOME:   Prior to Admission medications   Medication Sig Start Date End Date Taking? Authorizing Provider  amLODipine-benazepril (LOTREL) 10-40 MG capsule Take 1 capsule by mouth daily.   Yes [provider]  aspirin EC 81 MG tablet Take 81 mg by mouth daily.    Yes [provider]  carvedilol (COREG) 25 MG tablet Take 25 mg by mouth 2 (two) times daily.    Yes [provider]  cetirizine (ZYRTEC) 10 MG tablet Take 1 tablet by mouth daily.   Yes [provider]  fexofenadine-pseudoephedrine (ALLEGRA-D 24) 180-240 MG per 24 hr tablet Take 1 tablet by mouth daily as needed (for  allergies).    Yes [provider]  furosemide (LASIX) 20 MG tablet Take 1 tablet (20 mg total) by mouth 2 (two) times daily. Patient taking differently: Take 20 mg by mouth daily.  09/16/17  Yes Cammie Sickle, MD  polyethylene glycol (MIRALAX / GLYCOLAX) packet Take 17 g by mouth daily. 08/09/17  Yes Verlon Au, NP  potassium chloride 20 MEQ TBCR Take 20 mEq by mouth daily. 09/16/17  Yes Cammie Sickle, MD      VITAL SIGNS:  Blood pressure (!) 139/47, pulse 79, temperature 98.3 F (36.8 C), temperature source Oral, resp. rate 18, height 5\' 10"  (1.778 m), weight 77 kg, SpO2 94 %.  PHYSICAL EXAMINATION:  GENERAL:  84 y.o.-year-old patient lying in the bed with no acute distress.  EYES: Pupils equal, round, reactive to light and accommodation. No scleral icterus. Extraocular muscles intact.  HEENT: Oropharynx and nasopharynx clear.  NECK:  Supple, no jugular venous distention. No thyroid enlargement, no tenderness.  LUNGS: Normal breath sounds bilaterally, no wheezing, rales,rhonchi or crepitation. No use of accessory muscles of respiration.  CARDIOVASCULAR: S1, S2 normal. No murmurs, rubs, or gallops.  ABDOMEN: Soft, nontender, nondistended. Bowel sounds present. No organomegaly or mass.  EXTREMITIES: 2+ pedal edema, no cyanosis, or clubbing.  NEUROLOGIC: Cranial nerves II through XII are intact. Muscle strength 4/5 in all extremities. Sensation intact. Gait not checked.  PSYCHIATRIC: The patient is alert and oriented x 3.  SKIN: Large hematoma around left eye and left flank  LABORATORY PANEL:   CBC Recent Labs  Lab 02/01/2018 1449  WBC 12.5*  HGB 8.8*  HCT 27.3*  PLT 148*   ------------------------------------------------------------------------------------------------------------------  Chemistries  Recent Labs  Lab 02/09/18 0948 02/03/2018 1449  NA 141 139  K 3.5 3.6  CL 109 106  CO2 24 21*  GLUCOSE 107* 112*  BUN 44* 33*  CREATININE 1.20 1.13   CALCIUM 8.6* 8.4*  AST 64*  --   ALT 40  --   ALKPHOS 75  --   BILITOT 1.0  --    ------------------------------------------------------------------------------------------------------------------  Cardiac Enzymes Recent Labs  Lab 02/09/18 0948  TROPONINI 0.05*   ------------------------------------------------------------------------------------------------------------------    EKG:   Ordered by me  IMPRESSION AND PLAN:   1.  Acute metabolic encephalopathy.  Likely from acute cystitis.  Rocephin and urine culture ordered.  Could also be from metastatic small cell lung cancer to the brain.  I will repeat an MRI of the brain and get oncology consultation.  Patient finished radiation to the brain. 2.  Small cell lung cancer metastatic to liver and the brain.  Overall prognosis is poor.  Patient wishes to be a full code. 3.  Elevated troponin  likely demand ischemia. 4.  Hypertension.  Decrease dose of Coreg and hold Lotrel. 5.  Last EKG with bradycardia and heart block.  Decrease dose of Coreg and monitor on off unit telemetry   All the records are reviewed and case discussed with ED provider. Management plans discussed with the patient, family and they are in agreement.  CODE STATUS: full code  TOTAL TIME TAKING CARE OF THIS PATIENT: 50 minutes, including acp time.    Loletha Grayer M.D on 01/19/2018 at 5:44 PM  Between 7am to 6pm - Pager - [phone]  After 6pm call admission pager [phone]  [facility] Office  [phone]  CC: Primary care physician; Idelle Crouch, MD

## 2018-02-12 NOTE — Progress Notes (Signed)
Patient ID: Phillip Sanchez, male   DOB: 11/03/1927, 84 y.o.   MRN: [id number]  ACP note  Patient and sons at the bedside  Diagnosis: Acute metabolic encephalopathy, acute cystitis, small cell lung cancer metastatic to liver and brain, hypertension, elevated troponin  CODE STATUS discussed and patient wishes to be a full code  Plan.  Treat acute cystitis with Rocephin and send off urine culture.  Repeat MRI of the brain and get oncology consultation for metastatic lung cancer to the brain.  Overall prognosis is poor.  Family knows this also.  Time spent on ACP discussion 17 minutes Dr. Loletha Grayer

## 2018-02-13 ENCOUNTER — Other Ambulatory Visit: Payer: Self-pay

## 2018-02-13 DIAGNOSIS — I509 Heart failure, unspecified: Secondary | ICD-10-CM

## 2018-02-13 DIAGNOSIS — G9341 Metabolic encephalopathy: Secondary | ICD-10-CM

## 2018-02-13 DIAGNOSIS — Z515 Encounter for palliative care: Secondary | ICD-10-CM

## 2018-02-13 DIAGNOSIS — Z85038 Personal history of other malignant neoplasm of large intestine: Secondary | ICD-10-CM

## 2018-02-13 DIAGNOSIS — C7931 Secondary malignant neoplasm of brain: Secondary | ICD-10-CM

## 2018-02-13 DIAGNOSIS — Z8546 Personal history of malignant neoplasm of prostate: Secondary | ICD-10-CM

## 2018-02-13 DIAGNOSIS — Z9181 History of falling: Secondary | ICD-10-CM

## 2018-02-13 DIAGNOSIS — E440 Moderate protein-calorie malnutrition: Secondary | ICD-10-CM

## 2018-02-13 DIAGNOSIS — C3432 Malignant neoplasm of lower lobe, left bronchus or lung: Secondary | ICD-10-CM

## 2018-02-13 DIAGNOSIS — J449 Chronic obstructive pulmonary disease, unspecified: Secondary | ICD-10-CM

## 2018-02-13 DIAGNOSIS — C787 Secondary malignant neoplasm of liver and intrahepatic bile duct: Secondary | ICD-10-CM

## 2018-02-13 DIAGNOSIS — Z87891 Personal history of nicotine dependence: Secondary | ICD-10-CM

## 2018-02-13 DIAGNOSIS — R531 Weakness: Secondary | ICD-10-CM

## 2018-02-13 LAB — BASIC METABOLIC PANEL
Anion gap: 12 (ref 5–15)
BUN: 36 mg/dL — ABNORMAL HIGH (ref 8–23)
CALCIUM: 8.2 mg/dL — AB (ref 8.9–10.3)
CO2: 21 mmol/L — ABNORMAL LOW (ref 22–32)
CREATININE: 1.18 mg/dL (ref 0.61–1.24)
Chloride: 106 mmol/L (ref 98–111)
GFR calc Af Amer: >60 mL/min (ref >60)
GFR calc non Af Amer: 55 mL/min — ABNORMAL LOW (ref >60)
Glucose, Bld: 124 mg/dL — ABNORMAL HIGH (ref 70–99)
Potassium: 3.7 mmol/L (ref 3.5–5.1)
Sodium: 139 mmol/L (ref 135–145)

## 2018-02-13 LAB — CBC
HCT: 26.7 % — ABNORMAL LOW (ref 39.0–52.0)
Hemoglobin: 8.4 g/dL — ABNORMAL LOW (ref 13.0–17.0)
MCH: 34.1 pg — ABNORMAL HIGH (ref 26.0–34.0)
MCHC: 31.5 g/dL (ref 30.0–36.0)
MCV: 108.5 fL — ABNORMAL HIGH (ref 80.0–100.0)
PLATELETS: 138 10*3/uL — AB (ref 150–400)
RBC: 2.46 MIL/uL — ABNORMAL LOW (ref 4.22–5.81)
RDW: 14.8 % (ref 11.5–15.5)
WBC: 13.2 10*3/uL — ABNORMAL HIGH (ref 4.0–10.5)
nRBC: 0 % (ref 0.0–0.2)

## 2018-02-13 MED ORDER — ENSURE ENLIVE PO LIQD
237.0000 mL | Freq: Two times a day (BID) | ORAL | Status: DC
  Administered 2018-02-14 (×2): 237 mL via ORAL

## 2018-02-13 MED ORDER — OXYCODONE HCL 5 MG PO TABS
5.0000 mg | ORAL_TABLET | ORAL | Status: DC | PRN
  Administered 2018-02-14 (×2): 5 mg via ORAL
  Filled 2018-02-13 (×3): qty 1

## 2018-02-13 MED ORDER — ORAL CARE MOUTH RINSE
15.0000 mL | Freq: Two times a day (BID) | OROMUCOSAL | Status: DC
  Administered 2018-02-13: 23:00:00 15 mL via OROMUCOSAL

## 2018-02-13 NOTE — Clinical Social Work Note (Signed)
Clinical Social Work Assessment  Patient Details  Name: Phillip Sanchez MRN: [id number] Date of Birth: October 03, 1927  Date of referral:  02/13/18               Reason for consult:  Facility Placement                Permission sought to share information with:  Chartered certified accountant granted to share information::  Yes, Verbal Permission Granted  Name::      [place]::   [place]   Relationship::     Contact Information:     Housing/Transportation Living arrangements for the past 2 months:  [place] of Information:  Adult Children Patient Interpreter Needed:  None Criminal Activity/Legal Involvement Pertinent to Current Situation/Hospitalization:  No - Comment as needed Significant Relationships:  Adult Children, Spouse Lives with:  Spouse Do you feel safe going back to the place where you live?  Yes Need for family participation in patient care:  Yes (Comment)  Care giving concerns:  Patient lives in [place] with his wife Phillip Sanchez.    Social Worker assessment / plan:  Holiday representative (CSW) received SNF consult. PT is recommending SNF. Per palliative NP note today plan is to see how patient progresses in the next few days to determine a SNF or hospice disposition. CSW attempted to meet with patient however he was not alert and oriented and no family was at bedside. CSW contacted patient's son Phillip Sanchez. Per son patient's wife has dementia and he is the main contact for patient. Per son patient has finished chemo and radiation and has an appointment at the cancer center on Jan. 8th to determine the plan. CSW explained SNF process and that [facility] will have to approve SNF. Son is agreeable to SNF search in [place]. Son confirmed the plan is to watch patient for the next few days to determine if he needs SNF or hospice. CSW will continue to follow and assist as needed.   Employment status:  Disabled (Comment on whether or  not currently receiving Disability) Insurance information:  Managed Medicare PT Recommendations:  [place] / Referral to community resources:  [place]  Patient/Family's Response to care:  Patient's son is agreeable to [facility] in [place].   Patient/Family's Understanding of and Emotional Response to Diagnosis, Current Treatment, and Prognosis:  Patient's son was very pleasant and thanked CSW for assistance.   Emotional Assessment Appearance:  Appears stated age Attitude/Demeanor/Rapport:  Unable to Assess Affect (typically observed):  Unable to Assess Orientation:  Oriented to Self, Fluctuating Orientation (Suspected and/or reported Sundowners) Alcohol / Substance use:  Not Applicable Psych involvement (Current and /or in the community):  No (Comment)  Discharge Needs  Concerns to be addressed:  Discharge Planning Concerns Readmission within the last 30 days:  No Current discharge risk:  Dependent with Mobility, Cognitively Impaired, Chronically ill Barriers to Discharge:  Continued Medical Work up   [employer], Veronia Beets, LCSW 02/13/2018, 4:34 PM

## 2018-02-13 NOTE — Progress Notes (Signed)
Initial Nutrition Assessment  DOCUMENTATION CODES:   Non-severe (moderate) malnutrition in context of chronic illness  INTERVENTION:  Recommend liberalizing diet to regular.  Provide Ensure Enlive po BID, each supplement provides 350 kcal and 20 grams of protein.  NUTRITION DIAGNOSIS:   Moderate Malnutrition related to chronic illness(small cell lung cancer with brain mets, CHF) as evidenced by moderate fat depletion, moderate muscle depletion.  GOAL:   Patient will meet greater than or equal to 90% of their needs  MONITOR:   PO intake, Supplement acceptance, Labs, Weight trends, I & O's  REASON FOR ASSESSMENT:   Malnutrition Screening Tool, Consult Assessment of nutrition requirement/status  ASSESSMENT:   84 year old male with PMHx of HTN, CHF (LV EF 40-45%), hx MI, anemia, OA, prostate cancer s/p prostatectomy, small cell lung cancer with mets to brain on whole brain XRT admitted with acute metabolic encephalopathy.   Met with patient at bedside. He is a little confused and no family members present at time of RD assessment. Patient endorses his appetite is decreased but unable to provide any further details. His lunch tray at bedside had not been touched and he reports he is not hungry. Unsure if he had any breakfast this AM. Per chart it appears PO intake has been poor. Patient reports he enjoys Ensure.  Patient reports he has been losing weight but he is unsure of weight trend. Patient was 195 lbs on 01/20/2017. He is currently 77 kg (169.75 lbs) if that was a true weigt. He has lost 25.25 lbs (12.9% body weight) over the past year, which is not significant for time frame.  Medications reviewed and include: NS @ 40 mL/hr, ceftriaxone.  Labs reviewed: CO2 21, BUN 36.  Discussed with RN.  NUTRITION - FOCUSED PHYSICAL EXAM:    Most Recent Value  Orbital Region  Moderate depletion  Upper Arm Region  Severe depletion  Thoracic and Lumbar Region  Moderate depletion   Buccal Region  Moderate depletion  Temple Region  Moderate depletion  Clavicle Bone Region  Moderate depletion  Clavicle and Acromion Bone Region  Moderate depletion  Scapular Bone Region  Moderate depletion  Dorsal Hand  Moderate depletion  Patellar Region  Severe depletion  Anterior Thigh Region  Severe depletion  Posterior Calf Region  Moderate depletion  Edema (RD Assessment)  Mild  Hair  Reviewed  Eyes  Reviewed  Mouth  Unable to assess  Skin  Reviewed [ecchymosis]  Nails  Reviewed     Diet Order:   Diet Order            Diet Heart Room service appropriate? Yes; Fluid consistency: Thin  Diet effective now             EDUCATION NEEDS:   Not appropriate for education at this time  Skin:  Skin Assessment: Reviewed RN Assessment(ecchymosis)  Last BM:  02/13/2018 - small type 6  Height:   Ht Readings from Last 1 Encounters:  02/07/2018 '5\' 10"'  (1.778 m)   Weight:   Wt Readings from Last 1 Encounters:  02/01/2018 77 kg   Ideal Body Weight:  75.5 kg  BMI:  Body mass index is 24.36 kg/m.  Estimated Nutritional Needs:   Kcal:  1890-2180 (MSJ x 1.3-1.5)  Protein:  90-115 grams (1.2-1.5 grams/kg)  Fluid:  1.8-2.1 L/day  Willey Blade, MS, RD, LDN Office: [phone] Pager: [phone] After Hours/Weekend Pager: [phone]

## 2018-02-13 NOTE — Plan of Care (Signed)

## 2018-02-13 NOTE — Consult Note (Signed)
[facility] NOTE  Patient Care Team: Idelle Crouch, MD as PCP - General (Internal Medicine) Telford Nab, RN as Registered Nurse Fath, Javier Docker, MD as Consulting Physician (Cardiology) Cammie Sickle, MD as Medical Oncologist (Medical Oncology)  CHIEF COMPLAINTS/PURPOSE OF CONSULTATION:  Small cell lung cancer/multiple falls generalized weakness  HISTORY OF PRESENTING ILLNESS:  Phillip Sanchez 84 y.o.  male with a history of multiple medical problems including COPD CHF and metastatic small cell lung cancer currently on Tecentriq immunotherapy is currently admitted the hospital for generalized weakness multiple falls.  Patient more recently had whole brain radiation for multiple brain metastasis.  Patient at baseline is fairly independent at home.  More recently patient was last few days noted to have worsening generalized weakness; and having multiple falls at least 2 " big ones" as per family.   On admission to the hospital patient noted to have urinary suggestive of infection.  He is also currently on antibiotics.  Patient had MRI the brain which was motion degraded-however noted to have improvement of his metastatic brain lesions [status post radiation].  As per the family patient continues to be confused.   Review of Systems  Unable to perform ROS: Mental status change     MEDICAL HISTORY:  Past Medical History:  Diagnosis Date  . Anemia   . CHF (congestive heart failure) ([place])   . Colon cancer [facility])    He states they removed cancerous polyps.  . Hypercholesteremia   . Hypertension   . Lung cancer ([place])   . Myocardial infarction ([place])   . Osteoarthritis   . Prostate cancer [facility])    Prostatectomy.  . Small cell lung cancer ([place]) 08/04/2017    SURGICAL HISTORY: Past Surgical History:  Procedure Laterality Date  . COLON SURGERY    . CORONARY ANGIOPLASTY WITH STENT PLACEMENT    . herniorrhaphy    . PORTA CATH INSERTION N/A 08/05/2017    Procedure: PORTA CATH INSERTION;  Surgeon: Katha Cabal, MD;  Location: [facility];  Service: Cardiovascular;  Laterality: N/A;  . prostactectomy      SOCIAL HISTORY: Social History   Socioeconomic History  . Marital status: Married    Spouse name: Not on file  . Number of children: Not on file  . Years of education: Not on file  . Highest education level: Not on file  Occupational History  . Not on file  Social Needs  . Financial resource strain: Not on file  . Food insecurity:    Worry: Not on file    Inability: Not on file  . Transportation needs:    Medical: Not on file    Non-medical: Not on file  Tobacco Use  . Smoking status: Former Smoker    Packs/day: 1.00    Years: 36.00    Pack years: 36.00    Types: Cigarettes    Last attempt to quit: 1987    Years since quitting: 33.0  . Smokeless tobacco: Never Used  Substance and Sexual Activity  . Alcohol use: No    Alcohol/week: 0.0 standard drinks  . Drug use: No  . Sexual activity: Not Currently  Lifestyle  . Physical activity:    Days per week: Not on file    Minutes per session: Not on file  . Stress: Not on file  Relationships  . Social connections:    Talks on phone: Not on file    Gets together: Not on file  Attends religious service: Not on file    Active member of club or organization: Not on file    Attends meetings of clubs or organizations: Not on file    Relationship status: Not on file  . Intimate partner violence:    Fear of current or ex partner: Not on file    Emotionally abused: Not on file    Physically abused: Not on file    Forced sexual activity: Not on file  Other Topics Concern  . Not on file  Social History Narrative  . Not on file    FAMILY HISTORY: Family History  Problem Relation Age of Onset  . Cancer Brother 82       unknown orgin  . Cancer Sister 67       unknown type  . CAD Father     ALLERGIES:  has No Known Allergies.  MEDICATIONS:  Current  Facility-Administered Medications  Medication Dose Route Frequency Provider Last Rate Last Dose  . 0.9 %  sodium chloride infusion   Intravenous Continuous Loletha Grayer, MD 40 mL/hr at 01/20/2018 2038    . acetaminophen (TYLENOL) tablet 650 mg  650 mg Oral Q6H PRN Loletha Grayer, MD   650 mg at 02/13/18 1008   Or  . acetaminophen (TYLENOL) suppository 650 mg  650 mg Rectal Q6H PRN Loletha Grayer, MD      . aspirin EC tablet 81 mg  81 mg Oral Daily Loletha Grayer, MD   81 mg at 02/13/18 1009  . bisacodyl (DULCOLAX) EC tablet 5 mg  5 mg Oral Daily PRN Loletha Grayer, MD      . carvedilol (COREG) tablet 3.125 mg  3.125 mg Oral BID WC Loletha Grayer, MD   3.125 mg at 02/13/18 1835  . cefTRIAXone (ROCEPHIN) 1 g in sodium chloride 0.9 % 100 mL IVPB  1 g Intravenous Q24H Loletha Grayer, MD 200 mL/hr at 02/13/18 1826 1 g at 02/13/18 1826  . [START ON 02/14/2018] feeding supplement (ENSURE ENLIVE) (ENSURE ENLIVE) liquid 237 mL  237 mL Oral BID BM Demetrios Loll, MD      . loratadine (CLARITIN) tablet 10 mg  10 mg Oral Daily Loletha Grayer, MD   10 mg at 02/13/18 1009  . MEDLINE mouth rinse  15 mL Mouth Rinse BID Leslye Peer, Richard, MD      . ondansetron [facility]) tablet 4 mg  4 mg Oral Q6H PRN Loletha Grayer, MD       Or  . ondansetron (ZOFRAN) injection 4 mg  4 mg Intravenous Q6H PRN Wieting, Richard, MD      . oxyCODONE (Oxy IR/ROXICODONE) immediate release tablet 5 mg  5 mg Oral Q4H PRN Wieting, Richard, MD      . polyethylene glycol (MIRALAX / GLYCOLAX) packet 17 g  17 g Oral Daily PRN Wieting, Richard, MD      . QUEtiapine (SEROQUEL) tablet 25 mg  25 mg Oral QHS PRN Lance Coon, MD   25 mg at 02/05/2018 2344      .  PHYSICAL EXAMINATION:  Vitals:   02/13/18 0427 02/13/18 1844  BP: (!) 151/56 (!) 145/48  Pulse: 100 60  Resp: 18   Temp: 99.7 F (37.6 C) (!) 97.5 F (36.4 C)  SpO2: 91% 95%   Filed Weights   01/20/2018 1439  Weight: 169 lb 12.1 oz (77 kg)    Physical Exam   Constitutional: He is well-developed, well-nourished, and in no distress.  Frail-appearing Caucasian male patient.  Is confused.He is  accompanied by his family.  HENT:  Head: Normocephalic and atraumatic.  Mouth/Throat: Oropharynx is clear and moist. No oropharyngeal exudate.  Eyes: Pupils are equal, round, and reactive to light.  Neck: Normal range of motion. Neck supple.  Cardiovascular: Normal rate and regular rhythm.  Pulmonary/Chest: No respiratory distress. He has no wheezes.  Abdominal: Soft. Bowel sounds are normal. He exhibits no distension and no mass. There is no abdominal tenderness. There is no rebound and no guarding.  Musculoskeletal: Normal range of motion.        General: No tenderness or edema.  Neurological: He is alert.  Patient is confused.  Oriented x0-1.  Skin: Skin is warm.  Laceration noted left orbital area.  Psychiatric: Affect normal.     LABORATORY DATA:  I have reviewed the data as listed Lab Results  Component Value Date   WBC 13.2 (H) 02/13/2018   HGB 8.4 (L) 02/13/2018   HCT 26.7 (L) 02/13/2018   MCV 108.5 (H) 02/13/2018   PLT 138 (L) 02/13/2018   Recent Labs    01/04/18 0900 01/25/18 1023 02/09/18 0948 02/09/2018 1449 02/13/18 0344  NA 140 142 141 139 139  K 4.0 3.4* 3.5 3.6 3.7  CL 108 110 109 106 106  CO2 24 23 24  21* 21*  GLUCOSE 149* 136* 107* 112* 124*  BUN 26* 42* 44* 33* 36*  CREATININE 1.24 1.60* 1.20 1.13 1.18  CALCIUM 8.9 8.4* 8.6* 8.4* 8.2*  GFRNONAA 50* 38* 54* 58* 55*  GFRAA 58* 44* >60 >60 >60  PROT 6.7 6.3* 5.9*  --   --   ALBUMIN 3.2* 3.2* 3.1*  --   --   AST 34 43* 64*  --   --   ALT 18 20 40  --   --   ALKPHOS 59 75 75  --   --   BILITOT 0.9 0.7 1.0  --   --     RADIOGRAPHIC STUDIES: I have personally reviewed the radiological images as listed and agreed with the findings in the report. Ct Head Wo Contrast  Result Date: 02/09/2018 CLINICAL DATA:  84 year old male status post fall in bathroom overnight.  Left eye laceration and swelling. Metastatic lung cancer with numerous small brain metastases recently on November restaging MRI. EXAM: CT HEAD WITHOUT CONTRAST CT MAXILLOFACIAL WITHOUT CONTRAST TECHNIQUE: Multidetector CT imaging of the head and maxillofacial structures were performed using the standard protocol without intravenous contrast. Multiplanar CT image reconstructions of the maxillofacial structures were also generated. COMPARISON:  Brain MRI 12/31/2017 and earlier. FINDINGS: CT HEAD FINDINGS Brain: No intracranial mass effect. No vasogenic edema is evident by CT. Mild for age cerebral white matter hypodensity has increased since 2015. No acute intracranial hemorrhage identified. No ventriculomegaly. No cortically based acute infarct identified. Vascular: Calcified atherosclerosis at the skull base. No suspicious intracranial vascular hyperdensity. Skull: Stable and intact. Other: Tympanic cavities and mastoids are clear. Left periorbital findings are below. The other scalp soft tissues remain within normal limits. CT MAXILLOFACIAL FINDINGS Osseous: Small 4-5 millimeter lucent lesion along the ventral left mandible condyle is new since 2015 (series 4, image 26). Mandible otherwise intact. Supernumerary midline mandible tooth again noted. Maxilla and zygoma appear intact. Central skull base intact. Advanced cervical spine degeneration, the visible cervical spine appears stable since 2015. Orbits: Broad-based left periorbital superficial soft tissue hematoma up to 14 millimeters in thickness. The left globe remains intact. Left intraorbital soft tissues remain normal. Bilateral orbital walls remain intact. Right orbit is normal. Sinuses:  Visualized paranasal sinuses and mastoids are stable and well pneumatized. Soft tissues: Visible noncontrast larynx, pharynx, parapharyngeal spaces, retropharyngeal space, sublingual space, submandibular spaces, parotid spaces and masticator spaces are within normal limits.  No upper cervical lymphadenopathy. IMPRESSION: 1. Superficial left periorbital soft tissue hematoma without intraorbital injury. 2. No acute intracranial abnormality. Numerous small brain metastases demonstrated by MRI in November are not evident on noncontrast CT. 3. No face or skull fracture identified. Small 4-5 mm lucent lesion at the left mandible condyle is new since 2015 and could be degenerative but a small bone metastasis is not excluded. Electronically Signed   By: Genevie Ann M.D.   On: 02/09/2018 09:57   Mr Jeri Cos XH Contrast  Result Date: 02/13/2018 CLINICAL DATA:  Recent fall with confusion. History of lung cancer with brain metastases. EXAM: MRI HEAD WITHOUT AND WITH CONTRAST TECHNIQUE: Multiplanar, multiecho pulse sequences of the brain and surrounding structures were obtained without and with intravenous contrast. CONTRAST:  8 mL Gadavist COMPARISON:  Head CT 02/09/2018 and MRI 12/31/2017 FINDINGS: Due to patient confusion, there is progressive, moderate motion artifact during the examination. A sagittal T1 postcontrast sequence was not obtained. Brain: There is no evidence of acute infarct, midline shift, or extra-axial fluid collection. Mild cerebral atrophy is within normal limits for age. Patchy T2 hyperintensities in the cerebral white matter are similar to the prior MRI and nonspecific but compatible with chronic small vessel ischemic disease, relatively mild for age. Scattered foci of chronic microhemorrhage are again seen in both cerebral hemispheres and right cerebellum, at least some of which are associated with known metastases. The vast majority of the small enhancing brain metastases on the prior study are not identified on the current examination, however this may reflect both a decreased size of some of the lesions as well as obscuration of some lesions due to the extent of motion artifact. A 2 mm enhancing lesion in the inferior left cerebellar tonsil is grossly unchanged (series  15, image 4). The 13 mm right cerebellar lesion on the prior study has decreased in size with at most a punctate focus of enhancement remaining. There is susceptibility artifact associated with the left caudate lesion without definite residual enhancement. A 3 mm enhancing lesion is questioned in the right frontal lobe near the anterior aspect of the insula on the coronal series (series 16, image 21), not confirmed on the axial sequence and possibly artifactual. Right cerebellar and left occipital edema has resolved, and no new areas of significant edema are identified. Vascular: Major intracranial vascular flow voids are grossly preserved. Skull and upper cervical spine: Unremarkable bone marrow signal. Sinuses/Orbits: Unremarkable orbits. Paranasal sinuses and mastoid air cells are clear. Other: None. IMPRESSION: 1. Motion degraded examination. No evidence of acute intracranial abnormality. 2. Nonvisualization of the majority of the previously seen metastases. Some of the metastases have clearly decreased in size while others may be obscured by the motion artifact. 3. New 3 mm right frontal metastasis versus artifact. 4. No residual or new vasogenic edema. Electronically Signed   By: Logan Bores M.D.   On: 01/20/2018 19:38   Ct Maxillofacial Wo Contrast  Result Date: 02/09/2018 CLINICAL DATA:  84 year old male status post fall in bathroom overnight. Left eye laceration and swelling. Metastatic lung cancer with numerous small brain metastases recently on November restaging MRI. EXAM: CT HEAD WITHOUT CONTRAST CT MAXILLOFACIAL WITHOUT CONTRAST TECHNIQUE: Multidetector CT imaging of the head and maxillofacial structures were performed using the standard protocol without intravenous contrast.  Multiplanar CT image reconstructions of the maxillofacial structures were also generated. COMPARISON:  Brain MRI 12/31/2017 and earlier. FINDINGS: CT HEAD FINDINGS Brain: No intracranial mass effect. No vasogenic edema is  evident by CT. Mild for age cerebral white matter hypodensity has increased since 2015. No acute intracranial hemorrhage identified. No ventriculomegaly. No cortically based acute infarct identified. Vascular: Calcified atherosclerosis at the skull base. No suspicious intracranial vascular hyperdensity. Skull: Stable and intact. Other: Tympanic cavities and mastoids are clear. Left periorbital findings are below. The other scalp soft tissues remain within normal limits. CT MAXILLOFACIAL FINDINGS Osseous: Small 4-5 millimeter lucent lesion along the ventral left mandible condyle is new since 2015 (series 4, image 26). Mandible otherwise intact. Supernumerary midline mandible tooth again noted. Maxilla and zygoma appear intact. Central skull base intact. Advanced cervical spine degeneration, the visible cervical spine appears stable since 2015. Orbits: Broad-based left periorbital superficial soft tissue hematoma up to 14 millimeters in thickness. The left globe remains intact. Left intraorbital soft tissues remain normal. Bilateral orbital walls remain intact. Right orbit is normal. Sinuses: Visualized paranasal sinuses and mastoids are stable and well pneumatized. Soft tissues: Visible noncontrast larynx, pharynx, parapharyngeal spaces, retropharyngeal space, sublingual space, submandibular spaces, parotid spaces and masticator spaces are within normal limits. No upper cervical lymphadenopathy. IMPRESSION: 1. Superficial left periorbital soft tissue hematoma without intraorbital injury. 2. No acute intracranial abnormality. Numerous small brain metastases demonstrated by MRI in November are not evident on noncontrast CT. 3. No face or skull fracture identified. Small 4-5 mm lucent lesion at the left mandible condyle is new since 2015 and could be degenerative but a small bone metastasis is not excluded. Electronically Signed   By: Genevie Ann M.D.   On: 02/09/2018 09:57    Cancer of lower lobe of left lung  ([place]) #Extensive stage small cell lung cancer-with brain brain metastases scheduled to the hospital for generalized weakness and falls.  #Extensive stage small cell lung cancer with brain metastases [patient currently status post radiation on December 18.].  Currently systemic therapy is on hold because of recent whole brain radiation.  Given the acute illness; I would recommend continued holding of systemic immunotherapy at this time.   #Brain mets-small cell lung cancer.  Status post whole brain radiation until December 18.  MRI February 12, 2018-overall improved lesions.  #Falls generalized weakness/delirium acute-likely secondary to infectious etiology/UTI.  Currently on antibiotics.   #Chronic CHF EF- 40-45%. continue lasix 20 BID/ K 20 .  Stable  Overall prognosis: Is poor.  Discussed with the patient's family in detail.  Patient unable to participate in discussion given his mental status.  Discussed that I would recommend continued current therapeutic plan to treat the UTI and await improvement of his mentation/physical condition.  However if patient continues to decline without any significant improvement in his clinical status hospice would be recommended.  Discussed regarding DNR/DNI with the family.  Also discussed with [facility] palliative care provider.   Thank you Dr.Chen for allowing me to participate in the care of your pleasant patient. Please do not hesitate to contact me with questions or concerns in the interim.  All questions were answered. The patient knows to call the clinic with any problems, questions or concerns.   Cammie Sickle, MD 02/13/2018 11:19 PM

## 2018-02-13 NOTE — Clinical Social Work Placement (Signed)
   CLINICAL SOCIAL WORK PLACEMENT  NOTE  Date:  02/13/2018  Patient Details  Name: Phillip Sanchez MRN: [id number] Date of Birth: 1927-10-08  Clinical Social Work is seeking post-discharge placement for this patient at the [place] level of care (*CSW will initial, date and re-position this form in  chart as items are completed):  Yes   Patient/family provided with [facility] Work Department's list of facilities offering this level of care within the geographic area requested by the patient (or if unable, by the patient's family).  Yes   Patient/family informed of their freedom to choose among providers that offer the needed level of care, that participate in Medicare, Medicaid or managed care program needed by the patient, have an available bed and are willing to accept the patient.  Yes   Patient/family informed of [facility]'s ownership interest in [facility] and [facility], as well as of the fact that they are under no obligation to receive care at these facilities.  PASRR submitted to EDS on 02/13/18     PASRR number received on 02/13/18     Existing PASRR number confirmed on       FL2 transmitted to all facilities in geographic area requested by pt/family on 02/13/18     FL2 transmitted to all facilities within larger geographic area on       Patient informed that his/her managed care company has contracts with or will negotiate with certain facilities, including the following:            Patient/family informed of bed offers received.  Patient chooses bed at       Physician recommends and patient chooses bed at      Patient to be transferred to   on  .  Patient to be transferred to facility by       Patient family notified on   of transfer.  Name of family member notified:        PHYSICIAN       Additional Comment:    _______________________________________________ Dolph Tavano, Veronia Beets, LCSW 02/13/2018, 4:33 PM

## 2018-02-13 NOTE — Plan of Care (Signed)

## 2018-02-13 NOTE — NC FL2 (Signed)
[place] LEVEL OF CARE SCREENING TOOL     IDENTIFICATION  Patient Name: Phillip Sanchez Birthdate: 1928/01/06 Sex: male Admission Date (Current Location): 02/07/2018  [place] and [place] Number:  Engineering geologist and Address:  [facility], [address] [place], [place] [address]      Provider Number: [id number]  Attending Physician Name and Address:  Demetrios Loll, MD  Relative Name and Phone Number:       Current Level of Care: Hospital Recommended Level of Care: [place] Prior Approval Number:    Date Approved/Denied:   PASRR Number: [id number])  Discharge Plan: SNF    Current Diagnoses: Patient Active Problem List   Diagnosis Date Noted  . Malnutrition of moderate degree 02/13/2018  . Palliative care encounter   . Acute metabolic encephalopathy 71/24/5809  . Dysuria 12/14/2017  . Chronic systolic heart failure ([place]) 09/22/2017  . HTN (hypertension) 09/22/2017  . Acute diastolic CHF (congestive heart failure) ([place]) 09/09/2017  . Small cell lung cancer ([place]) 08/04/2017  . Liver masses   . Goals of care, counseling/discussion   . Palliative care by specialist   . CAP (community acquired pneumonia) 07/26/2017  . Liver metastases ([place]) 07/19/2017  . Cancer of lower lobe of left lung ([place]) 01/16/2016    Orientation RESPIRATION BLADDER Height & Weight     Self  Normal Continent Weight: 169 lb 12.1 oz (77 kg) Height:  5\' 10"  (177.8 cm)  BEHAVIORAL SYMPTOMS/MOOD NEUROLOGICAL BOWEL NUTRITION STATUS      Incontinent Diet(Diet: Regular )  AMBULATORY STATUS COMMUNICATION OF NEEDS Skin   Extensive Assist Verbally Normal                       Personal Care Assistance Level of Assistance  Bathing, Feeding, Dressing Bathing Assistance: Limited assistance Feeding assistance: Independent Dressing Assistance: Limited assistance     Functional Limitations Info  Sight, Hearing, Speech Sight Info:  Adequate Hearing Info: Impaired Speech Info: Adequate    SPECIAL CARE FACTORS FREQUENCY  PT (By licensed PT), OT (By licensed OT)     PT Frequency: (5) OT Frequency: (5)            Contractures      Additional Factors Info  Code Status, Allergies Code Status Info: (DNR ) Allergies Info: (No Known Allergies. )           Current Medications (02/13/2018):  This is the current hospital active medication list Current Facility-Administered Medications  Medication Dose Route Frequency Provider Last Rate Last Dose  . 0.9 %  sodium chloride infusion   Intravenous Continuous Loletha Grayer, MD 40 mL/hr at 02/03/2018 2038    . acetaminophen (TYLENOL) tablet 650 mg  650 mg Oral Q6H PRN Loletha Grayer, MD   650 mg at 02/13/18 1008   Or  . acetaminophen (TYLENOL) suppository 650 mg  650 mg Rectal Q6H PRN Loletha Grayer, MD      . aspirin EC tablet 81 mg  81 mg Oral Daily Loletha Grayer, MD   81 mg at 02/13/18 1009  . bisacodyl (DULCOLAX) EC tablet 5 mg  5 mg Oral Daily PRN Loletha Grayer, MD      . carvedilol (COREG) tablet 3.125 mg  3.125 mg Oral BID WC Loletha Grayer, MD   3.125 mg at 02/13/18 1009  . cefTRIAXone (ROCEPHIN) 1 g in sodium chloride 0.9 % 100 mL IVPB  1 g Intravenous Q24H Loletha Grayer, MD      . [  START ON 02/14/2018] feeding supplement (ENSURE ENLIVE) (ENSURE ENLIVE) liquid 237 mL  237 mL Oral BID BM Demetrios Loll, MD      . loratadine (CLARITIN) tablet 10 mg  10 mg Oral Daily Loletha Grayer, MD   10 mg at 02/13/18 1009  . MEDLINE mouth rinse  15 mL Mouth Rinse BID Wieting, Richard, MD      . ondansetron [facility]) tablet 4 mg  4 mg Oral Q6H PRN Wieting, Richard, MD       Or  . ondansetron (ZOFRAN) injection 4 mg  4 mg Intravenous Q6H PRN Wieting, Richard, MD      . polyethylene glycol (MIRALAX / GLYCOLAX) packet 17 g  17 g Oral Daily PRN Wieting, Richard, MD      . QUEtiapine (SEROQUEL) tablet 25 mg  25 mg Oral QHS PRN Lance Coon, MD   25 mg at 02/11/2018  2344     Discharge Medications: Please see discharge summary for a list of discharge medications.  Relevant Imaging Results:  Relevant Lab Results:   Additional Information (SSN: [id number])  Amberley Hamler, Veronia Beets, LCSW

## 2018-02-13 NOTE — Progress Notes (Signed)
Patient asleep, no q4 hr vital signs taken per order instructions.

## 2018-02-13 NOTE — Assessment & Plan Note (Addendum)
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  #Extensive stage small cell lung cancer with brain metastases [patient currently status post radiation on December 18.].  Currently systemic therapy is on hold because of recent whole brain radiation.  Given the acute illness; I would recommend continued holding of systemic immunotherapy at this time.   #Brain mets-small cell lung cancer.  Status post whole brain radiation until December 18.  MRI February 12, 2018-overall improved lesions.  #Falls generalized weakness/delirium acute-likely secondary to infectious etiology/UTI.  Currently on antibiotics.   #Chronic CHF EF- 40-45%. continue lasix 20 BID/ K 20 .  Stable  Overall prognosis: Is poor.  Discussed with the patient's family in detail.  Patient unable to participate in discussion given his mental status.  Discussed that I would recommend continued current therapeutic plan to treat the UTI and await improvement of his mentation/physical condition.  However if patient continues to decline without any significant improvement in his clinical status hospice would be recommended.  Discussed regarding DNR/DNI with the family.  Also discussed with [facility] palliative care provider.   Thank you Dr.Chen for allowing me to participate in the care of your pleasant patient. Please do not hesitate to contact me with questions or concerns in the interim.

## 2018-02-13 NOTE — Evaluation (Signed)
Physical Therapy Evaluation Patient Details Name: Phillip Sanchez MRN: [id number] DOB: 1927/07/19 Today's Date: 02/13/2018   History of Present Illness  presented to ER secondary to multiple falls, progressive weakness in recent 2-3 weeks; admitted with acute metabolic encephalopathy due to UTI.  Of note, patient PMH significant for small cell lung cancer with brain mets (radiation completed 12/18)  Clinical Impression  Upon evaluation, patient alert and oriented to self; inconsistently follows commands with increased time for processing and task initiation.  Level of alertness and cognition fluctuates during session.  Generally weak and deconditioned throughout all extremities; difficulty effectively coordinating purposeful extremity movement on command/in isolation (improves with spontaneous, automatic movement).  L ankle foot drop noted (0/5 DF) with diminished sensory input L knee distally (present for last 3-4 weeks per sons).  Currently requiring mod assist +2 for bed mobility; min/mod assist +2 with bilat HHA for sit/stand, static standing balance.  Increasing posterior trunk lean with fatigue/divided attention during standing trials; absent attempts at self-correction.  Stepping/gait deferred as result; will continue to assess/progress as medically appropriate. Would benefit from skilled PT to address above deficits and promote optimal return to PLOF;d recommend transition to STR upon discharge from acute hospitalization.     Follow Up Recommendations SNF    Equipment Recommendations       Recommendations for Other Services       Precautions / Restrictions Precautions Precautions: Fall Precaution Comments: R chest port, fractured ribs to L Restrictions Weight Bearing Restrictions: No      Mobility  Bed Mobility Overal bed mobility: Needs Assistance Bed Mobility: Supine to Sit;Sit to Supine     Supine to sit: Mod assist;+2 for physical assistance Sit to supine: Mod  assist;+2 for physical assistance      Transfers Overall transfer level: Needs assistance Equipment used: 2 person hand held assist Transfers: Sit to/from Stand Sit to Stand: Min assist;Mod assist         General transfer comment: fair LE strength/power, assist for anterior weight translation and standing balance  Ambulation/Gait             General Gait Details: unsafe/unable due to balance deficits, fatigue  Stairs            Wheelchair Mobility    Modified Rankin (Stroke Patients Only)       Balance Overall balance assessment: Needs assistance Sitting-balance support: No upper extremity supported;Feet supported Sitting balance-Leahy Scale: Fair     Standing balance support: Bilateral upper extremity supported Standing balance-Leahy Scale: Zero Standing balance comment: posterior trunk lean, limited/no balance correction or self-righting reactions                             Pertinent Vitals/Pain Pain Assessment: Faces Faces Pain Scale: Hurts little more Pain Location: L flank Pain Descriptors / Indicators: Aching;Grimacing Pain Intervention(s): Limited activity within patient's tolerance;Monitored during session;Repositioned    Home Living Family/patient expects to be discharged to:: Private residence Living Arrangements: Spouse/significant other(wife with end-stage dementia; unable to recognize husband at this point) Available Help at Discharge: Family;Available PRN/intermittently Type of Home: House Home Access: Ramped entrance     Home Layout: Two level;Able to live on main level with bedroom/bathroom        Prior Function Level of Independence: Needs assistance         Comments: Baseline ambulatory with [employer] for household distances, recent progression to [facility] as primary mobility (during radiation treatments); family does  endorse progressive weakness and increased falls in recent weeks (resulting in L eye hematoma, L posterior  rib fractures).       Hand Dominance        Extremity/Trunk Assessment   Upper Extremity Assessment Upper Extremity Assessment: Generalized weakness    Lower Extremity Assessment Lower Extremity Assessment: Generalized weakness(grossly at least 4-/5 except L ankle (0/5); diminished sensation L knee distally (family reports noted over recent 3-4 weeks))       Communication   Communication: HOH  Cognition Arousal/Alertness: Lethargic Behavior During Therapy: Flat affect Overall Cognitive Status: Difficult to assess                                 General Comments: fluctuating level of alertness and cognition throughout evaluation; inconsistent command-following, delayed processing and comprehension      General Comments      Exercises Other Exercises Other Exercises: Sit/stand x2 with bilat HHA, min/mod assist +2 for safety and balance Other Exercises: Dep assist for hygiene, clothing management and linen change after incontinent bowel episode (completed in standing, mod assist +2 for balance)   Assessment/Plan    PT Assessment Patient needs continued PT services  PT Problem List Decreased strength;Decreased range of motion;Decreased activity tolerance;Decreased balance;Decreased mobility;Decreased cognition;Decreased coordination;Decreased knowledge of use of DME;Decreased safety awareness;Decreased knowledge of precautions;Cardiopulmonary status limiting activity;Impaired sensation;Decreased skin integrity;Pain       PT Treatment Interventions DME instruction;Gait training;Functional mobility training;Therapeutic exercise;Balance training;Cognitive remediation;Therapeutic activities;Neuromuscular re-education;Patient/family education;Wheelchair mobility training    PT Goals (Current goals can be found in the Care Plan section)  Acute Rehab PT Goals Patient Stated Goal: per sons, to have more help and get him stronger PT Goal Formulation: With  patient/family Time For Goal Achievement: 02/27/18 Potential to Achieve Goals: Fair    Frequency Min 2X/week   Barriers to discharge Decreased caregiver support      Co-evaluation               AM-PAC PT "6 Clicks" Mobility  Outcome Measure Help needed turning from your back to your side while in a flat bed without using bedrails?: A Lot Help needed moving from lying on your back to sitting on the side of a flat bed without using bedrails?: A Lot Help needed moving to and from a bed to a chair (including a wheelchair)?: Total Help needed standing up from a chair using your arms (e.g., wheelchair or bedside chair)?: A Lot Help needed to walk in hospital room?: Total Help needed climbing 3-5 steps with a railing? : Total 6 Click Score: 9    End of Session Equipment Utilized During Treatment: Gait belt Activity Tolerance: Patient tolerated treatment well Patient left: in bed;with call bell/phone within reach;with family/visitor present Nurse Communication: Mobility status PT Visit Diagnosis: Muscle weakness (generalized) (M62.81);Difficulty in walking, not elsewhere classified (R26.2)    Time: 1000-1046 PT Time Calculation (min) (ACUTE ONLY): 46 min   Charges:   PT Evaluation $PT Eval Moderate Complexity: 1 Mod PT Treatments $Therapeutic Activity: 23-37 mins        Donnica Jarnagin H. Owens Shark, PT, DPT, NCS 02/13/18, 11:16 AM [phone]

## 2018-02-13 NOTE — Progress Notes (Signed)
Phillip Lose, MD r/t patient trying to urinate since 1930 without success. Bladder scanned first time with 218ml showing. Bladder scanned again at 2320 with 353ml showing. In and out one time was ordered and completed with 316ml withdrawn. Will bladder scan again prior to shift change per MD order.

## 2018-02-13 NOTE — Progress Notes (Addendum)
[place] [facility] [place] NAME: Phillip Sanchez    MR#:  [id number]  DATE OF BIRTH:  04-02-1927  SUBJECTIVE:  CHIEF COMPLAINT:   Chief Complaint  Patient presents with  . Failure To Thrive   The patient is more awake but still confused. REVIEW OF SYSTEMS:  Review of Systems  Unable to perform ROS: Mental status change    DRUG ALLERGIES:  No Known Allergies VITALS:  Blood pressure (!) 151/56, pulse 100, temperature 99.7 F (37.6 C), temperature source Axillary, resp. rate 18, height 5\' 10"  (1.778 m), weight 77 kg, SpO2 91 %. PHYSICAL EXAMINATION:  Physical Exam Constitutional:      General: He is not in acute distress. Eyes:     General: No scleral icterus.    Conjunctiva/sclera: Conjunctivae normal.     Pupils: Pupils are equal, round, and reactive to light.     Comments: hematoma over his left eye  Neck:     Musculoskeletal: Normal range of motion and neck supple.     Vascular: No JVD.     Trachea: No tracheal deviation.  Cardiovascular:     Rate and Rhythm: Normal rate and regular rhythm.     Heart sounds: Normal heart sounds. No murmur. No gallop.   Pulmonary:     Effort: Pulmonary effort is normal. No respiratory distress.     Breath sounds: Normal breath sounds. No wheezing or rales.  Abdominal:     General: Bowel sounds are normal. There is no distension.     Palpations: Abdomen is soft.     Tenderness: There is no abdominal tenderness. There is no rebound.  Musculoskeletal: Normal range of motion.        General: No tenderness.     Right lower leg: No edema.     Left lower leg: No edema.  Skin:    Findings: No erythema or rash.     Comments: hematoma over left side of his body  Neurological:     General: No focal deficit present.     Mental Status: He is alert and oriented to person, place, and time.     Cranial Nerves: No cranial nerve deficit.  Psychiatric:     Comments: Confused.    LABORATORY PANEL:   Male CBC Recent Labs  Lab 02/13/18 0344  WBC 13.2*  HGB 8.4*  HCT 26.7*  PLT 138*   ------------------------------------------------------------------------------------------------------------------ Chemistries  Recent Labs  Lab 02/09/18 0948  02/13/18 0344  NA 141   < > 139  K 3.5   < > 3.7  CL 109   < > 106  CO2 24   < > 21*  GLUCOSE 107*   < > 124*  BUN 44*   < > 36*  CREATININE 1.20   < > 1.18  CALCIUM 8.6*   < > 8.2*  AST 64*  --   --   ALT 40  --   --   ALKPHOS 75  --   --   BILITOT 1.0  --   --    < > = values in this interval not displayed.   RADIOLOGY:  Mr Jeri Cos Wo Contrast  Result Date: 02/14/2018 CLINICAL DATA:  Recent fall with confusion. History of lung cancer with brain metastases. EXAM: MRI HEAD WITHOUT AND WITH CONTRAST TECHNIQUE: Multiplanar, multiecho pulse sequences of the brain and surrounding structures were obtained without and with intravenous contrast. CONTRAST:  8 mL Gadavist COMPARISON:  Head CT  02/09/2018 and MRI 12/31/2017 FINDINGS: Due to patient confusion, there is progressive, moderate motion artifact during the examination. A sagittal T1 postcontrast sequence was not obtained. Brain: There is no evidence of acute infarct, midline shift, or extra-axial fluid collection. Mild cerebral atrophy is within normal limits for age. Patchy T2 hyperintensities in the cerebral white matter are similar to the prior MRI and nonspecific but compatible with chronic small vessel ischemic disease, relatively mild for age. Scattered foci of chronic microhemorrhage are again seen in both cerebral hemispheres and right cerebellum, at least some of which are associated with known metastases. The vast majority of the small enhancing brain metastases on the prior study are not identified on the current examination, however this may reflect both a decreased size of some of the lesions as well as obscuration of some lesions due to the extent of motion artifact. A 2 mm  enhancing lesion in the inferior left cerebellar tonsil is grossly unchanged (series 15, image 4). The 13 mm right cerebellar lesion on the prior study has decreased in size with at most a punctate focus of enhancement remaining. There is susceptibility artifact associated with the left caudate lesion without definite residual enhancement. A 3 mm enhancing lesion is questioned in the right frontal lobe near the anterior aspect of the insula on the coronal series (series 16, image 21), not confirmed on the axial sequence and possibly artifactual. Right cerebellar and left occipital edema has resolved, and no new areas of significant edema are identified. Vascular: Major intracranial vascular flow voids are grossly preserved. Skull and upper cervical spine: Unremarkable bone marrow signal. Sinuses/Orbits: Unremarkable orbits. Paranasal sinuses and mastoid air cells are clear. Other: None. IMPRESSION: 1. Motion degraded examination. No evidence of acute intracranial abnormality. 2. Nonvisualization of the majority of the previously seen metastases. Some of the metastases have clearly decreased in size while others may be obscured by the motion artifact. 3. New 3 mm right frontal metastasis versus artifact. 4. No residual or new vasogenic edema. Electronically Signed   By: Logan Bores M.D.   On: 01/18/2018 19:38   ASSESSMENT AND PLAN:  1.  Acute metabolic encephalopathy.  Likely from acute cystitis with leukocytosis.    Continue Rocephin and follow-up urine culture.  2.  Small cell lung cancer metastatic to liver and the brain.  Overall prognosis is poor.  Patient wishes to be a full code. MRI of the brain show smaller metastasis.  He is status post radiation and chemotherapy.  3.  Elevated troponin likely demand ischemia. 4.  Hypertension.    Resume home dose Coreg and Lotrel due to elevated blood pressure and tachycardia.  5.  Last EKG with bradycardia and heart block.  Mild tachycardia.  Dehydration.   Continue IV fluid support. Anemia of chronic disease.  Stable. Moderate Malnutrition related to chronic illness.  Follow dietitian's recommendation.  Poor prognosis, palliative care consult for possible palliative care or hospice care at home. All the records are reviewed and case discussed with Care Management/Social Worker. Management plans discussed with the patient, his 2 sons, and they are in agreement.  CODE STATUS: DNR  TOTAL TIME TAKING CARE OF THIS PATIENT: 33 minutes.   More than 50% of the time was spent in counseling/coordination of care: YES  POSSIBLE D/C IN 2 DAYS, DEPENDING ON CLINICAL CONDITION.   Demetrios Loll M.D on 02/13/2018 at 3:28 PM  Between 7am to 6pm - Pager - [phone]  After 6pm go to [email or web address] - [place]  [employer]

## 2018-02-13 NOTE — Consult Note (Signed)
[place] [place]  Telephone:([phone] [phone]   Name: Phillip Sanchez Date: 02/13/2018 MRN: [id number]  DOB: 09-10-1927  Patient Care Team: Idelle Crouch, MD as PCP - General (Internal Medicine) Telford Nab, RN as Registered Nurse Fath, Javier Docker, MD as Consulting Physician (Cardiology) Cammie Sickle, MD as Medical Oncologist (Medical Oncology)    REASON FOR CONSULTATION: Palliative Care consult requested for this 84 y.o. 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  Patient was admitted to the hospital on 01/18/2018 with AMS and FTT. He has had multiple recent falls suffering rib fractures and a large hematoma above the L. Eye. Etiology of AMS is unclear but patient is being treated for a UTI. Patient was referred to palliative care to address goals.   SOCIAL HISTORY:    Patient lives at home with his wife. They have been married 6 years. She has dementia. Patient has 4 children who live nearby.  ADVANCE DIRECTIVES:  Does not have  CODE STATUS: DNR  PAST MEDICAL HISTORY: Past Medical History:  Diagnosis Date  . Anemia   . CHF (congestive heart failure) ([place])   . Colon cancer [facility])    He states they removed cancerous polyps.  . Hypercholesteremia   . Hypertension   . Lung cancer ([place])   . Myocardial infarction ([place])   . Osteoarthritis   . Prostate cancer [facility])    Prostatectomy.  . Small cell lung cancer ([place]) 08/04/2017    PAST SURGICAL HISTORY:  Past Surgical History:  Procedure Laterality Date  . COLON SURGERY    . CORONARY ANGIOPLASTY WITH STENT PLACEMENT    . herniorrhaphy    . PORTA CATH INSERTION N/A 08/05/2017   Procedure: PORTA CATH INSERTION;  Surgeon: Katha Cabal, MD;  Location: [facility];  Service: Cardiovascular;  Laterality: N/A;  . prostactectomy      HEMATOLOGY/ONCOLOGY HISTORY:  Oncology History   # 2015- LLL [place] ca stage I s/p SBRT [Dr.Crystal]  # 3rd June 2019- Multiple liver lesions- s/p Bx- SMALL CELL of lung origin; STAGE IV;  # June 26th- carbo-etop-atezo  # NOV 17th 2019-more than 20 brain lesions the largest 1.2 mm right cerebellum-recommend whole brain radiation [finish [id number]]  # Prostate cancer [ 57 years ago]; s/p surgery  # colon cancer [s/p resection of malignant polyps; Dr/Elliot; No colectomy]  # hx CAD [asprin/plavix]; hx PVD  -----------------------------------------------   DIAGNOSIS: Small cell lung cancer  STAGE:  IV   ;GOALS: Palliative  CURRENT/MOST RECENT THERAPY: carbo-etop-Atezo      Cancer of lower lobe of left lung ([place])   11/22/2017 -  Chemotherapy    The patient had atezolizumab (TECENTRIQ) 1,200 mg in sodium chloride 0.9 % 250 mL chemo infusion, 1,200 mg, Intravenous, Once, 2 of 6 cycles Administration: 1,200 mg (11/23/2017), 1,200 mg (12/14/2017)  for chemotherapy treatment.      Small cell lung cancer ([place])   08/04/2017 Initial Diagnosis    Small cell lung cancer ([place])    08/04/2017 - 11/17/2017 Chemotherapy    The patient had palonosetron (ALOXI) injection 0.25 mg, 0.25 mg, Intravenous,  Once, 4 of 4 cycles Administration: 0.25 mg (08/10/2017), 0.25 mg (09/07/2017), 0.25 mg (10/04/2017), 0.25 mg (10/26/2017) pegfilgrastim-cbqv (UDENYCA) injection 6  mg, 6 mg, Subcutaneous, Once, 4 of 4 cycles Administration: 6 mg (08/15/2017), 6 mg (10/07/2017), 6 mg (10/31/2017) CARBOplatin (PARAPLATIN) 340 mg in sodium chloride 0.9 % 250 mL chemo infusion, 340 mg (100 % of original dose 340.5 mg), Intravenous,  Once, 4 of 4 cycles Dose modification:   (original dose 340.5 mg, Cycle 1),   (original dose 431 mg, Cycle 2) Administration: 340 mg (08/10/2017), 430 mg (09/07/2017), 430 mg  (10/04/2017), 430 mg (10/26/2017) etoposide (VEPESID) 200 mg in sodium chloride 0.9 % 500 mL chemo infusion, 210 mg, Intravenous,  Once, 4 of 4 cycles Dose modification: 80 mg/m2 (original dose 100 mg/m2, Cycle 3, Reason: Provider Judgment) Administration: 200 mg (08/10/2017), 200 mg (08/11/2017), 200 mg (08/12/2017), 200 mg (09/07/2017), 200 mg (09/08/2017), 160 mg (10/04/2017), 160 mg (10/05/2017), 160 mg (10/06/2017), 160 mg (10/26/2017), 160 mg (10/27/2017), 160 mg (10/28/2017) fosaprepitant (EMEND) 150 mg, dexamethasone (DECADRON) 12 mg in sodium chloride 0.9 % 145 mL IVPB, , Intravenous,  Once, 4 of 4 cycles Administration:  (08/10/2017),  (09/07/2017),  (10/04/2017),  (10/26/2017) atezolizumab (TECENTRIQ) 1,200 mg in sodium chloride 0.9 % 250 mL chemo infusion, 1,200 mg, Intravenous, Once, 4 of 4 cycles Administration: 1,200 mg (08/10/2017), 1,200 mg (09/07/2017), 1,200 mg (10/04/2017), 1,200 mg (10/26/2017)  for chemotherapy treatment.      ALLERGIES:  has No Known Allergies.  MEDICATIONS:  Current Facility-Administered Medications  Medication Dose Route Frequency Provider Last Rate Last Dose  . 0.9 %  sodium chloride infusion   Intravenous Continuous Loletha Grayer, MD 40 mL/hr at 01/26/2018 2038    . acetaminophen (TYLENOL) tablet 650 mg  650 mg Oral Q6H PRN Loletha Grayer, MD   650 mg at 02/13/18 1008   Or  . acetaminophen (TYLENOL) suppository 650 mg  650 mg Rectal Q6H PRN Loletha Grayer, MD      . aspirin EC tablet 81 mg  81 mg Oral Daily Loletha Grayer, MD   81 mg at 02/13/18 1009  . bisacodyl (DULCOLAX) EC tablet 5 mg  5 mg Oral Daily PRN Loletha Grayer, MD      . carvedilol (COREG) tablet 3.125 mg  3.125 mg Oral BID WC Loletha Grayer, MD   3.125 mg at 02/13/18 1009  . cefTRIAXone (ROCEPHIN) 1 g in sodium chloride 0.9 % 100 mL IVPB  1 g Intravenous Q24H Wieting, Richard, MD      . loratadine (CLARITIN) tablet 10 mg  10 mg Oral Daily Loletha Grayer, MD   10 mg at 02/13/18 1009  .  MEDLINE mouth rinse  15 mL Mouth Rinse BID Loletha Grayer, MD      . ondansetron [facility]) tablet 4 mg  4 mg Oral Q6H PRN Loletha Grayer, MD       Or  . ondansetron (ZOFRAN) injection 4 mg  4 mg Intravenous Q6H PRN Wieting, Richard, MD      . polyethylene glycol (MIRALAX / GLYCOLAX) packet 17 g  17 g Oral Daily PRN Loletha Grayer, MD      . QUEtiapine (SEROQUEL) tablet 25 mg  25 mg Oral QHS PRN Lance Coon, MD   25 mg at 02/04/2018 2344    VITAL SIGNS: BP (!) 151/56 (BP Location: Left Arm)   Pulse 100   Temp 99.7 F (37.6 C) (Axillary)   Resp 18   Ht '5\' 10"'  (1.778 m)   Wt 169 lb 12.1 oz (77 kg)   SpO2 91%   BMI 24.36 kg/m  Filed Weights   01/21/2018 1439  Weight: 169  lb 12.1 oz (77 kg)    Estimated body mass index is 24.36 kg/m as calculated from the following:   Height as of this encounter: '5\' 10"'  (1.778 m).   Weight as of this encounter: 169 lb 12.1 oz (77 kg).  LABS: CBC:    Component Value Date/Time   WBC 13.2 (H) 02/13/2018 0344   HGB 8.4 (L) 02/13/2018 0344   HGB 14.3 01/24/2014 1057   HCT 26.7 (L) 02/13/2018 0344   HCT 42.7 01/24/2014 1057   PLT 138 (L) 02/13/2018 0344   PLT 218 01/24/2014 1057   MCV 108.5 (H) 02/13/2018 0344   MCV 99 01/24/2014 1057   NEUTROABS 10.4 (H) 02/11/2018 1449   NEUTROABS 4.4 01/24/2014 1057   LYMPHSABS 0.3 (L) 01/27/2018 1449   LYMPHSABS 1.5 01/24/2014 1057   MONOABS 1.5 (H) 02/10/2018 1449   MONOABS 0.8 01/24/2014 1057   EOSABS 0.2 02/07/2018 1449   EOSABS 0.3 01/24/2014 1057   BASOSABS 0.1 01/28/2018 1449   BASOSABS 0.1 01/24/2014 1057   Comprehensive Metabolic Panel:    Component Value Date/Time   NA 139 02/13/2018 0344   NA 143 01/24/2014 1057   K 3.7 02/13/2018 0344   K 3.9 01/24/2014 1057   CL 106 02/13/2018 0344   CL 106 01/24/2014 1057   CO2 21 (L) 02/13/2018 0344   CO2 27 01/24/2014 1057   BUN 36 (H) 02/13/2018 0344   BUN 24 (H) 01/24/2014 1057   CREATININE 1.18 02/13/2018 0344   CREATININE 1.19  01/24/2014 1057   GLUCOSE 124 (H) 02/13/2018 0344   GLUCOSE 105 (H) 01/24/2014 1057   CALCIUM 8.2 (L) 02/13/2018 0344   CALCIUM 8.9 01/24/2014 1057   AST 64 (H) 02/09/2018 0948   AST 19 01/24/2014 1057   ALT 40 02/09/2018 0948   ALT 25 01/24/2014 1057   ALKPHOS 75 02/09/2018 0948   ALKPHOS 73 01/24/2014 1057   BILITOT 1.0 02/09/2018 0948   BILITOT 0.6 01/24/2014 1057   PROT 5.9 (L) 02/09/2018 0948   PROT 6.8 01/24/2014 1057   ALBUMIN 3.1 (L) 02/09/2018 0948   ALBUMIN 3.5 01/24/2014 1057    RADIOGRAPHIC STUDIES: Ct Head Wo Contrast  Result Date: 02/09/2018 CLINICAL DATA:  84 year old male status post fall in bathroom overnight. Left eye laceration and swelling. Metastatic lung cancer with numerous small brain metastases recently on November restaging MRI. EXAM: CT HEAD WITHOUT CONTRAST CT MAXILLOFACIAL WITHOUT CONTRAST TECHNIQUE: Multidetector CT imaging of the head and maxillofacial structures were performed using the standard protocol without intravenous contrast. Multiplanar CT image reconstructions of the maxillofacial structures were also generated. COMPARISON:  Brain MRI 12/31/2017 and earlier. FINDINGS: CT HEAD FINDINGS Brain: No intracranial mass effect. No vasogenic edema is evident by CT. Mild for age cerebral white matter hypodensity has increased since 2015. No acute intracranial hemorrhage identified. No ventriculomegaly. No cortically based acute infarct identified. Vascular: Calcified atherosclerosis at the skull base. No suspicious intracranial vascular hyperdensity. Skull: Stable and intact. Other: Tympanic cavities and mastoids are clear. Left periorbital findings are below. The other scalp soft tissues remain within normal limits. CT MAXILLOFACIAL FINDINGS Osseous: Small 4-5 millimeter lucent lesion along the ventral left mandible condyle is new since 2015 (series 4, image 26). Mandible otherwise intact. Supernumerary midline mandible tooth again noted. Maxilla and zygoma  appear intact. Central skull base intact. Advanced cervical spine degeneration, the visible cervical spine appears stable since 2015. Orbits: Broad-based left periorbital superficial soft tissue hematoma up to 14 millimeters in thickness. The left globe  remains intact. Left intraorbital soft tissues remain normal. Bilateral orbital walls remain intact. Right orbit is normal. Sinuses: Visualized paranasal sinuses and mastoids are stable and well pneumatized. Soft tissues: Visible noncontrast larynx, pharynx, parapharyngeal spaces, retropharyngeal space, sublingual space, submandibular spaces, parotid spaces and masticator spaces are within normal limits. No upper cervical lymphadenopathy. IMPRESSION: 1. Superficial left periorbital soft tissue hematoma without intraorbital injury. 2. No acute intracranial abnormality. Numerous small brain metastases demonstrated by MRI in November are not evident on noncontrast CT. 3. No face or skull fracture identified. Small 4-5 mm lucent lesion at the left mandible condyle is new since 2015 and could be degenerative but a small bone metastasis is not excluded. Electronically Signed   By: Genevie Ann M.D.   On: 02/09/2018 09:57   Mr Jeri Cos GU Contrast  Result Date: 01/22/2018 CLINICAL DATA:  Recent fall with confusion. History of lung cancer with brain metastases. EXAM: MRI HEAD WITHOUT AND WITH CONTRAST TECHNIQUE: Multiplanar, multiecho pulse sequences of the brain and surrounding structures were obtained without and with intravenous contrast. CONTRAST:  8 mL Gadavist COMPARISON:  Head CT 02/09/2018 and MRI 12/31/2017 FINDINGS: Due to patient confusion, there is progressive, moderate motion artifact during the examination. A sagittal T1 postcontrast sequence was not obtained. Brain: There is no evidence of acute infarct, midline shift, or extra-axial fluid collection. Mild cerebral atrophy is within normal limits for age. Patchy T2 hyperintensities in the cerebral white matter  are similar to the prior MRI and nonspecific but compatible with chronic small vessel ischemic disease, relatively mild for age. Scattered foci of chronic microhemorrhage are again seen in both cerebral hemispheres and right cerebellum, at least some of which are associated with known metastases. The vast majority of the small enhancing brain metastases on the prior study are not identified on the current examination, however this may reflect both a decreased size of some of the lesions as well as obscuration of some lesions due to the extent of motion artifact. A 2 mm enhancing lesion in the inferior left cerebellar tonsil is grossly unchanged (series 15, image 4). The 13 mm right cerebellar lesion on the prior study has decreased in size with at most a punctate focus of enhancement remaining. There is susceptibility artifact associated with the left caudate lesion without definite residual enhancement. A 3 mm enhancing lesion is questioned in the right frontal lobe near the anterior aspect of the insula on the coronal series (series 16, image 21), not confirmed on the axial sequence and possibly artifactual. Right cerebellar and left occipital edema has resolved, and no new areas of significant edema are identified. Vascular: Major intracranial vascular flow voids are grossly preserved. Skull and upper cervical spine: Unremarkable bone marrow signal. Sinuses/Orbits: Unremarkable orbits. Paranasal sinuses and mastoid air cells are clear. Other: None. IMPRESSION: 1. Motion degraded examination. No evidence of acute intracranial abnormality. 2. Nonvisualization of the majority of the previously seen metastases. Some of the metastases have clearly decreased in size while others may be obscured by the motion artifact. 3. New 3 mm right frontal metastasis versus artifact. 4. No residual or new vasogenic edema. Electronically Signed   By: Logan Bores M.D.   On: 02/05/2018 19:38   Ct Maxillofacial Wo Contrast  Result  Date: 02/09/2018 CLINICAL DATA:  84 year old male status post fall in bathroom overnight. Left eye laceration and swelling. Metastatic lung cancer with numerous small brain metastases recently on November restaging MRI. EXAM: CT HEAD WITHOUT CONTRAST CT MAXILLOFACIAL WITHOUT CONTRAST TECHNIQUE:  Multidetector CT imaging of the head and maxillofacial structures were performed using the standard protocol without intravenous contrast. Multiplanar CT image reconstructions of the maxillofacial structures were also generated. COMPARISON:  Brain MRI 12/31/2017 and earlier. FINDINGS: CT HEAD FINDINGS Brain: No intracranial mass effect. No vasogenic edema is evident by CT. Mild for age cerebral white matter hypodensity has increased since 2015. No acute intracranial hemorrhage identified. No ventriculomegaly. No cortically based acute infarct identified. Vascular: Calcified atherosclerosis at the skull base. No suspicious intracranial vascular hyperdensity. Skull: Stable and intact. Other: Tympanic cavities and mastoids are clear. Left periorbital findings are below. The other scalp soft tissues remain within normal limits. CT MAXILLOFACIAL FINDINGS Osseous: Small 4-5 millimeter lucent lesion along the ventral left mandible condyle is new since 2015 (series 4, image 26). Mandible otherwise intact. Supernumerary midline mandible tooth again noted. Maxilla and zygoma appear intact. Central skull base intact. Advanced cervical spine degeneration, the visible cervical spine appears stable since 2015. Orbits: Broad-based left periorbital superficial soft tissue hematoma up to 14 millimeters in thickness. The left globe remains intact. Left intraorbital soft tissues remain normal. Bilateral orbital walls remain intact. Right orbit is normal. Sinuses: Visualized paranasal sinuses and mastoids are stable and well pneumatized. Soft tissues: Visible noncontrast larynx, pharynx, parapharyngeal spaces, retropharyngeal space, sublingual  space, submandibular spaces, parotid spaces and masticator spaces are within normal limits. No upper cervical lymphadenopathy. IMPRESSION: 1. Superficial left periorbital soft tissue hematoma without intraorbital injury. 2. No acute intracranial abnormality. Numerous small brain metastases demonstrated by MRI in November are not evident on noncontrast CT. 3. No face or skull fracture identified. Small 4-5 mm lucent lesion at the left mandible condyle is new since 2015 and could be degenerative but a small bone metastasis is not excluded. Electronically Signed   By: Genevie Ann M.D.   On: 02/09/2018 09:57    PERFORMANCE STATUS (ECOG) : 4 - Bedbound  Review of Systems As noted above. Otherwise, a complete review of systems is negative.  Physical Exam General: ill appearing HEENT: large periorbital bruising to L. eye Cardiovascular: regular rate and rhythm Pulmonary: clear ant fields Abdomen: soft, nontender, + bowel sounds GU: no suprapubic tenderness Extremities: no edema, no joint deformities Skin: no rashes Neurological: lethargic, wakes briefly when stimulated  IMPRESSION: Patient is known to me from the clinic.  He is frail appearing in bed. He has been sleeping much of the day. He wakes at times but has some intermittent confusion per family.  Oral intake has been minimal.  Patient also has some pain at times from his rib fractures. He has received acetaminophen. I talked with son about starting stronger pain medication but he wants to hold off for now.   I met today with patient's son, [name]. He has also met earlier today with Dr. Rogue Bussing.  Son says he recognizes that patient is declining.  He says all family have talked and do not expect that patient will have a good outcome.  They are anticipating that patient is likely nearing end-of-life.  However, they are in agreement with the current treatment plan and would like to give patient a few days to see if he improves.  If patient had  significant improvement in family would consider rehab.  If patient does not clinically improve family are open to pursuing residential hospice with a focus on patient's comfort at end-of-life.  We discussed CODE STATUS.  Patient apparently opted to be a full code in the ER.  However, son says that  family do not think that patient understood the impact of that decision.  Family have talked and son says that they would not want patient to be resuscitated or have his life prolonged artificially.  Son says that he thinks patient would really want to be a DNR.  Son verbalized agreement with changing CODE STATUS to DNR.  PLAN: Continue current scope of treatment Family would like to pursue rehab if patient improves If patient does not improve, family would like to pursue residential hospice DNR   Time Total: 45 minutes  Visit consisted of counseling and education dealing with the complex and emotionally intense issues of symptom management and palliative care in the setting of serious and potentially life-threatening illness.Greater than 50%  of this time was spent counseling and coordinating care related to the above assessment and plan.  Signed by: Altha Harm, PhD, NP-C [phone] (Work Cell)

## 2018-02-13 NOTE — Progress Notes (Signed)
Advanced Care Plan.  Purpose of Encounter: Palliative care and hospice care. Parties in Attendance: The patient, his 2 sons, nurse and me. Patient's Decisional Capacity: Not sure. Medical Story: Chistopher Mangino  is a 84 y.o. male with a known history of small cell lung cancer metastatic to brain, CHF, colon cancer, anemia, hypertension, MI etc.  The patient is admitted to the hospital due to acute metabolic encephalopathy due to UTI.  I discussed with the patient's sons about his current condition, poor prognosis, palliative care and hospice care.  According to his son, oncologist discussed palliative care and hospice care before but family did not decide.  They agreed to get a.  Care consult for possible hospice care or hospice home. Goals of Care Determinations: Hospice care. Plan:  Code Status: Full code. Time spent discussing advance care planning: 20 minutes.

## 2018-02-14 LAB — CBC
HEMATOCRIT: 25.8 % — AB (ref 39.0–52.0)
Hemoglobin: 8.3 g/dL — ABNORMAL LOW (ref 13.0–17.0)
MCH: 34.3 pg — ABNORMAL HIGH (ref 26.0–34.0)
MCHC: 32.2 g/dL (ref 30.0–36.0)
MCV: 106.6 fL — AB (ref 80.0–100.0)
Platelets: 170 10*3/uL (ref 150–400)
RBC: 2.42 MIL/uL — ABNORMAL LOW (ref 4.22–5.81)
RDW: 14.8 % (ref 11.5–15.5)
WBC: 14.7 10*3/uL — ABNORMAL HIGH (ref 4.0–10.5)
nRBC: 0 % (ref 0.0–0.2)

## 2018-02-14 LAB — BASIC METABOLIC PANEL
Anion gap: 10 (ref 5–15)
BUN: 60 mg/dL — AB (ref 8–23)
CO2: 24 mmol/L (ref 22–32)
Calcium: 8.1 mg/dL — ABNORMAL LOW (ref 8.9–10.3)
Chloride: 109 mmol/L (ref 98–111)
Creatinine, Ser: 1.03 mg/dL (ref 0.61–1.24)
GFR calc Af Amer: >60 mL/min (ref >60)
GFR calc non Af Amer: >60 mL/min (ref >60)
GLUCOSE: 204 mg/dL — AB (ref 70–99)
Potassium: 3.6 mmol/L (ref 3.5–5.1)
Sodium: 143 mmol/L (ref 135–145)

## 2018-02-14 LAB — MAGNESIUM: Magnesium: 2.6 mg/dL — ABNORMAL HIGH (ref 1.7–2.4)

## 2018-02-14 MED ORDER — SODIUM CHLORIDE 0.9 % IV SOLN
INTRAVENOUS | Status: DC
  Administered 2018-02-14 (×2): via INTRAVENOUS

## 2018-02-14 MED ORDER — MORPHINE SULFATE (PF) 2 MG/ML IV SOLN
1.0000 mg | Freq: Four times a day (QID) | INTRAVENOUS | Status: DC | PRN
  Administered 2018-02-14 – 2018-02-15 (×2): 1 mg via INTRAVENOUS
  Filled 2018-02-14 (×2): qty 1

## 2018-02-14 MED ORDER — ORAL CARE MOUTH RINSE
15.0000 mL | Freq: Two times a day (BID) | OROMUCOSAL | Status: DC
  Administered 2018-02-14: 15 mL via OROMUCOSAL

## 2018-02-14 MED ORDER — CARVEDILOL 25 MG PO TABS
25.0000 mg | ORAL_TABLET | Freq: Two times a day (BID) | ORAL | Status: DC
  Filled 2018-02-14: qty 1

## 2018-02-14 NOTE — Progress Notes (Signed)
PT Cancellation Note  Patient Details Name: Phillip Sanchez MRN: [id number] DOB: 1928/01/12   Cancelled Treatment:    Reason Eval/Treat Not Completed: Fatigue/lethargy limiting ability to participate. Treatment attempted; pt soundly sleeping with visitors just leaving. Wished pt to rest. Re attempt as appropriate based on plan of care at a later date.   Larae Grooms, PTA 02/14/2018, 3:20 PM

## 2018-02-14 NOTE — Progress Notes (Signed)
PT Cancellation Note  Patient Details Name: Phillip Sanchez MRN: [id number] DOB: [birth year]   Cancelled Treatment:    Reason Eval/Treat Not Completed: (Per chart review, patient with minimal improvement over past 24 hours; family considering transition to hospice home if improvement remains limited.  Will hold this date to ensure medical stability and clearly established POC.)   Kye Silverstein H. Owens Shark, PT, DPT, NCS 02/14/18, 1:09 PM [phone]

## 2018-02-14 NOTE — Clinical Social Work Note (Signed)
CSW spoke with patient's son Phillip Sanchez at bedside regarding disposition. Son states that they are still monitoring patient to decide which facility would be best for him. Son reports that currently it appears that patient is not progressing and they would be interested in [facility] hospice home if this continues. CSW asked son if patient does improve would he want to pursue SNF. Son states that he is unsure right now and really feels that patient is more appropriate for hospice home. Son states that he would like to see how patient does over the next 24 hours before making a decision. CSW notified Santiago Glad, hospice liaison of referral. CSW will follow up with patient to determine final disposition.   [place], [place]

## 2018-02-14 NOTE — Plan of Care (Signed)
  Problem: Education: Goal: Knowledge of General Education information will improve Description Including pain rating scale, medication(s)/side effects and non-pharmacologic comfort measures Outcome: Progressing   Problem: Clinical Measurements: Goal: Respiratory complications will improve Outcome: Progressing   Problem: Safety: Goal: Ability to remain free from injury will improve Outcome: Progressing   Problem: Skin Integrity: Goal: Risk for impaired skin integrity will decrease Outcome: Progressing

## 2018-02-14 NOTE — Progress Notes (Signed)
Central telemetry [place]) called to notify of patient's change in heart rhythm and stated she saved/sent the strip. Central tele number provided when MD notified.

## 2018-02-14 NOTE — Progress Notes (Signed)
New hospice home referral received from [facility] with the understanding that family wishes to wait another 24 hrs to make a final decision about hospice home vs SNF. No hospice home bed availability at this time. Plan is for follow up with CSW on 1/2. Thank you. Flo Shanks RN, BSN, [facility] [place], [facility] [phone]

## 2018-02-14 NOTE — Progress Notes (Signed)
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 He has had multiple recent falls suffering rib fractures and a large hematoma above the L. Eye. Etiology of AMS is unclear but patient is being treated for a UTI. Patient was referred to palliative care to address goals.   CODE STATUS: DNR  PAST MEDICAL HISTORY: Past Medical History:  Diagnosis Date  . Anemia   . CHF (congestive heart failure) ([place])   . Colon cancer [facility])    He states they removed cancerous polyps.  . Hypercholesteremia   . Hypertension   . Lung cancer ([place])   . Myocardial infarction ([place])   . Osteoarthritis   . Prostate cancer [facility])    Prostatectomy.  . Small cell lung cancer ([place]) 08/04/2017    PAST SURGICAL HISTORY:  Past Surgical History:  Procedure Laterality Date  . COLON SURGERY    . CORONARY ANGIOPLASTY WITH STENT PLACEMENT    . herniorrhaphy    . PORTA CATH INSERTION N/A 08/05/2017   Procedure: PORTA CATH INSERTION;  Surgeon: Katha Cabal, MD;  Location: [place] [facility];  Service: Cardiovascular;  Laterality: N/A;  . prostactectomy      HEMATOLOGY/ONCOLOGY HISTORY:  Oncology History   # 2015- [facility] [place]  ca stage I s/p SBRT [Dr.Crystal]  # 3rd June 2019- Multiple liver lesions- s/p Bx- SMALL CELL of lung origin; STAGE IV;  # June 26th- carbo-etop-atezo  # NOV 17th 2019-more than 20 brain lesions the largest 1.2 mm right cerebellum-recommend whole brain radiation [finish [id number]]  # Prostate cancer [ 79 years ago]; s/p surgery  # colon cancer [s/p resection of malignant polyps; Dr/Elliot; No colectomy]  # hx CAD [asprin/plavix]; hx PVD  -----------------------------------------------   DIAGNOSIS: Small cell lung cancer  STAGE:  IV   ;GOALS: Palliative  CURRENT/MOST RECENT THERAPY: carbo-etop-Atezo      Cancer of lower lobe of left lung ([place])   11/22/2017 -  Chemotherapy    The patient had atezolizumab (TECENTRIQ) 1,200 mg in sodium chloride 0.9 % 250 mL chemo infusion, 1,200 mg, Intravenous, Once, 2 of 6 cycles Administration: 1,200 mg (11/23/2017), 1,200 mg (12/14/2017)  for chemotherapy treatment.      Small cell lung cancer ([place])   08/04/2017 Initial Diagnosis    Small cell lung cancer ([place])    08/04/2017 - 11/17/2017 Chemotherapy    The patient had palonosetron (ALOXI) injection 0.25 mg, 0.25 mg, Intravenous,  Once, 4 of 4 cycles Administration: 0.25 mg (08/10/2017), 0.25 mg (09/07/2017), 0.25 mg (10/04/2017), 0.25 mg (10/26/2017) pegfilgrastim-cbqv (UDENYCA) injection 6 mg, 6 mg, Subcutaneous, Once, 4 of 4 cycles Administration: 6 mg (08/15/2017), 6 mg (10/07/2017), 6 mg (10/31/2017) CARBOplatin (PARAPLATIN) 340 mg in sodium chloride 0.9 % 250 mL chemo infusion, 340 mg (100 % of original dose 340.5 mg),  Intravenous,  Once, 4 of 4 cycles Dose modification:   (original dose 340.5 mg, Cycle 1),   (original dose 431 mg, Cycle 2) Administration: 340 mg (08/10/2017), 430 mg (09/07/2017), 430 mg (10/04/2017), 430 mg (10/26/2017) etoposide (VEPESID) 200 mg in sodium chloride 0.9 % 500 mL chemo infusion, 210 mg, Intravenous,  Once, 4 of 4 cycles Dose modification: 80 mg/m2 (original dose  100 mg/m2, Cycle 3, Reason: Provider Judgment) Administration: 200 mg (08/10/2017), 200 mg (08/11/2017), 200 mg (08/12/2017), 200 mg (09/07/2017), 200 mg (09/08/2017), 160 mg (10/04/2017), 160 mg (10/05/2017), 160 mg (10/06/2017), 160 mg (10/26/2017), 160 mg (10/27/2017), 160 mg (10/28/2017) fosaprepitant (EMEND) 150 mg, dexamethasone (DECADRON) 12 mg in sodium chloride 0.9 % 145 mL IVPB, , Intravenous,  Once, 4 of 4 cycles Administration:  (08/10/2017),  (09/07/2017),  (10/04/2017),  (10/26/2017) atezolizumab (TECENTRIQ) 1,200 mg in sodium chloride 0.9 % 250 mL chemo infusion, 1,200 mg, Intravenous, Once, 4 of 4 cycles Administration: 1,200 mg (08/10/2017), 1,200 mg (09/07/2017), 1,200 mg (10/04/2017), 1,200 mg (10/26/2017)  for chemotherapy treatment.      ALLERGIES:  has No Known Allergies.  MEDICATIONS:  Current Facility-Administered Medications  Medication Dose Route Frequency Provider Last Rate Last Dose  . acetaminophen (TYLENOL) tablet 650 mg  650 mg Oral Q6H PRN Loletha Grayer, MD   650 mg at 02/13/18 1008   Or  . acetaminophen (TYLENOL) suppository 650 mg  650 mg Rectal Q6H PRN Loletha Grayer, MD      . aspirin EC tablet 81 mg  81 mg Oral Daily Loletha Grayer, MD   81 mg at 02/14/18 1000  . bisacodyl (DULCOLAX) EC tablet 5 mg  5 mg Oral Daily PRN Loletha Grayer, MD      . carvedilol (COREG) tablet 25 mg  25 mg Oral BID WC Demetrios Loll, MD      . cefTRIAXone (ROCEPHIN) 1 g in sodium chloride 0.9 % 100 mL IVPB  1 g Intravenous Q24H Loletha Grayer, MD   Stopped at 02/13/18 1906  . feeding supplement (ENSURE ENLIVE) (ENSURE ENLIVE) liquid 237 mL  237 mL Oral BID BM Demetrios Loll, MD      . loratadine (CLARITIN) tablet 10 mg  10 mg Oral Daily Loletha Grayer, MD   10 mg at 02/14/18 1000  . MEDLINE mouth rinse  15 mL Mouth Rinse BID Loletha Grayer, MD   15 mL at 02/13/18 2304  . MEDLINE mouth rinse  15 mL Mouth Rinse BID Demetrios Loll, MD   15 mL at 02/14/18 1001  . ondansetron (ZOFRAN) tablet 4 mg   4 mg Oral Q6H PRN Loletha Grayer, MD       Or  . ondansetron [facility]) injection 4 mg  4 mg Intravenous Q6H PRN Wieting, Richard, MD      . oxyCODONE (Oxy IR/ROXICODONE) immediate release tablet 5 mg  5 mg Oral Q4H PRN Loletha Grayer, MD   5 mg at 02/14/18 1000  . polyethylene glycol (MIRALAX / GLYCOLAX) packet 17 g  17 g Oral Daily PRN Loletha Grayer, MD      . QUEtiapine (SEROQUEL) tablet 25 mg  25 mg Oral QHS PRN Lance Coon, MD   25 mg at 02/14/18 0221    VITAL SIGNS: BP (!) 127/48 (BP Location: Right Arm)   Pulse (!) 52   Temp 97.6 F (36.4 C) (Oral)   Resp 18   Ht 5\' 10"  (1.778 m)   Wt 169 lb 12.1 oz (77 kg)   SpO2 95%   BMI 24.36  kg/m  Filed Weights   02/09/2018 1439  Weight: 169 lb 12.1 oz (77 kg)    Estimated body mass index is 24.36 kg/m as calculated from the following:   Height as of this encounter: 5\' 10"  (1.778 m).   Weight as of this encounter: 169 lb 12.1 oz (77 kg).  LABS: CBC:    Component Value Date/Time   WBC 14.7 (H) 02/14/2018 0617   HGB 8.3 (L) 02/14/2018 0617   HGB 14.3 01/24/2014 1057   HCT 25.8 (L) 02/14/2018 0617   HCT 42.7 01/24/2014 1057   PLT 170 02/14/2018 0617   PLT 218 01/24/2014 1057   MCV 106.6 (H) 02/14/2018 0617   MCV 99 01/24/2014 1057   NEUTROABS 10.4 (H) 02/02/2018 1449   NEUTROABS 4.4 01/24/2014 1057   LYMPHSABS 0.3 (L) 01/19/2018 1449   LYMPHSABS 1.5 01/24/2014 1057   MONOABS 1.5 (H) 01/29/2018 1449   MONOABS 0.8 01/24/2014 1057   EOSABS 0.2 02/04/2018 1449   EOSABS 0.3 01/24/2014 1057   BASOSABS 0.1 02/02/2018 1449   BASOSABS 0.1 01/24/2014 1057   Comprehensive Metabolic Panel:    Component Value Date/Time   NA 143 02/14/2018 0617   NA 143 01/24/2014 1057   K 3.6 02/14/2018 0617   K 3.9 01/24/2014 1057   CL 109 02/14/2018 0617   CL 106 01/24/2014 1057   CO2 24 02/14/2018 0617   CO2 27 01/24/2014 1057   BUN 60 (H) 02/14/2018 0617   BUN 24 (H) 01/24/2014 1057   CREATININE 1.03 02/14/2018 0617   CREATININE  1.19 01/24/2014 1057   GLUCOSE 204 (H) 02/14/2018 0617   GLUCOSE 105 (H) 01/24/2014 1057   CALCIUM 8.1 (L) 02/14/2018 0617   CALCIUM 8.9 01/24/2014 1057   AST 64 (H) 02/09/2018 0948   AST 19 01/24/2014 1057   ALT 40 02/09/2018 0948   ALT 25 01/24/2014 1057   ALKPHOS 75 02/09/2018 0948   ALKPHOS 73 01/24/2014 1057   BILITOT 1.0 02/09/2018 0948   BILITOT 0.6 01/24/2014 1057   PROT 5.9 (L) 02/09/2018 0948   PROT 6.8 01/24/2014 1057   ALBUMIN 3.1 (L) 02/09/2018 0948   ALBUMIN 3.5 01/24/2014 1057    RADIOGRAPHIC STUDIES: Ct Head Wo Contrast  Result Date: 02/09/2018 CLINICAL DATA:  84 year old male status post fall in bathroom overnight. Left eye laceration and swelling. Metastatic lung cancer with numerous small brain metastases recently on November restaging MRI. EXAM: CT HEAD WITHOUT CONTRAST CT MAXILLOFACIAL WITHOUT CONTRAST TECHNIQUE: Multidetector CT imaging of the head and maxillofacial structures were performed using the standard protocol without intravenous contrast. Multiplanar CT image reconstructions of the maxillofacial structures were also generated. COMPARISON:  Brain MRI 12/31/2017 and earlier. FINDINGS: CT HEAD FINDINGS Brain: No intracranial mass effect. No vasogenic edema is evident by CT. Mild for age cerebral white matter hypodensity has increased since 2015. No acute intracranial hemorrhage identified. No ventriculomegaly. No cortically based acute infarct identified. Vascular: Calcified atherosclerosis at the skull base. No suspicious intracranial vascular hyperdensity. Skull: Stable and intact. Other: Tympanic cavities and mastoids are clear. Left periorbital findings are below. The other scalp soft tissues remain within normal limits. CT MAXILLOFACIAL FINDINGS Osseous: Small 4-5 millimeter lucent lesion along the ventral left mandible condyle is new since 2015 (series 4, image 26). Mandible otherwise intact. Supernumerary midline mandible tooth again noted. Maxilla and  zygoma appear intact. Central skull base intact. Advanced cervical spine degeneration, the visible cervical spine appears stable since 2015. Orbits: Broad-based left periorbital superficial soft tissue hematoma  up to 14 millimeters in thickness. The left globe remains intact. Left intraorbital soft tissues remain normal. Bilateral orbital walls remain intact. Right orbit is normal. Sinuses: Visualized paranasal sinuses and mastoids are stable and well pneumatized. Soft tissues: Visible noncontrast larynx, pharynx, parapharyngeal spaces, retropharyngeal space, sublingual space, submandibular spaces, parotid spaces and masticator spaces are within normal limits. No upper cervical lymphadenopathy. IMPRESSION: 1. Superficial left periorbital soft tissue hematoma without intraorbital injury. 2. No acute intracranial abnormality. Numerous small brain metastases demonstrated by MRI in November are not evident on noncontrast CT. 3. No face or skull fracture identified. Small 4-5 mm lucent lesion at the left mandible condyle is new since 2015 and could be degenerative but a small bone metastasis is not excluded. Electronically Signed   By: Genevie Ann M.D.   On: 02/09/2018 09:57   Mr Jeri Cos KD Contrast  Result Date: 01/23/2018 CLINICAL DATA:  Recent fall with confusion. History of lung cancer with brain metastases. EXAM: MRI HEAD WITHOUT AND WITH CONTRAST TECHNIQUE: Multiplanar, multiecho pulse sequences of the brain and surrounding structures were obtained without and with intravenous contrast. CONTRAST:  8 mL Gadavist COMPARISON:  Head CT 02/09/2018 and MRI 12/31/2017 FINDINGS: Due to patient confusion, there is progressive, moderate motion artifact during the examination. A sagittal T1 postcontrast sequence was not obtained. Brain: There is no evidence of acute infarct, midline shift, or extra-axial fluid collection. Mild cerebral atrophy is within normal limits for age. Patchy T2 hyperintensities in the cerebral white  matter are similar to the prior MRI and nonspecific but compatible with chronic small vessel ischemic disease, relatively mild for age. Scattered foci of chronic microhemorrhage are again seen in both cerebral hemispheres and right cerebellum, at least some of which are associated with known metastases. The vast majority of the small enhancing brain metastases on the prior study are not identified on the current examination, however this may reflect both a decreased size of some of the lesions as well as obscuration of some lesions due to the extent of motion artifact. A 2 mm enhancing lesion in the inferior left cerebellar tonsil is grossly unchanged (series 15, image 4). The 13 mm right cerebellar lesion on the prior study has decreased in size with at most a punctate focus of enhancement remaining. There is susceptibility artifact associated with the left caudate lesion without definite residual enhancement. A 3 mm enhancing lesion is questioned in the right frontal lobe near the anterior aspect of the insula on the coronal series (series 16, image 21), not confirmed on the axial sequence and possibly artifactual. Right cerebellar and left occipital edema has resolved, and no new areas of significant edema are identified. Vascular: Major intracranial vascular flow voids are grossly preserved. Skull and upper cervical spine: Unremarkable bone marrow signal. Sinuses/Orbits: Unremarkable orbits. Paranasal sinuses and mastoid air cells are clear. Other: None. IMPRESSION: 1. Motion degraded examination. No evidence of acute intracranial abnormality. 2. Nonvisualization of the majority of the previously seen metastases. Some of the metastases have clearly decreased in size while others may be obscured by the motion artifact. 3. New 3 mm right frontal metastasis versus artifact. 4. No residual or new vasogenic edema. Electronically Signed   By: Logan Bores M.D.   On: 01/18/2018 19:38   Ct Maxillofacial Wo  Contrast  Result Date: 02/09/2018 CLINICAL DATA:  84 year old male status post fall in bathroom overnight. Left eye laceration and swelling. Metastatic lung cancer with numerous small brain metastases recently on November restaging MRI. EXAM:  CT HEAD WITHOUT CONTRAST CT MAXILLOFACIAL WITHOUT CONTRAST TECHNIQUE: Multidetector CT imaging of the head and maxillofacial structures were performed using the standard protocol without intravenous contrast. Multiplanar CT image reconstructions of the maxillofacial structures were also generated. COMPARISON:  Brain MRI 12/31/2017 and earlier. FINDINGS: CT HEAD FINDINGS Brain: No intracranial mass effect. No vasogenic edema is evident by CT. Mild for age cerebral white matter hypodensity has increased since 2015. No acute intracranial hemorrhage identified. No ventriculomegaly. No cortically based acute infarct identified. Vascular: Calcified atherosclerosis at the skull base. No suspicious intracranial vascular hyperdensity. Skull: Stable and intact. Other: Tympanic cavities and mastoids are clear. Left periorbital findings are below. The other scalp soft tissues remain within normal limits. CT MAXILLOFACIAL FINDINGS Osseous: Small 4-5 millimeter lucent lesion along the ventral left mandible condyle is new since 2015 (series 4, image 26). Mandible otherwise intact. Supernumerary midline mandible tooth again noted. Maxilla and zygoma appear intact. Central skull base intact. Advanced cervical spine degeneration, the visible cervical spine appears stable since 2015. Orbits: Broad-based left periorbital superficial soft tissue hematoma up to 14 millimeters in thickness. The left globe remains intact. Left intraorbital soft tissues remain normal. Bilateral orbital walls remain intact. Right orbit is normal. Sinuses: Visualized paranasal sinuses and mastoids are stable and well pneumatized. Soft tissues: Visible noncontrast larynx, pharynx, parapharyngeal spaces, retropharyngeal  space, sublingual space, submandibular spaces, parotid spaces and masticator spaces are within normal limits. No upper cervical lymphadenopathy. IMPRESSION: 1. Superficial left periorbital soft tissue hematoma without intraorbital injury. 2. No acute intracranial abnormality. Numerous small brain metastases demonstrated by MRI in November are not evident on noncontrast CT. 3. No face or skull fracture identified. Small 4-5 mm lucent lesion at the left mandible condyle is new since 2015 and could be degenerative but a small bone metastasis is not excluded. Electronically Signed   By: Genevie Ann M.D.   On: 02/09/2018 09:57    PERFORMANCE STATUS (ECOG) : 4 - Bedbound  Review of Systems As noted above. Otherwise, a complete review of systems is negative.  Physical Exam General: ill appearing HEENT: large periorbital bruising to L. eye Cardiovascular: regular rate and rhythm Pulmonary: clear ant fields Abdomen: soft, nontender, + bowel sounds GU: no suprapubic tenderness Extremities: no edema, no joint deformities Skin: no rashes Neurological: lethargic, wakes briefly when stimulated  IMPRESSION: Patient remains lethargic. Son at bedside.   I spoke again with patient's son. He agrees that patient has had no significant improvement in past 24 hours. He expressed a desire for transfer to the [facility] if patient continues to do poorly.   Case discussed with SW.   Will follow.   PLAN: Continue current scope of treatment Family are interested in [place] if patient shows no significant improvement   Time Total: 20 minutes  Visit consisted of counseling and education dealing with the complex and emotionally intense issues of symptom management and palliative care in the setting of serious and potentially life-threatening illness.Greater than 50%  of this time was spent counseling and coordinating care related to the above assessment and plan.  Signed by: Altha Harm, PhD,  NP-C [phone] (Work Cell)

## 2018-02-14 NOTE — Progress Notes (Signed)
[facility] [place] NAME: Phillip Sanchez    MR#:  [id number]  DATE OF BIRTH:  [birth year]  SUBJECTIVE:  CHIEF COMPLAINT:   Chief Complaint  Patient presents with  . Failure To Thrive   The patient is lethargic, confused. REVIEW OF SYSTEMS:  Review of Systems  Unable to perform ROS: Mental status change    DRUG ALLERGIES:  No Known Allergies VITALS:  Blood pressure (!) 155/61, pulse (!) 50, temperature 98.7 F (37.1 C), temperature source Oral, resp. rate 18, height 5\' 10"  (1.778 m), weight 77 kg, SpO2 95 %. PHYSICAL EXAMINATION:  Physical Exam Constitutional:      General: He is not in acute distress. Eyes:     General: No scleral icterus.    Conjunctiva/sclera: Conjunctivae normal.     Pupils: Pupils are equal, round, and reactive to light.     Comments: hematoma over his left eye  Neck:     Musculoskeletal: Normal range of motion and neck supple.     Vascular: No JVD.     Trachea: No tracheal deviation.  Cardiovascular:     Rate and Rhythm: Normal rate and regular rhythm.     Heart sounds: Normal heart sounds. No murmur. No gallop.   Pulmonary:     Effort: Pulmonary effort is normal. No respiratory distress.     Breath sounds: Normal breath sounds. No wheezing or rales.  Abdominal:     General: Bowel sounds are normal. There is no distension.     Palpations: Abdomen is soft.     Tenderness: There is no abdominal tenderness. There is no rebound.  Musculoskeletal: Normal range of motion.        General: No tenderness.     Right lower leg: No edema.     Left lower leg: No edema.  Skin:    Findings: No erythema or rash.     Comments: hematoma over left side of his body  Neurological:     General: No focal deficit present.     Mental Status: He is alert and oriented to person, place, and time.     Cranial Nerves: No cranial nerve deficit.  Psychiatric:     Comments: Confused.    LABORATORY PANEL:   Male CBC Recent Labs  Lab 02/14/18 0617  WBC 14.7*  HGB 8.3*  HCT 25.8*  PLT 170   ------------------------------------------------------------------------------------------------------------------ Chemistries  Recent Labs  Lab 02/09/18 0948  02/14/18 0617  NA 141   < > 143  K 3.5   < > 3.6  CL 109   < > 109  CO2 24   < > 24  GLUCOSE 107*   < > 204*  BUN 44*   < > 60*  CREATININE 1.20   < > 1.03  CALCIUM 8.6*   < > 8.1*  MG  --   --  2.6*  AST 64*  --   --   ALT 40  --   --   ALKPHOS 75  --   --   BILITOT 1.0  --   --    < > = values in this interval not displayed.   RADIOLOGY:  No results found. ASSESSMENT AND PLAN:  1.  Acute metabolic encephalopathy.  Likely from acute cystitis with leukocytosis.    Continue Rocephin and follow-up urine culture (E Coli).  2.  Small cell lung cancer metastatic to liver and the brain.  Overall prognosis is poor.  MRI  of the brain show smaller metastasis.  He is status post radiation and chemotherapy.  3.  Elevated troponin likely demand ischemia. 4.  Hypertension.    Resumed home dose Coreg and Lotrel due to elevated blood pressure and tachycardia.  5. Sinus rhythm with 2nd degree A-V block (Mobitz II) with occasional Premature ventricular complexes per EKG. Hold coreg.  He is not a good candidate for pacemaker.  Dehydration.  Continue IV fluid support. Anemia of chronic disease.  Stable. Moderate Malnutrition related to chronic illness.  Follow dietitian's recommendation.  Very poor prognosis, palliative care staff [name] discussed with family members and made DNR. The plan is to  continue current scope of treatment Family are interested in [place] if patient shows no significant improvement.  Discussed with [name]. all the records are reviewed and case discussed with Care Management/Social Worker. Management plans discussed with the patient, his 2 sons, and they are in agreement.  CODE STATUS: DNR  TOTAL  TIME TAKING CARE OF THIS PATIENT: 28 minutes.   More than 50% of the time was spent in counseling/coordination of care: YES  POSSIBLE D/C IN ? DAYS, DEPENDING ON CLINICAL CONDITION.   Demetrios Loll M.D on 02/14/2018 at 2:57 PM  Between 7am to 6pm - Pager - [phone]  After 6pm go to [email or web address] - [facility]

## 2018-02-15 ENCOUNTER — Inpatient Hospital Stay: Payer: Medicare Other

## 2018-02-15 LAB — BASIC METABOLIC PANEL
Anion gap: 13 (ref 5–15)
BUN: 99 mg/dL — ABNORMAL HIGH (ref 8–23)
CHLORIDE: 113 mmol/L — AB (ref 98–111)
CO2: 21 mmol/L — ABNORMAL LOW (ref 22–32)
Calcium: 8.2 mg/dL — ABNORMAL LOW (ref 8.9–10.3)
Creatinine, Ser: 1.33 mg/dL — ABNORMAL HIGH (ref 0.61–1.24)
GFR calc Af Amer: 55 mL/min — ABNORMAL LOW (ref >60)
GFR calc non Af Amer: 47 mL/min — ABNORMAL LOW (ref >60)
Glucose, Bld: 185 mg/dL — ABNORMAL HIGH (ref 70–99)
POTASSIUM: 3.8 mmol/L (ref 3.5–5.1)
SODIUM: 147 mmol/L — AB (ref 135–145)

## 2018-02-15 LAB — CBC
HEMATOCRIT: 24.6 % — AB (ref 39.0–52.0)
HEMOGLOBIN: 7.7 g/dL — AB (ref 13.0–17.0)
MCH: 34.8 pg — ABNORMAL HIGH (ref 26.0–34.0)
MCHC: 31.3 g/dL (ref 30.0–36.0)
MCV: 111.3 fL — ABNORMAL HIGH (ref 80.0–100.0)
Platelets: 157 10*3/uL (ref 150–400)
RBC: 2.21 MIL/uL — ABNORMAL LOW (ref 4.22–5.81)
RDW: 15 % (ref 11.5–15.5)
WBC: 15.5 10*3/uL — ABNORMAL HIGH (ref 4.0–10.5)
nRBC: 0 % (ref 0.0–0.2)

## 2018-02-15 LAB — URINE CULTURE

## 2018-02-15 MED ORDER — HYDROMORPHONE HCL 1 MG/ML IJ SOLN
0.5000 mg | Freq: Once | INTRAMUSCULAR | Status: AC
  Administered 2018-02-15: 15:00:00 0.5 mg via INTRAVENOUS
  Filled 2018-02-15: qty 1

## 2018-02-15 MED ORDER — GLYCOPYRROLATE 0.2 MG/ML IJ SOLN
0.4000 mg | INTRAMUSCULAR | Status: DC
  Administered 2018-02-15: 0.4 mg via INTRAVENOUS
  Filled 2018-02-15: qty 2

## 2018-02-15 MED ORDER — HALOPERIDOL LACTATE 5 MG/ML IJ SOLN: 2.0000 mg | Freq: Four times a day (QID) | INTRAMUSCULAR | Status: DC | PRN

## 2018-02-15 MED ORDER — OXYCODONE HCL 5 MG PO TABS: 5.0000 mg | ORAL_TABLET | ORAL | Status: DC | PRN

## 2018-02-15 MED ORDER — HYDROMORPHONE HCL 1 MG/ML IJ SOLN
0.5000 mg | INTRAMUSCULAR | Status: DC | PRN
  Administered 2018-02-15: 16:00:00 1 mg via INTRAVENOUS
  Filled 2018-02-15: qty 1

## 2018-02-15 MED ORDER — DEXTROSE-NACL 5-0.45 % IV SOLN
INTRAVENOUS | Status: DC
  Administered 2018-02-15: 11:00:00 via INTRAVENOUS

## 2018-02-15 MED ORDER — HALOPERIDOL LACTATE 5 MG/ML IJ SOLN
2.0000 mg | Freq: Four times a day (QID) | INTRAMUSCULAR | Status: DC | PRN
  Administered 2018-02-15: 15:00:00 2 mg via INTRAVENOUS
  Filled 2018-02-15 (×2): qty 1

## 2018-02-15 MED ORDER — CALCIUM GLUCONATE-NACL 1-0.675 GM/50ML-% IV SOLN
1.0000 g | Freq: Once | INTRAVENOUS | Status: AC
  Administered 2018-02-15: 02:00:00 1000 mg via INTRAVENOUS
  Filled 2018-02-15 (×2): qty 50

## 2018-02-15 MED ORDER — GLYCOPYRROLATE 0.2 MG/ML IJ SOLN
0.4000 mg | Freq: Once | INTRAMUSCULAR | Status: AC
  Administered 2018-02-15: 16:00:00 0.4 mg via INTRAVENOUS
  Filled 2018-02-15: qty 2

## 2018-02-15 MED ORDER — MORPHINE SULFATE (PF) 2 MG/ML IV SOLN
1.0000 mg | INTRAVENOUS | Status: DC | PRN
  Administered 2018-02-15: 15:00:00 2 mg via INTRAVENOUS
  Filled 2018-02-15 (×3): qty 1

## 2018-02-15 DEATH — deceased

## 2018-02-22 ENCOUNTER — Ambulatory Visit: Payer: Medicare Other

## 2018-02-22 ENCOUNTER — Ambulatory Visit: Payer: Medicare Other | Admitting: Occupational Therapy

## 2018-02-22 ENCOUNTER — Other Ambulatory Visit: Payer: Medicare Other

## 2018-02-22 ENCOUNTER — Encounter: Payer: Medicare Other | Admitting: Hospice and Palliative Medicine

## 2018-02-22 ENCOUNTER — Ambulatory Visit: Payer: Medicare Other | Admitting: Internal Medicine

## 2018-03-03 ENCOUNTER — Ambulatory Visit: Payer: Medicare Other | Admitting: Radiation Oncology

## 2018-03-18 NOTE — Progress Notes (Addendum)
Palliative:  HPI: 85 yo male with significant medical history or stage IV small cell lung cancer with mets to liver, bone, and brain s/p chemo and XRT. Past history for colon and prostate cancer, CHF EF 40-45%. S/P recent fall with left rib fractures. Came to hospital and admitted 02/13/2018 with trouble urinating and found to have UTI along with lethargy and confusion. CT abd/pelvis evident for cancer progression. He has made an acute decline today 2018/03/10.   I was called to bedside by RN for decline in status and unmanaged symptoms. I arrived at bedside and Phillip Sanchez is in severe distress with respiratory distress with severe pain and agitation. His brother is at bedside and I explain my goal to first and foremost get his brother a little more comfortable. Once haldol and dilaudid IV given by RN per my order Phillip Sanchez did receive some relief. I spoke more with brother who tells me that Phillip Sanchez, Phillip Sanchez, is on his way to the hospital. I explained that this is good because I feel that time is very limited and I explained that I believe any family that should come should be notified. We called Phillip Sanchez who is on his way. Brother was calling more family. I confirmed goal for comfort with Phillip Sanchez.   I continued to work with RN to ensure comfort with orders for comfort care with prn dilaudid, haldol, glycopyrrolate. Sons, Phillip Sanchez, came to bedside. I explained situation and that Phillip Sanchez is actively dying and time is short. I was present at bedside when Mr. Ing died and pronounced death with Phillip Server, RN at [phone]. Family at bedside and provided emotional support. Chaplain arrived at bedside to provide support.   Time of death: 2122  60 min  Vinie Sill, NP Palliative Medicine Team Pager # [phone] (M-F 8a-5p) Team Phone # [phone] (Nights/Weekends)

## 2018-03-18 NOTE — Plan of Care (Signed)
Pt started exhibiting distress at approx 14:45.  Started giving morphine.  Don't think he got b/c IV appeared to be leaking and port was accessed and 2 mg morphine immediately given.  Pt was very restless.  Called Vinie Sill, Palliative NP.  Explained patient was in distress, possibly terminal restlessness and she came immediately.  Put in orders for haldol, dilaudid and glycopyrolate.  We also put pt on O2 for comfort.  Pt's brother was present.  Pt/s brother called Pt's son and son was on his way. Pt's breathing changed and we knew he was actively dying.  Pt became very peaceful; family present and coping well.  Pt passed at 16:07 and was pronounced by Vinie Sill, Palliative NP.  [facility], Dr and chaplain was paged.  COPA was called and he was turned down as a donor.

## 2018-03-18 NOTE — Progress Notes (Signed)
[facility] at [place] NAME: Phillip Sanchez    MR#:  [id number]  DATE OF BIRTH:  [birth year]  SUBJECTIVE:  CHIEF COMPLAINT:   Chief Complaint  Patient presents with  . Failure To Thrive   The patient is lethargic, confused. REVIEW OF SYSTEMS:  Review of Systems  Unable to perform ROS: Mental status change    DRUG ALLERGIES:  No Known Allergies VITALS:  Blood pressure 112/69, pulse 85, temperature (!) 97.5 F (36.4 C), temperature source Oral, resp. rate 20, height 5\' 10"  (1.778 m), weight 77 kg, SpO2 99 %. PHYSICAL EXAMINATION:  Physical Exam Constitutional:      General: He is not in acute distress. Eyes:     General: No scleral icterus.    Conjunctiva/sclera: Conjunctivae normal.     Pupils: Pupils are equal, round, and reactive to light.     Comments: hematoma over his left eye  Neck:     Musculoskeletal: Normal range of motion and neck supple.     Vascular: No JVD.     Trachea: No tracheal deviation.  Cardiovascular:     Rate and Rhythm: Normal rate and regular rhythm.     Heart sounds: Normal heart sounds. No murmur. No gallop.   Pulmonary:     Effort: Pulmonary effort is normal. No respiratory distress.     Breath sounds: Normal breath sounds. No wheezing or rales.  Abdominal:     General: Bowel sounds are normal. There is no distension.     Palpations: Abdomen is soft.     Tenderness: There is abdominal tenderness. There is no rebound.  Musculoskeletal: Normal range of motion.        General: No tenderness.     Right lower leg: No edema.     Left lower leg: No edema.  Skin:    Findings: No erythema or rash.     Comments: hematoma over left side of his body  Neurological:     General: No focal deficit present.     Mental Status: He is alert and oriented to person, place, and time.     Cranial Nerves: No cranial nerve deficit.  Psychiatric:     Comments: Confused.    LABORATORY PANEL:  Male CBC Recent Labs    Lab 02-23-18 0344  WBC 15.5*  HGB 7.7*  HCT 24.6*  PLT 157   ------------------------------------------------------------------------------------------------------------------ Chemistries  Recent Labs  Lab 02/09/18 0948  02/14/18 0617 February 23, 2018 0344  NA 141   < > 143 147*  K 3.5   < > 3.6 3.8  CL 109   < > 109 113*  CO2 24   < > 24 21*  GLUCOSE 107*   < > 204* 185*  BUN 44*   < > 60* 99*  CREATININE 1.20   < > 1.03 1.33*  CALCIUM 8.6*   < > 8.1* 8.2*  MG  --   --  2.6*  --   AST 64*  --   --   --   ALT 40  --   --   --   ALKPHOS 75  --   --   --   BILITOT 1.0  --   --   --    < > = values in this interval not displayed.   RADIOLOGY:  Ct Abdomen Pelvis Wo Contrast  Result Date: 02-23-18 CLINICAL DATA:  distended. 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 Failure to thrive. Fatigue/lethargy limiting ability to participate with PO contrast. Pt was not alert in CT. BUN 99. Creatinine 1.3. EXAM: CT ABDOMEN AND PELVIS WITHOUT CONTRAST TECHNIQUE: Multidetector CT imaging of the abdomen and pelvis was performed following the standard protocol without IV contrast. COMPARISON:  CT chest 11/21/2017, CT abdomen 07/26/2017 rates FINDINGS: Lower chest: Interval increase in pleural effusions left greater than right. Coronary and aortic atheromatous calcifications. Dependent atelectasis/consolidation posteriorly in the visualized lung bases. Hepatobiliary: Interval progression of multiple low attenuation hepatic lesions with worsening distortion of the hepatic capsule and hepatomegaly. Gallbladder is nondilated. Pancreas: Unremarkable. No pancreatic ductal dilatation or surrounding inflammatory changes. Spleen: Normal in size without focal abnormality. Adrenals/Urinary Tract: Normal adrenals. Kidneys unremarkable. No hydronephrosis. Foley catheter partially decompresses the urinary bladder. Stomach/Bowel: Stomach is partially  distended by gas and fluid. The small bowel appears nondilated, unremarkable. Appendix not discretely identified. Moderate colonic fecal material. Scattered descending and sigmoid diverticula without significant adjacent inflammatory/edematous change or abscess. Vascular/Lymphatic: Moderate aortic calcified plaque with infrarenal ectasia up to 2.8 cm diameter. Patchy iliofemoral arterial calcified plaque without aneurysm. 2.5 cm portal adenopathy, previously 1.3 cm short axis diameter. 1.1 cm left Obturator adenopathy, previously 0.7 cm. Reproductive: Post prostatectomy. Other: Left inguinal hernia repair sutures. No ascites. No free air. Musculoskeletal: Sclerotic lesions in the left ischium, right ilium L2 and L4 vertebral bodies, new since previous, consistent with progressive osseous metastatic disease. Spondylitic changes in the lower lumbar spine. Left ninth and tenth rib fractures, new since previous. IMPRESSION: 1. Progression of metastatic disease in the liver, bones, portal and left obturator adenopathy. 2. Increase in pericardial effusion and pleural effusions, left greater than right Electronically Signed   By: Lucrezia Europe M.D.   On: 03-14-18 11:30   ASSESSMENT AND PLAN:  1.  Acute metabolic encephalopathy.  Likely from acute cystitis with leukocytosis.    Continue Rocephin and urine culture (E Coli).  2.  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  Overall prognosis is poor.  MRI of the brain show smaller metastasis.  He is status post radiation and chemotherapy. CT of the abdomen show multiple metastasis.  3.  Elevated troponin likely demand ischemia. 4.  Hypertension.    Resumed home dose Coreg and Lotrel due to elevated blood pressure and tachycardia.  5. Sinus rhythm with 2nd degree A-V block (Mobitz II) with occasional Premature ventricular complexes per EKG. Hold coreg.  He is not a good candidate for pacemaker.  Acute renal failure due to dehydration.   Worsening, continue IV fluid support. Anemia of chronic disease.  Stable. Moderate Malnutrition related to chronic illness.  Follow dietitian's recommendation.  Anemia of chronic disease.  Hemoglobin decreased to 7.7, possible due to IV fluid dilution.  Very poor prognosis, palliative care staff [name] discussed with family members and made DNR. The plan is to  continue current scope of treatment. Family are interested in [place] if patient shows no significant improvement.  all the records are reviewed and case discussed with Care Management/Social Worker. Management plans discussed with the patient, his 2 sons, and they are in agreement.  CODE STATUS: DNR  TOTAL TIME TAKING CARE OF THIS PATIENT: 25 minutes.   More than 50% of the time was spent in counseling/coordination of care: YES  POSSIBLE D/C IN 1-2 DAYS, DEPENDING ON CLINICAL CONDITION.   Demetrios Loll M.D on 14-Mar-2018 at 1:51 PM  Between 7am to 6pm - Pager -  [phone]  After 6pm go to [email or web address] - [facility]

## 2018-03-18 NOTE — Care Management Important Message (Signed)
Copy of signed Medicare IM left with patient and family in room.

## 2018-03-18 NOTE — Progress Notes (Signed)
   02-23-18 1600  Clinical Encounter Type  Visited With Patient and family together  Visit Type  (EOL)  Referral From Nurse  Spiritual Encounters  Spiritual Needs Grief support;Emotional;Prayer  Stress Factors  Family Stress Factors  (Patient's EOL.)   Chaplain's initial response to a page at 1533 was interrupted by a Code Stroke at 1536. Upon return to the patient's room, the patient had died. Chaplain offered a prayer for the family and patient.

## 2018-03-18 NOTE — Discharge Summary (Signed)
   [place] at [place] NAME: Phillip Sanchez    MR#:  [id number]  DATE OF BIRTH:  May 13, 1927  DATE OF ADMISSION:  01/16/2018   ADMITTING PHYSICIAN: Loletha Grayer, MD  DATE OF DEATH: 03/10/18. PRIMARY CARE PHYSICIAN: Idelle Crouch, MD   ADMISSION DIAGNOSIS:  Weakness [R53.1] Lower urinary tract infection [N39.0] DISCHARGE DIAGNOSIS:  Active Problems:   [name]   Acute metabolic encephalopathy   Palliative care encounter   Malnutrition of moderate degree  SECONDARY DIAGNOSIS:   Past Medical History:  Diagnosis Date  . Anemia   . CHF (congestive heart failure) ([place])   . Colon cancer [facility])    He states they removed cancerous polyps.  . Hypercholesteremia   . Hypertension   . Lung cancer ([place])   . Myocardial infarction ([place])   . Osteoarthritis   . Prostate cancer [facility])    Prostatectomy.  . Small cell lung cancer ([place]) 08/04/2017   HOSPITAL COURSE:  1.Acute metabolic encephalopathy. Likely from acute cystitis with leukocytosis.    He was treated with Rocephin and urine culture (E Coli).  2. 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 Overall prognosis is poor.  MRI of the brain show smaller metastasis.  He is status post radiation and chemotherapy. CT of the abdomen show multiple metastasis.  3. Elevated troponin likely demand ischemia. 4. Hypertension. He was on home dose Coreg and Lotrel due to elevated blood pressure and tachycardia.  5. Sinus rhythm with 2nd degree A-V block (Mobitz II) with occasional Premature ventricular complexes per EKG. Hold coreg.  He is not a good candidate for pacemaker.  Acute renal failure due to dehydration.  Worsening, he was treated with IV fluid support.  Anemia of chronic disease. Moderate Malnutritionrelated to chronic illness. Anemia of chronic disease.  Hemoglobin decreased to 7.7, possible due to IV fluid  dilution.  Very poor prognosis, palliative care staff [name] discussed with family members and made DNR. Family are interested in [place] if patient shows no significant improvement.  The patient expired at the 16:07 today.

## 2018-03-18 DEATH — deceased

## 2019-05-18 IMAGING — CT CT ABD-PELV W/ CM
2 of 5 series · 14 of 46 positions shown, 16 images · IV contrast (omnipaque)
Comparison: July 18, 2017

CLINICAL DATA: Abdominal pain and distension. Shortness of breath.
Reported history of lung, colon, and prostate carcinoma

EXAM:
CT ABDOMEN AND PELVIS WITH CONTRAST
TECHNIQUE: Multidetector CT imaging of the abdomen and pelvis was performed
using the standard protocol following bolus administration of
intravenous contrast.
CONTRAST:  100mL OMNIPAQUE IOHEXOL 300 MG/ML  SOLN

[Series 2: routine abd/pel with · axial · 0.77mm/px · z∈[-745,-300]mm · 11 of 101 slices shown, 13 images]
[im 6/101  soft-tissue]
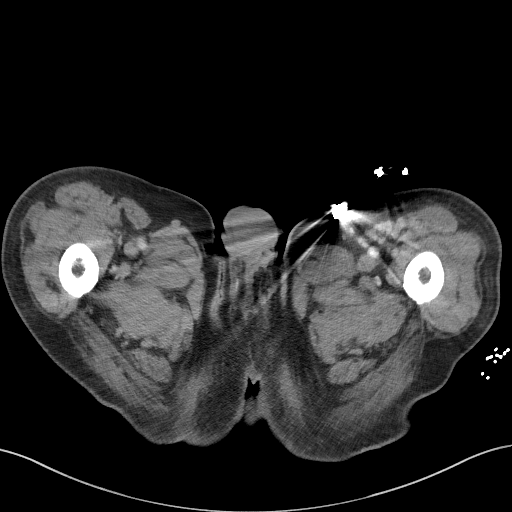
[im 6/101  bone]
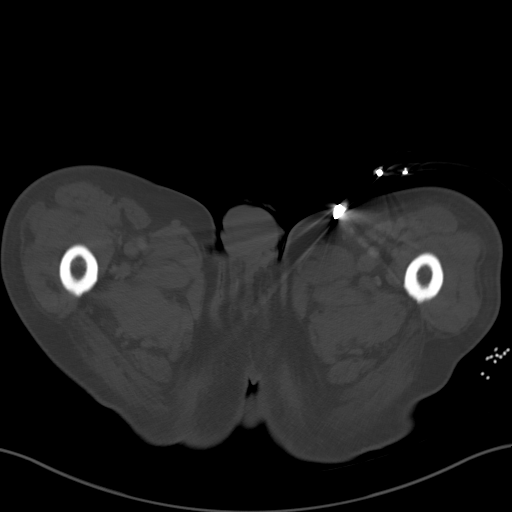
[im 18/101  soft-tissue]
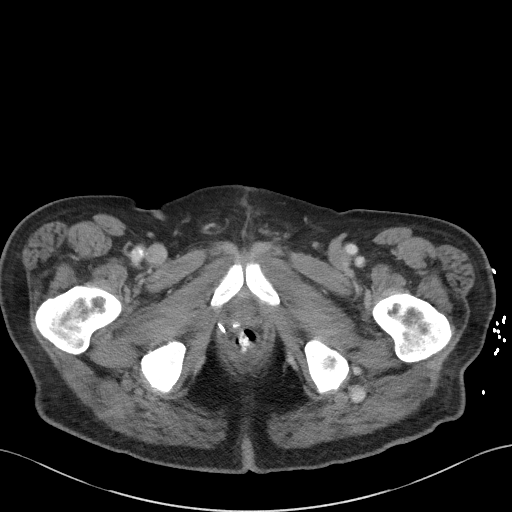
[im 24/101  soft-tissue]
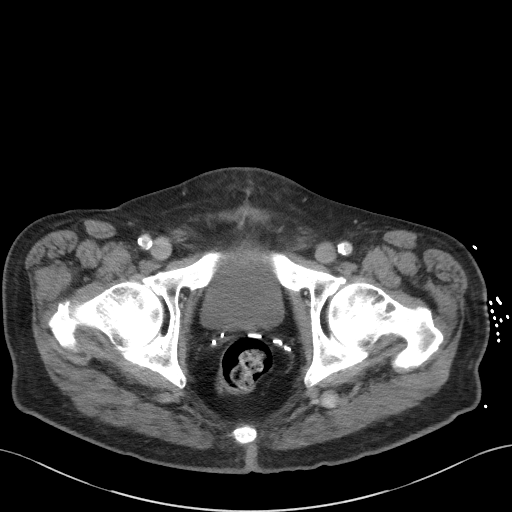
[im 36/101  soft-tissue]
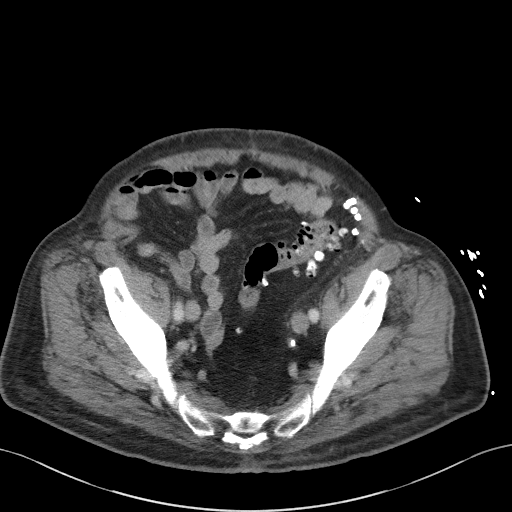
[im 42/101  soft-tissue]
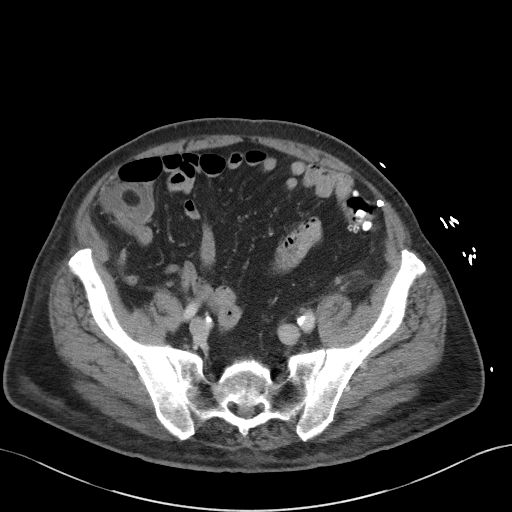
[im 53/101  soft-tissue]
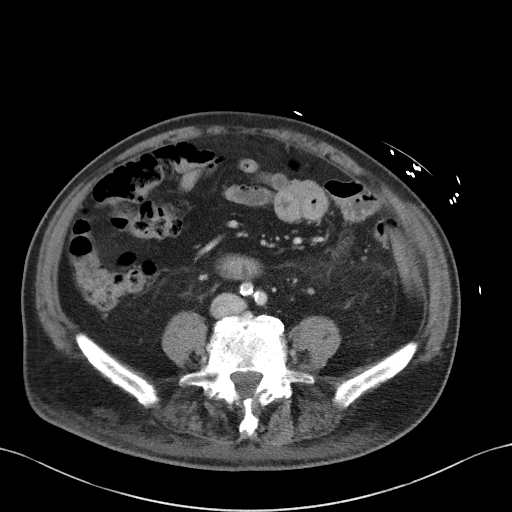
[im 59/101  soft-tissue]
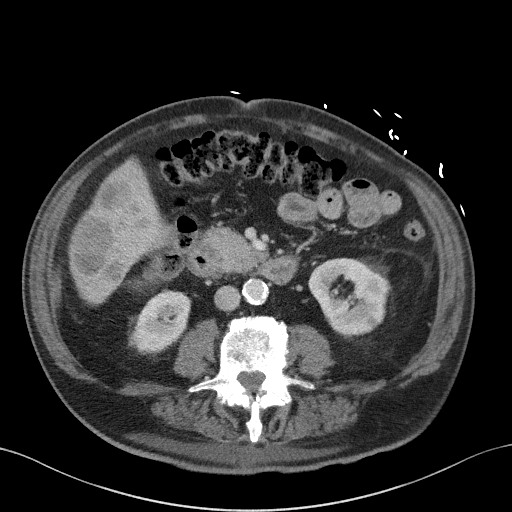
[im 65/101  soft-tissue]
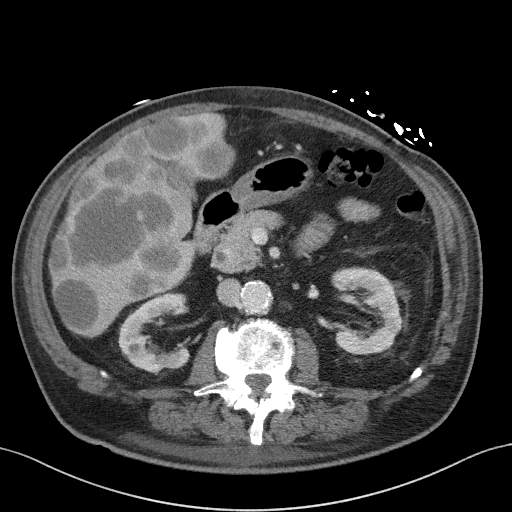
[im 77/101  soft-tissue]
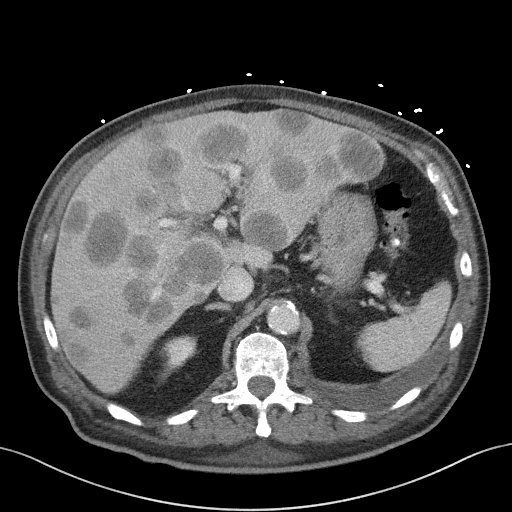
[im 77/101  bone]
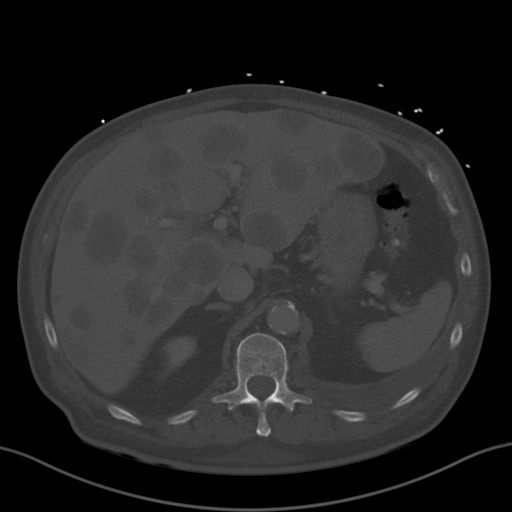
[im 83/101  soft-tissue]
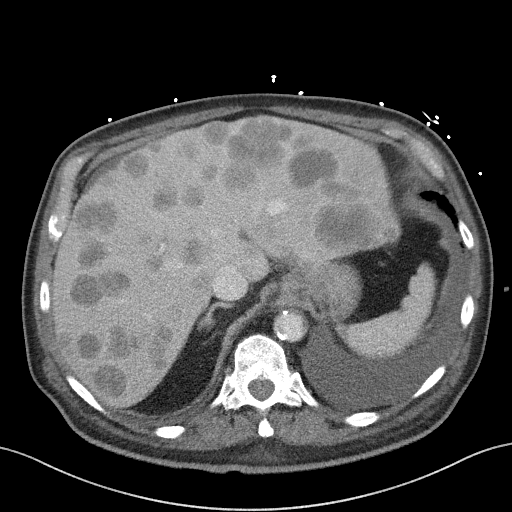
[im 95/101  soft-tissue]
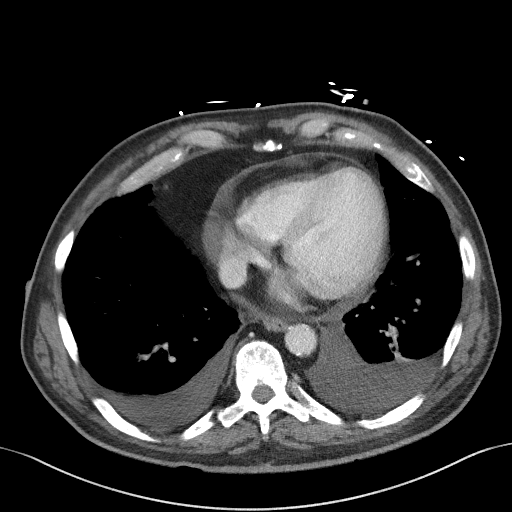

[Series 5: coronal st · coronal · 0.78mm/px · 3 of 95 slices shown]
[im 32/95  soft-tissue]
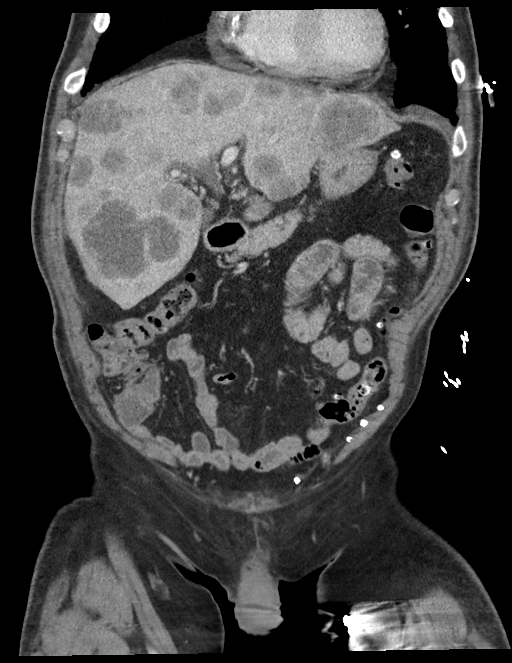
[im 42/95  soft-tissue]
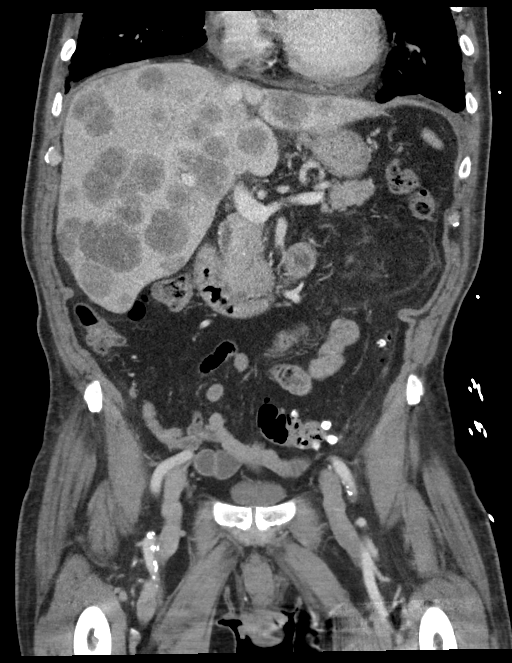
[im 53/95  soft-tissue]
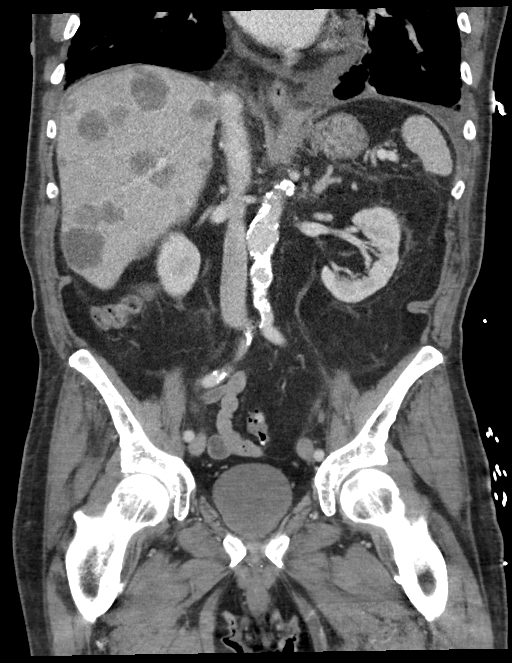

[14 of 46 positions shown; findings below may reference images not displayed]

FINDINGS: Lower chest: There are moderate pleural effusions bilaterally with
bibasilar atelectatic change. There is a fairly small pericardial
effusion. There are foci of coronary artery calcification.

Hepatobiliary: There is widespread hepatic metastatic disease with
lesions seen throughout all lobes in segments of the liver. Lesions
range in size from as small as 8 mm to as large as 5.9 x 5.2 cm cm.
This largest lesion is seen in the posterior segment right lobe.
There is a 5.6 x 5.4 cm lesion in the left lobe of the liver near
the junction with the right lobe. There is no appreciable biliary
duct dilatation. Gallbladder is contracted.

Pancreas: No pancreatic mass or inflammatory focus evident.

Spleen: No splenic lesions are evident.

Adrenals/Urinary Tract: Adrenals bilaterally appear unremarkable.
There are is a cyst in the mid right kidney measuring 0.9 x 0.6 cm.
There is a cyst in the posterior mid left kidney measuring 1.0 x
cm. There are scattered tiny probable cysts elsewhere in the
kidneys. No hydronephrosis evident on either side. There is no renal
or ureteral calculus on either side. Urinary bladder is midline with
wall thickness within normal limits.

Stomach/Bowel: There are multiple sigmoid and descending colonic
diverticula without diverticulitis. There is no appreciable bowel
wall or mesenteric thickening. No evident bowel obstruction. No free
air or portal venous air.

Vascular/Lymphatic: There is atherosclerotic calcification
throughout the aorta and iliac arteries. There is calcification in
major mesenteric arterial vessels proximally without frank
obstruction. There is no appreciable abdominal aortic aneurysm.

There is adenopathy in the porta hepatis region, slightly larger
compared to recent study. Largest lymph node in the porta hepatis
region measures 2.9 x 2.0 cm. There is no other adenopathy seen
elsewhere.

Reproductive: Prostate is absent. No pelvic mass evident. There is
scrotal wall thickening with questionable small hydrocele on the
right.

Other: Appendix appears normal. No abscess or ascites is evident in
the abdomen or pelvis. No omental lesions evident.

Musculoskeletal: There are multiple foci of degenerative change in
the lower thoracic and lumbar regions. Sclerosis in the L2 vertebral
body is stable, likely due to arthropathy. Bony metastatic disease
not felt to be likely in this area. There is no lytic or destructive
bone lesion. No sclerotic appearing metastasis. No intramuscular
lesions evident.
IMPRESSION: 1. Widespread hepatic metastatic disease with multiple lesions
throughout all lobes in segments. There is also porta hepatis region
adenopathy with the largest lymph node in this area slightly larger
compared to recent prior study. Neoplastic involvement in this area
is felt to be present. No omental lesions evident.

2. Widespread left-sided colonic diverticulosis without
diverticulitis. No obvious mass related to bowel evident. No bowel
obstruction or inflammatory change. Appendix appears normal.

3.  Prostate absent.

4. Sizable pleural effusions bilaterally with bibasilar atelectasis.
Effusions appear larger than on recent study. There is a fairly
small pericardial effusion as well.

5. Extensive aortoiliac atherosclerosis. Calcification noted in
major mesenteric arterial vessels without frank obstruction. There
are foci of coronary artery calcification.

6. Scrotal wall edema in the dependent portion of the scrotum.
Question small right scrotal hydrocele.

Aortic Atherosclerosis ([id number]).
